# Patient Record
Sex: Male | Born: 1950 | Race: Black or African American | Hispanic: No | Marital: Married | Smoking: Current every day smoker
Health system: Southern US, Community
[De-identification: ages and names within clinical notes are randomized; demographics above are authoritative.]

## PROBLEM LIST (undated history)

## (undated) ENCOUNTER — Emergency Department (HOSPITAL_COMMUNITY)

## (undated) DIAGNOSIS — J45909 Unspecified asthma, uncomplicated: Secondary | ICD-10-CM

## (undated) DIAGNOSIS — E785 Hyperlipidemia, unspecified: Secondary | ICD-10-CM

## (undated) DIAGNOSIS — R001 Bradycardia, unspecified: Secondary | ICD-10-CM

## (undated) DIAGNOSIS — I10 Essential (primary) hypertension: Secondary | ICD-10-CM

## (undated) DIAGNOSIS — I2510 Atherosclerotic heart disease of native coronary artery without angina pectoris: Secondary | ICD-10-CM

## (undated) DIAGNOSIS — I214 Non-ST elevation (NSTEMI) myocardial infarction: Secondary | ICD-10-CM

## (undated) DIAGNOSIS — Z9289 Personal history of other medical treatment: Secondary | ICD-10-CM

## (undated) DIAGNOSIS — Z8249 Family history of ischemic heart disease and other diseases of the circulatory system: Secondary | ICD-10-CM

## (undated) DIAGNOSIS — Z720 Tobacco use: Secondary | ICD-10-CM

## (undated) DIAGNOSIS — G709 Myoneural disorder, unspecified: Secondary | ICD-10-CM

## (undated) DIAGNOSIS — I639 Cerebral infarction, unspecified: Secondary | ICD-10-CM

## (undated) HISTORY — DX: Tobacco use: Z72.0

## (undated) HISTORY — DX: Essential (primary) hypertension: I10

## (undated) HISTORY — DX: Personal history of other medical treatment: Z92.89

## (undated) HISTORY — DX: Hyperlipidemia, unspecified: E78.5

## (undated) HISTORY — DX: Non-ST elevation (NSTEMI) myocardial infarction: I21.4

## (undated) HISTORY — DX: Family history of ischemic heart disease and other diseases of the circulatory system: Z82.49

---

## 2000-07-04 ENCOUNTER — Encounter: Payer: Self-pay | Admitting: Internal Medicine

## 2000-07-04 ENCOUNTER — Ambulatory Visit (HOSPITAL_COMMUNITY): Admission: RE | Admit: 2000-07-04 | Discharge: 2000-07-04 | Payer: Self-pay | Admitting: Internal Medicine

## 2000-08-12 ENCOUNTER — Ambulatory Visit (HOSPITAL_COMMUNITY): Admission: RE | Admit: 2000-08-12 | Discharge: 2000-08-12 | Payer: Self-pay | Admitting: Internal Medicine

## 2000-08-12 ENCOUNTER — Encounter: Payer: Self-pay | Admitting: Internal Medicine

## 2001-04-12 HISTORY — PX: CARDIAC CATHETERIZATION: SHX172

## 2001-06-19 ENCOUNTER — Encounter: Payer: Self-pay | Admitting: Cardiovascular Disease

## 2001-06-19 ENCOUNTER — Ambulatory Visit (HOSPITAL_COMMUNITY): Admission: RE | Admit: 2001-06-19 | Discharge: 2001-06-20 | Payer: Self-pay | Admitting: Cardiovascular Disease

## 2001-06-19 HISTORY — PX: CORONARY ANGIOPLASTY WITH STENT PLACEMENT: SHX49

## 2001-09-15 ENCOUNTER — Encounter (HOSPITAL_COMMUNITY): Admission: RE | Admit: 2001-09-15 | Discharge: 2001-10-15 | Payer: Self-pay | Admitting: *Deleted

## 2001-09-22 ENCOUNTER — Encounter: Payer: Self-pay | Admitting: Emergency Medicine

## 2001-09-22 ENCOUNTER — Inpatient Hospital Stay (HOSPITAL_COMMUNITY): Admission: EM | Admit: 2001-09-22 | Discharge: 2001-09-23 | Payer: Self-pay | Admitting: Cardiovascular Disease

## 2001-09-23 HISTORY — PX: CARDIAC CATHETERIZATION: SHX172

## 2001-10-17 ENCOUNTER — Encounter (HOSPITAL_COMMUNITY): Admission: RE | Admit: 2001-10-17 | Discharge: 2001-11-16 | Payer: Self-pay | Admitting: *Deleted

## 2001-11-17 ENCOUNTER — Encounter (HOSPITAL_COMMUNITY): Admission: RE | Admit: 2001-11-17 | Discharge: 2001-12-17 | Payer: Self-pay | Admitting: *Deleted

## 2001-12-19 ENCOUNTER — Encounter (HOSPITAL_COMMUNITY): Admission: RE | Admit: 2001-12-19 | Discharge: 2002-01-18 | Payer: Self-pay | Admitting: *Deleted

## 2002-04-28 ENCOUNTER — Encounter: Payer: Self-pay | Admitting: *Deleted

## 2002-04-28 ENCOUNTER — Ambulatory Visit (HOSPITAL_COMMUNITY): Admission: RE | Admit: 2002-04-28 | Discharge: 2002-04-28 | Payer: Self-pay | Admitting: Pediatrics

## 2002-06-07 ENCOUNTER — Inpatient Hospital Stay (HOSPITAL_COMMUNITY): Admission: EM | Admit: 2002-06-07 | Discharge: 2002-06-09 | Payer: Self-pay | Admitting: Emergency Medicine

## 2002-06-08 ENCOUNTER — Encounter: Payer: Self-pay | Admitting: *Deleted

## 2002-09-17 HISTORY — PX: CARDIAC CATHETERIZATION: SHX172

## 2003-02-24 ENCOUNTER — Emergency Department (HOSPITAL_COMMUNITY): Admission: EM | Admit: 2003-02-24 | Discharge: 2003-02-24 | Payer: Self-pay | Admitting: Emergency Medicine

## 2003-05-04 ENCOUNTER — Ambulatory Visit (HOSPITAL_COMMUNITY): Admission: RE | Admit: 2003-05-04 | Discharge: 2003-05-04 | Payer: Self-pay | Admitting: Internal Medicine

## 2005-02-08 ENCOUNTER — Encounter (HOSPITAL_COMMUNITY): Admission: RE | Admit: 2005-02-08 | Discharge: 2005-02-08 | Payer: Self-pay | Admitting: Pediatrics

## 2006-03-29 HISTORY — PX: TRANSTHORACIC ECHOCARDIOGRAM: SHX275

## 2006-04-21 ENCOUNTER — Emergency Department (HOSPITAL_COMMUNITY): Admission: EM | Admit: 2006-04-21 | Discharge: 2006-04-21 | Payer: Self-pay | Admitting: Emergency Medicine

## 2007-06-17 ENCOUNTER — Emergency Department (HOSPITAL_COMMUNITY): Admission: EM | Admit: 2007-06-17 | Discharge: 2007-06-17 | Payer: Self-pay | Admitting: Emergency Medicine

## 2008-07-13 DIAGNOSIS — Z9289 Personal history of other medical treatment: Secondary | ICD-10-CM

## 2008-07-13 HISTORY — DX: Personal history of other medical treatment: Z92.89

## 2009-09-07 ENCOUNTER — Ambulatory Visit (HOSPITAL_COMMUNITY): Admission: RE | Admit: 2009-09-07 | Discharge: 2009-09-07 | Payer: Self-pay | Admitting: Cardiology

## 2009-09-09 ENCOUNTER — Ambulatory Visit (HOSPITAL_COMMUNITY): Admission: RE | Admit: 2009-09-09 | Discharge: 2009-09-09 | Payer: Self-pay | Admitting: Cardiovascular Disease

## 2009-09-09 HISTORY — PX: CARDIAC CATHETERIZATION: SHX172

## 2009-10-24 ENCOUNTER — Ambulatory Visit (HOSPITAL_COMMUNITY): Admission: RE | Admit: 2009-10-24 | Discharge: 2009-10-24 | Payer: Self-pay | Admitting: Internal Medicine

## 2010-06-30 NOTE — Cardiovascular Report (Signed)
[place]  Patient:    Robert, Shaw Visit Number: [id number] MRN: [id number]          Service Type: CAT Location: 6500 [id number] Attending Physician:  Virgina Evener Dictated by:   Lennette Bihari, M.D. Proc. Date: 06/19/01 Admit Date:  06/19/2001 Discharge Date: 06/20/2001   CC:         Netta Cedars, M.D.  Tesfaye D. Felecia Shelling, M.D., [place], [place]  [facility], [address] [address]   Cardiac Catheterization  INDICATIONS:  [name] is a 60 year old gentleman who had initially undergone cardiac catheterization in [place] in early March 2003.  He has subsequently been seen by Dr. Domingo Sep.  The patient was found to have multivessel disease but had high-grade circumflex as well as intermedius disease.  There was some initial plan to undergo Cardiolite imaging for further evaluation of ischemia.  The patient has developed recurrent episodes of chest pain despite increasing medications.  For this reason he is now referred for repeat catheterization and possible intervention.  DESCRIPTION OF PROCEDURE:  After premedication with Versed 2 mg initially intravenously, the patient was prepped and draped in the usual fashion.  The right femoral artery was punctured anteriorly and a #6 French sheath was inserted.  Diagnostic catheterization was done with #6 French Judkins #4 left and right coronary catheters.  A #6 French pigtail catheter was used for biplane cine left ventriculography.  With the demonstration of high-grade significant circumflex disease as well as diagonal/intermedius-like vessel with significant stenosis, despite disease in the LAD and RCA which was not felt to be real high-grade in those other vascular territories, the decision was made to attempt coronary intervention in the circumflex and intermedius/ diagonal-type vessel.  The arterial sheath was upgraded to a #7 [place]  system. Weight-adjusted heparin, 3500 units, was administered and the patient received double bolus Integrilin for glycoprotein IIb/IIIa inhibition therapy. Attention was first directed at the left circumflex coronary artery.  An FL-3.5 Voda guide was used.  A BMW wire was advanced down the circumflex vessel after documentation of therapeutic anticoagulation.  Primary stenting was done with a 3.0 x 15-mm Zeta stent successfully with dilatation up to 13-14 atmospheres.  Scouty angiography was then performed which revealed an excellent angiographic result.  Attention was then directed at the high diagonal/intermedius-type vessel which arose off the LAD.  This vessel immediately bifurcated into a small superior branch.  The superior branch had segmental 90% stenoses but was very small caliber.  The major portion of the intermedius/diagonal vessel was moderate sized and there was segmental disease proximally with stenosis up to 90% in the mid segment and 70% distally.  This vessel extended to the apex.  The wire was advanced down this main portion of the intermedius/diagonal-type vessel. Initial dilatation was done with a 2.5 x 15-mm CrossSail balloon, both at the mid site and also distally.  Stenting was then performed at the mid site with a 2.5 x 13-mm Pixel stent dilated to 13 atmospheres.  Scouty angiography confirmed an excellent angiographic result in this area.  Numerous attempts were then made at trying to navigate the wire into the superior branch which arose at a 90-degree angle immediately after the diagonal takeoff from the LAD.  Multiple wires were attempted and ultimately, this was not successfully navigated and consequently, dilatation was not done in this superior branch. It was felt that this was small caliber and the major diagonal conduit  was open.  The arterial sheath was then sutured in place.  ACT was documented to be therapeutic.  During the procedure, the patient did  receive several doses of intracoronary nitroglycerin.  He returned to his room in stable condition.  HEMODYNAMIC DATA: 1. Central aortic pressure 113/64. 2. Left ventricular pressure 113/16.  ANGIOGRAPHIC DATA: 1. The left main coronary artery was a large vessel that bifurcated into an    LAD and left circumflex system.  2. The LAD was a large caliber vessel.  In the proximal portion of the LAD,    the vessel gave rise to a first diagonal vessel.  This first diagonal    vessel had an intermedius-like distribution and immediately bifurcated into    a smaller sized superior branch just after the LAD takeoff and this arose    from a 90-degree angle.  The main diagonal vessel had mild narrowing    proximally and then was 90% stenosed in the mid segment with 70% stenosis    distally.  The superior branch had 90% stenosis followed by 70% stenosis.    Again, it was a small caliber vessel.  The second diagonal vessel was very    small caliber, approximately 1.5-mm size and had diffuse 60-70% eccentric    stenoses.  The mid LAD after the third diagonal vessel had 20% narrowing    followed by 30%.  There was 40-50% mid distal LAD narrowing.  The LAD    wrapped around the apex and had focal 90% stenosis in a very small    diminutive vessel.  3. The circumflex vessel was large caliber and had 90% stenosis proximally.    This vessel supplied 1 marginal vessel.  4. The right coronary artery was a moderate sized vessel that had 30-40%    proximal stenosis.  In the mid segment between 2 anterior RV marginal    branches, there did appear to be narrowing of up to 70%.  The right    coronary artery beyond the crux had 40% stenosis followed by another area    of 30% stenosis.  There was a very small continuation branch which seemed    to also supply the AV node and there was 80% focal stenosis in this small    distal branch after the small PDA takeoff.  5. Biplane cine left ventriculography revealed  preserved global LV function     with focal hypocontractility in the mid inferior wall and in the low to mid    posterolateral wall.  RESULTS:  Following successful two-vessel coronary intervention, the AV groove circumflex stenosis was reduced from 90% to 0% with the 3.0 x 13-mm Zeta stent; and in the main portion of the first diagonal vessel/intermedius distribution, the 90% stenosis following PTCA and stenting with a 2.5 x 13-mm Pixel stent was reduced to 0%.  The distal 70% stenosis where the vessel was small caliber was reduced to less than 30% by PTCA.  IMPRESSION: 1. Normal global left ventricular function with mild inferior to mid    posterolateral hypocontractility. 2. Multivessel coronary obstructive disease with the left anterior descending    artery giving rise to a first diagonal/intermedius-like distribution vessel    that had 90% mid stenosis and 70% distal stenosis as well as a superior    branch with segmental 90% and 70% stenosis; 60-70% eccentric stenosis in a    small diminutive second diagonal vessel; 20%, 30%, and 40% mid left    anterior descending artery stenosis with  50% mid distal stenosis, and 90%    apical left anterior descending artery stenosis; 90% focal proximal    circumflex stenosis on a bend in the vessel; 30-40% proximal right coronary    artery stenosis with focal 70% mid stenosis as well as 40% and 30% distal    stenoses with evidence for 80% stenosis in the diminutive small distal    branch. 3. Successful two-vessel coronary intervention with primary stenting of the    proximal AV groove circumflex utilizing a 3.0 x 15-mm Zeta stent with a 90%    stenosis being reduced to 0% and successful percutaneous transluminal    coronary angioplasty/stenting of the mid diagonal vessel (2.5 x 13-mm Pixel    stent) with the stenosis being reduced from 90% to 0%, percutaneous    transluminal coronary angioplasty of the distal intermedius vessel from    70% to  less than 30%, done with double bolus Integrilin/weight-adjusted    heparinization.Dictated by:   Lennette Bihari, M.D. Attending Physician:  Virgina Evener DD:  06/19/01 TD:  06/21/01 Job: [id number]

## 2010-06-30 NOTE — Discharge Summary (Signed)
[place]  Patient:    [name], [name] Visit Number: [id number] MRN: [id number]          Service Type: CAT Location: 6500 [id number] [id number] Attending Physician:  Virgina Evener Dictated by:   Leeanne Rio, P.A. Admit Date:  06/19/2001 Discharge Date: 06/20/2001   CC:         Robert Shaw, M.D.   Discharge Summary  Robert Shaw was admitted on Jun 19, 2001, for elective intervention of his coronary arteries.  He has known coronary artery disease and has been experiencing episodes of unstable angina two to three times a week and because of that was unable to return to his work and he was brought to the shortstay admission center for elective coronary angiography with intervention which was performed by Lennette Bihari, M.D.  PROCEDURE:  On Jun 19, 2001, cardiac catheterization with two vessel TCI was performed by Dr. Tresa Endo.  During this intervention, the patient had a Zeta stent placed to the circumflex artery with reduction of the proximal stenotic lesion from 90% to 0 and also he had a stent placed into the diagonal artery with lesion being reduced from 90% to 0%.  I do not have report of the cardiac catheterization and intervention available right now, but this data is from diagram sketched in the chart done by Dr. Tresa Endo.  The patient still has residual disease in his left anterior descending and disease in the RCA territory with scattered in the proximal area 30 to 40% stenosis, in the midportion 70% stenosis, and a small lesion with 80% stenosis in the distal RCA.  COMPLICATIONS:  None.  CONSULTING PHYSICIANS:  None.  LABORATORY DATA:  BMP showed sodium 138, potassium 3.8, chloride 105, CO2 26, glucose 113, BUN 7, creatinine 1.1.  CBC showed white blood cell count 8.4, hematocrit 41.2, and hemoglobin 14.4 with platelet count 182.  The patient discharged home the next day in stable condition.  Groin site stable with no complications.   He was ambulating with no difficulty.  DISCHARGE DIAGNOSES: 1. Coronary artery disease, status post intervention to circumflex and    diagonal arteries.  For a more detailed report of this procedure, please    refer to the dictation of that day which is not available to me right now. 2. Hypertension, well controlled. 3. Hyperlipidemia.  DISCHARGE MEDICATIONS: 1. Enteric-coated aspirin 81 mg q.d. 2. Imdur 30 mg q.d. 3. Atenolol 25 mg q.d. 4. Altace 2.5 mg q.d. 5. Plavix 75 mg q.d. 6. Zocor 40 mg q.d. 7. Nitroglycerin 0.4 mg p.r.n.  ACTIVITY:  No driving, no lifting greater than 5 pounds, no strenuous activity for three days.  DIET:  Low fat, low cholesterol diet.  DISCHARGE INSTRUCTIONS:  The patient was allowed to shower and instructed to report any bleeding, swelling, or oozing from the groin puncture site to our office, number was provided.  FOLLOW-UP:   Stress Cardiolite test will be scheduled in our office on June 19, at 9:15 to assess the ischemia in the RCA territory.  Follow-up appointment with Dr. Domingo Sep was scheduled on May 29, at 3:15 in [place], [place]. Dictated by:   Leeanne Rio, P.A. Attending Physician:  Virgina Evener DD:  06/20/01 TD:  06/23/01 Job: [id number]

## 2010-06-30 NOTE — Procedures (Signed)
NAME:  Robert Shaw, Robert Shaw NO.:  [id number]   MEDICAL RECORD NO.:  [id number]          PATIENT TYPE:  OUT   LOCATION:  RAD                           FACILITY:  [facility]   PHYSICIAN:  Dani Gobble, MD       DATE OF BIRTH:  Jul 25, 1950   DATE OF PROCEDURE:  DATE OF DISCHARGE:                                    STRESS TEST   INDICATIONS:  Known CAD.   ELECTROCARDIOGRAM/HEMODYNAMIC DATA:  Baseline blood pressure 132/78 mmHg.  The pulse was 58 beats per minute.  __________ 12-lead EKG revealed sinus  bradycardia with biphasic P-waves in V3 through V4.  The patient walked for  8 minutes 8 seconds on a full Bruce protocol.  He did not quite achieve his  target heart rate but was quite close with his projected heart rate at 142  beats per minute and peak heart rate 141 beats per minute.  Peak blood  pressure 178/80 mmHg.  EKG at peak heart rate reveals sinus tachycardia with  scooping 1/2-mm ST depression in the inferior leads and similarly in V3  through V5.  Heart rate during recovery decreased quite rapidly.  The ST-  changes required approximately 2-3 minutes for return to baseline.  Of note,  he had pseudonormalization of his T-waves in V3 through V4 with exercise.  He experienced no chest discomfort during the procedure but did note  shortness of breath, dyspnea on exertion and lower extremity discomfort  somewhat suggestive of claudication.   IMPRESSION:  1.  Clinically negative for angina.  2.  EKG negative for ischemia.  3.  Scintigraphic images are pending.  4.  Borderline hypertensive response to exercise.           ______________________________  Dani Gobble, MD     AB/MEDQ  D:  02/08/2005  T:  02/08/2005  Job:  [id number]   cc:   Tesfaye D. Felecia Shelling, MD  Fax: [phone]

## 2010-06-30 NOTE — Cardiovascular Report (Signed)
NAME:  Robert Shaw, Robert Shaw                          ACCOUNT NO.:  [id number]   MEDICAL RECORD NO.:  [id number]                   PATIENT TYPE:  INP   LOCATION:  2905                                 FACILITY:  [facility]   PHYSICIAN:  Cristy Hilts. Jacinto Halim, M.D.                  DATE OF BIRTH:  06-24-1950   DATE OF PROCEDURE:  DATE OF DISCHARGE:  09/23/2001                              CARDIAC CATHETERIZATION   PROCEDURE PERFORMED:  1. Left ventriculography.  2. Selective right and left coronary arteriography.   INDICATIONS:  The patient is a 60 year old gentleman with a history of  coronary artery disease, status post PTCA and stenting of the circumflex and  diagonal #1/intermediate coronary artery. On Jun 19, 2001, he was admitted  through the emergency room with chest pain suggestion of progressive angina  pectoris. Given his severe coronary artery disease, he was brought to the  cardiac catheterization laboratory to evaluate his coronary anatomy.   HEMODYNAMIC DATA:  1. The left ventricular pressures 110 with an end-diastolic pressure of 12     mmHg.  2. The aortic pressure 100/61 with a mean of 77 mmHg.  There was no pressure     gradient across the aortic valve.   LEFT VENTRICULOGRAM:  The left ventricular systolic function was normal and  EF was estimated at 55-60%.  There was no mitral regurgitation.   CORONARY ANGIOGRAPHY:  1. Right coronary artery:  The right coronary artery is a large caliber     vessel and a dominant vessel. The mid segment has about 50% stenosis. The     right coronary artery bifurcates into PDA and a smaller PLV branch. The     smaller PLV branch has ostial 60-70% stenosis.  2. Left main coronary artery:  The left main coronary artery is a large     caliber vessel. It is normal.  3. Circumflex coronary artery:  The circumflex coronary artery is a large     caliber vessel. In the mid segment, the previously placed 2.0 x 15 mm     (Jun 19, 2001) stent is widely  patent. Otherwise no significant stenosis     is noted in the circumflex coronary artery.  4. Left anterior descending artery:  Left anterior descending artery is a     large caliber vessel in the proximal segment. It gives origin to a very     large diagonal #1. Previously placed diagonal #1 stent in the mid segment     is widely patent. This is a 2.5 x 13 mm stent inserted on Jun 19, 2001.  5. The diagonal #2, diagonal #3, and diagonal #4 vessels, which are very     small 1.0 to 1.5 mm vessels have severe diffuse disease.  The diagonal #2     has 80-90% mid segment stenosis and a secondary branch which is diagonal     #2  has mid 80% stenosis. The diagonal #3 has ostial 60-70% stenosis.  6. The distal LAD has 99% stenosis, however, the vessel size here is only     about 1.0 to 1.5 mmHg.   IMPRESSION:  1. 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 No significant     change in the coronary anatomy.  2. Widely patent stents which were placed on Jun 19, 2001.   RECOMMENDATIONS:  Continue medical therapy advise. Continue risk factor  modification as indicated. Smoking cessation is indicated. His nitroglycerin  will be increased. If he continues to have recurrent angina, [facility] as an  outpatient can be considered.   TECHNIQUE OF PROCEDURE:  Under the usual sterile precautions, using a 6  French right femoral artery access,  6 [place] multipurpose B2 catheter was  advanced into the ascending aorta over a 0.035 mm J wire. The catheter was  gently advanced in the left ventricle and left ventricular pressures were  monitored. Hand contrast injection of the left ventricular was performed,  both in LAO and RAO projections. Then the catheter crushed and then pulled  back into the ascending aorta and pressure gradient the aortic valve was  monitored.  The right coronary artery was selectively engaged and  angiography was performed.  Then the catheter was pulled out of the body  over the  0.035 inch J wire. A 6 French Judkins left diagnostic catheter was  advanced down the ascending aorta over a 0.035 inch J wire. The left main  coronary artery was selectively engaged and angiography was performed. After  performing the angiograms, the angiograms were carefully reviewed. Then the  catheter was pulled out of the body.  The patient was transferred to the  recovery room in stable condition. The patient tolerated the procedure well.                                                  Cristy Hilts. Jacinto Halim, M.D.    Pilar Plate  D:  09/22/2001  T:  09/26/2001  Job:  [id number]   cc:   Sherral Hammers, M.D.   Tesfaye D. Felecia Shelling, M.D.   [facility]

## 2010-06-30 NOTE — H&P (Signed)
NAME:  Robert Shaw, Robert Shaw                          ACCOUNT NO.:  [id number]   MEDICAL RECORD NO.:  [id number]                   PATIENT TYPE:  INP   LOCATION:  2905                                 FACILITY:  [facility]   PHYSICIAN:  Abelino Derrick, P.A.                DATE OF BIRTH:  29-Apr-1950   DATE OF ADMISSION:  09/22/2001  DATE OF DISCHARGE:  09/23/2001                                HISTORY & PHYSICAL   CHIEF COMPLAINT:  Chest pain.   HISTORY OF PRESENT ILLNESS:  The patient is a 60 year old with a history of  coronary artery disease. He is followed by Dr. Felecia Shelling in [place] and Dr.  Domingo Sep. He had a DMI while in [place] in March of 2003. He was  cathed then and treated medically secondary to small culprit vessels. He had  recurrent chest pain and was cathed by Dr. Tresa Endo in May of 2003 and at that  time, had a ramus intermedius and circumflex Stent. Follow-up Cardiolite  study done on July 31, 2001 was negative for ischemia with an EF of 61%. He  has been doing well, going to rehabilitative services in [place]. He has  noticed some increasing grabbing left chest pain for the last few weeks.  Symptoms are not exertional. He has no associated nausea, vomiting, or  diaphoresis but does have some associated right arm pain. He did have some  discomfort today at cardiac rehabilitation and was sent to [facility]  emergency room. He is transferred now for further evaluation. He was treated  at [facility] with IV Heparin and Nitroglycerin.   PAST MEDICAL HISTORY:  Remarkable for hyperlipidemia, hypertension, and  polyps.   CURRENT MEDICATIONS:  1. Zocor 20 mg per day.  2. Aspirin daily.  3. Voltex daily.  4. Imdur 30 mg daily.  5. Plavix 75 mg daily.  6. Zyban daily.  7. Atenolol 25 mg daily.   ALLERGIES:  No known drug allergies.   SOCIAL HISTORY:  He is married. He sells life insurance for a living. He has  not worked since he had his heart attach in March. He has one  child and  three grandchildren. Smokes less than one half pack per day.   FAMILY HISTORY:  Remarkable for coronary artery disease. His father had an  myocardial infarction in his 77's and died in his 53's. He has two brothers  with coronary artery disease, one in his 86's and one in his 28's.   REVIEW OF SYSTEMS:  Negative for chills, fever, peptic ulcer disease, or  previous GI bleeding.   PHYSICAL EXAMINATION:  VITAL SIGNS: Blood pressure 112/72, pulse 54,  temperature 96.9.  GENERAL: A well developed, well nourished  male in no acute distress.  HEENT: Normocephalic. Extraocular muscles intact. Sclera nonicteric.  Conjunctiva within normal limits.  NECK: Without bruits or jugular venous distention.  CHEST: Feels some rhonchi on the  right, otherwise clear.  CARDIAC: Regular rate and rhythm.  No murmur, rub, or gallop. Normal S1 and  S2. Positive S4.  ABDOMEN: Nontender. No hepatosplenomegaly.  EXTREMITIES: Without edema. Pulses are 3+/4 bilaterally.  NEURO: Examination is grossly intact. He is awake, alert, and oriented.  Cooperative.  SKIN: Warm and dry.   IMPRESSION:  1. Atypical chest pain, rule out progression of coronary artery disease.  2. Known coronary artery disease, two vessel PCI in March of 2003 with     subsequent negative Cardiolite study.  3. History of smoking.  4. Hypertension, currently controlled.  5. Treated hyperlipidemia.  6. Family history of coronary artery disease.   PLAN:  The patient was seen by Dr. Nadara Eaton and myself today. He will be set up  for diagnostic catheterization this afternoon.                                                Abelino Derrick, P.A.    LKK/MEDQ  D:  09/22/2001  T:  09/25/2001  Job:  [id number]

## 2010-06-30 NOTE — Discharge Summary (Signed)
NAME:  Robert Shaw, Robert Shaw                          ACCOUNT NO.:  [id number]   MEDICAL RECORD NO.:  [id number]                   PATIENT TYPE:  INP   LOCATION:  2905                                 FACILITY:  [facility]   PHYSICIAN:  Runell Gess, M.D.             DATE OF BIRTH:  1950/11/06   DATE OF ADMISSION:  09/22/2001  DATE OF DISCHARGE:  09/23/2001                                 DISCHARGE SUMMARY   DISCHARGE DIAGNOSES:  1. Unstable angina.  2. Coronary disease, catheterization this admission showing moderate small     vessel disease.  Plan is for continued medical therapy.  3. Good left ventricular function.  4. Known coronary disease with VMI and two vessel PCI April 2003.  5. Controlled hypertension.  6. Treated hyperlipidemia.  7. History of smoking.   HOSPITAL COURSE:  The patient is a 60 year old male followed by Dr. Felecia Shelling  and Dr. Domingo Sep in [place].  He has a history of coronary disease and  had a BMI while at [place] in March of 2003.  He was cathed then and  treated medically secondary to small culprit vessels.  He had recurrent  chest pain and was cathed in May 2003 by Dr. Tresa Endo and at that time had a  circumflex and ramus intermedius stent placed.  Cardiolite study July 31, 2001 was negative for ischemia with an EF of 61%.  He has been going to  rehab in [place] and doing well.  On the day of admission he noted a  grabbing left chest pain and presented to the ER at [facility].  He was  sent there from cardiac rehab in [place].  He was transferred to [place] for  further evaluation.  CK MB and troponins were negative.  Catheterization was  done later on the 11th which revealed 50% mid distal RCA, 70% distal RCA,  left main was normal, circumflex stent site was patent, He had a 99% distal  LAD lesion and previously placed diagonal stent was patent.  He  had an 80-  90% second diagonal narrowing.  EF was 55%.  The plan is for continued  medical therapy.   We increased his nitrates.  He tolerated catheterization  well.  We feel he can be discharged 09/23/01.   DISCHARGE MEDICATIONS:  1. Imdur 60 mg q.d.  2. Coated aspirin q.d.  3. Plavix 75 mg q.d.  4. Folatex once a day.  5. Atenolol 25 mg q.d.  6. Zoloft 50 mg q.d.  7. Zyban 150 mg b.i.d.  8. Nitroglycerin sublingual p.r.n.   LABS:  Renal profile shows a sodium of 136, potassium 3.8, BUN 11,  creatinine 1.2.  CK MB and troponin are negative.  INR is 1.1.  White count  6.3, hemoglobin 13.2, hematocrit 38.3, platelet count 181.  EKG showed sinus  rhythm, sinus bradycardia, without acute changes.    DISPOSITION:  The patient is  discharged in stable condition and will follow  up with Dr. Domingo Sep.  He was counseled on smoking before discharge.  There  was a question of whether or not we should do another Cardiolite study but  he has just had one done in June that was negative for ischemia.     Abelino Derrick, P.A.                      Runell Gess, M.D.    Lenard Lance  D:  09/23/2001  T:  09/26/2001  Job:  [id number]

## 2010-06-30 NOTE — Discharge Summary (Signed)
   NAME:  Robert Shaw, Robert Shaw                          ACCOUNT NO.:  [id number]   MEDICAL RECORD NO.:  [id number]                   PATIENT TYPE:  INP   LOCATION:  A217                                 FACILITY:  [facility]   PHYSICIAN:  Tesfaye D. Felecia Shelling, M.D.              DATE OF BIRTH:  24-Jul-1950   DATE OF ADMISSION:  06/07/2002  DATE OF DISCHARGE:  06/07/2002                                 DISCHARGE SUMMARY   DISCHARGE DIAGNOSES:  1. Syncopal episode secondary to bradycardia as a result of beta blocker.  2. History of coronary artery disease and status post myocardial infarction.  3. Hypertension.  4. Hyperlipidemia.  5. History of depression disorder.   DISCHARGE MEDICATIONS:  1. Imdur 30 mg p.o. daily.  2. Aspirin 81 mg p.o. daily.  3. Plavix 75 mg p.o. daily.  4. ________ one tablet p.o. daily.  5. Nitroglycerin sublingual 0.4 mg every 5 minutes p.r.n. for chest pain.   HOSPITAL COURSE:  This is a 60 year old black male with a history of  coronary artery disease and a myocardial infarction and hypertension who  came to the emergency room due to dizziness and a brief period of syncope.  He was found to have bradycardia and hypertension on initial evaluation in  the emergency room.  There was no change in his EKG and the cardiac enzymes.  He was admitted under telemetry and was evaluated.  His beta blocker was  stopped.  His Coumadin was also decreased.  Patient was evaluated by his  cardiologist, and he was discharged back home in a stable condition to be  followed as an outpatient.                                               Tesfaye D. Felecia Shelling, M.D.    TDF/MEDQ  D:  06/18/2002  T:  06/18/2002  Job:  [id number]

## 2010-06-30 NOTE — H&P (Signed)
NAME:  Robert Shaw, Robert Shaw                          ACCOUNT NO.:  [id number]   MEDICAL RECORD NO.:  [id number]                   PATIENT TYPE:  INP   LOCATION:  A217                                 FACILITY:  [facility]   PHYSICIAN:  Tesfaye D. Felecia Shelling, M.D.              DATE OF BIRTH:  1950/05/07   DATE OF ADMISSION:  06/07/2002  DATE OF DISCHARGE:                                HISTORY & PHYSICAL   CHIEF COMPLAINT:  Dizziness.   HISTORY OF PRESENT ILLNESS:  This is a 60 year old male patient with history  of coronary artery disease and status post myocardial infarction and  hypertension.  He came to the emergency room with the above complaints.  The  patient has been having dizziness whenever he was trying to get up out of  bed.  At one point he was almost passed out.  The day before these symptoms  he was having recurrent chest pain.  There was no nausea or vomiting. The  patient was evaluated in the emergency room where he was found to have  orthostatic hypertension and bradycardia. His cardiac enzymes and EKGs were  negative for any acute change.  The the patient was given a bolus of fluid  and then he was admitted into telemetry.   REVIEW OF SYSTEMS:  No headache, fever, shortness of breath, palpitations,  nausea, vomiting, abdominal pain, dysuria, urgency, or frequency of  urination.   PAST MEDICAL HISTORY:  1. Coronary artery disease and status post MI.  2. Hypertension.  3. Hyperlipidemia.  4. History of depression disorder.   CURRENT MEDICATIONS:  1. Atenolol 25 mg p.o. daily.  2. Imdur 90 mg p.o. daily.  3. Ecotrin 81 mg p.o. daily.  4. Zocor 40 mg p.o. daily.  5. Plavix 75 mg p.o. daily.  6. Fortex 1 tablet p.o. daily.   PERSONAL AND SOCIAL HISTORY:  The patient is married.  He has stopped  smoking in the last 6 months time.  No history of alcohol or substance  abuse.   PHYSICAL EXAMINATION:  GENERAL:  The patient is alert, awake, and  chronically sick looking.  VITAL  SIGNS:  Blood pressure 83/47, pulse 43, respiratory rate 18,  temperature 97.7 degrees F.  HEENT:  Pupils are equal and reactive.  NECK:  Supple.  CHEST:  Clear lung fields with good air entry.  CARDIOVASCULAR:  First and second heart sounds heard, bradycardic.  No  masses noted or organomegaly.  EXTREMITIES:  No leg edema.   LABORATORY DATA:  Labs on admission, WBC 7.8, hemoglobin 13.4, hematocrit  33.6, and platelets 177.  Sodium 138, potassium 4.2, chloride 107, CO2 27,  glucose 103, BUN 9, creatinine 1.1, calcium 8.9, total CPK 185.  CK/MB 1.9,  and troponin 0.01.   ASSESSMENT:  1. Presyncopal episode probably secondary to a combination of orthostatic     hypotension and bradycardia which could be secondary to atenolol.  2. History of coronary artery disease status post myocardial infarction.  3. Hypertension which is well controlled.  4. History of hyperlipidemia.  5. History of depression disorder.   PLAN:  We will give the patient a bolus of fluid. We will continue him on IV  fluid. We will do cardiac enzymes and EKG.  Then we will need further  cardiology evaluation.                                               Tesfaye D. Felecia Shelling, M.D.    TDF/MEDQ  D:  06/08/2002  T:  06/08/2002  Job:  [id number]

## 2010-11-08 LAB — DIFFERENTIAL
Eosinophils Absolute: 0
Lymphocytes Relative: 22
Lymphs Abs: 1.6
Monocytes Relative: 3
Neutrophils Relative %: 74

## 2010-11-08 LAB — RAPID URINE DRUG SCREEN, HOSP PERFORMED
Amphetamines: NOT DETECTED
Barbiturates: NOT DETECTED
Benzodiazepines: NOT DETECTED
Opiates: NOT DETECTED

## 2010-11-08 LAB — PROTIME-INR: INR: 1

## 2010-11-08 LAB — CBC
MCHC: 35.3
RDW: 13.4

## 2010-11-08 LAB — COMPREHENSIVE METABOLIC PANEL
ALT: 19
Calcium: 9
Creatinine, Ser: 1.07
GFR calc Af Amer: >60
Glucose, Bld: 107 — ABNORMAL HIGH
Sodium: 139
Total Protein: 6.3

## 2010-11-08 LAB — URINALYSIS, ROUTINE W REFLEX MICROSCOPIC
Glucose, UA: NEGATIVE
Hgb urine dipstick: NEGATIVE
Specific Gravity, Urine: 1.01
pH: 7

## 2010-11-08 LAB — APTT: aPTT: 30

## 2012-03-14 ENCOUNTER — Encounter (HOSPITAL_COMMUNITY): Payer: Self-pay | Admitting: *Deleted

## 2012-03-14 ENCOUNTER — Emergency Department (HOSPITAL_COMMUNITY): Payer: 59

## 2012-03-14 ENCOUNTER — Inpatient Hospital Stay (HOSPITAL_COMMUNITY)
Admission: EM | Admit: 2012-03-14 | Discharge: 2012-03-18 | DRG: 247 | Disposition: A | Discharge status: Home / Self Care | Payer: 59 | Attending: Internal Medicine | Admitting: Internal Medicine

## 2012-03-14 DIAGNOSIS — Z7982 Long term (current) use of aspirin: Secondary | ICD-10-CM

## 2012-03-14 DIAGNOSIS — R079 Chest pain, unspecified: Secondary | ICD-10-CM

## 2012-03-14 DIAGNOSIS — F17200 Nicotine dependence, unspecified, uncomplicated: Secondary | ICD-10-CM | POA: Diagnosis present

## 2012-03-14 DIAGNOSIS — I214 Non-ST elevation (NSTEMI) myocardial infarction: Principal | ICD-10-CM | POA: Diagnosis present

## 2012-03-14 DIAGNOSIS — Z7902 Long term (current) use of antithrombotics/antiplatelets: Secondary | ICD-10-CM

## 2012-03-14 DIAGNOSIS — Z79899 Other long term (current) drug therapy: Secondary | ICD-10-CM

## 2012-03-14 DIAGNOSIS — I252 Old myocardial infarction: Secondary | ICD-10-CM

## 2012-03-14 DIAGNOSIS — N182 Chronic kidney disease, stage 2 (mild): Secondary | ICD-10-CM | POA: Diagnosis present

## 2012-03-14 DIAGNOSIS — Z9861 Coronary angioplasty status: Secondary | ICD-10-CM

## 2012-03-14 DIAGNOSIS — I2510 Atherosclerotic heart disease of native coronary artery without angina pectoris: Secondary | ICD-10-CM | POA: Diagnosis present

## 2012-03-14 DIAGNOSIS — I498 Other specified cardiac arrhythmias: Secondary | ICD-10-CM | POA: Diagnosis not present

## 2012-03-14 DIAGNOSIS — R001 Bradycardia, unspecified: Secondary | ICD-10-CM | POA: Diagnosis not present

## 2012-03-14 HISTORY — DX: Bradycardia, unspecified: R00.1

## 2012-03-14 HISTORY — DX: Atherosclerotic heart disease of native coronary artery without angina pectoris: I25.10

## 2012-03-14 LAB — CBC WITH DIFFERENTIAL/PLATELET
Hemoglobin: 13.8 g/dL (ref 13.0–17.0)
Lymphocytes Relative: 41 % (ref 12–46)
Lymphs Abs: 2.7 10*3/uL (ref 0.7–4.0)
MCH: 31.8 pg (ref 26.0–34.0)
Monocytes Relative: 5 % (ref 3–12)
Neutro Abs: 3.4 10*3/uL (ref 1.7–7.7)
Neutrophils Relative %: 52 % (ref 43–77)
Platelets: 167 10*3/uL (ref 150–400)
RBC: 4.34 MIL/uL (ref 4.22–5.81)
WBC: 6.6 10*3/uL (ref 4.0–10.5)

## 2012-03-14 LAB — COMPREHENSIVE METABOLIC PANEL
ALT: 13 U/L (ref 0–53)
Alkaline Phosphatase: 74 U/L (ref 39–117)
BUN: 14 mg/dL (ref 6–23)
CO2: 27 mEq/L (ref 19–32)
Chloride: 103 mEq/L (ref 96–112)
GFR calc Af Amer: 64 mL/min — ABNORMAL LOW (ref >90)
Glucose, Bld: 113 mg/dL — ABNORMAL HIGH (ref 70–99)
Potassium: 3.5 mEq/L (ref 3.5–5.1)
Sodium: 140 mEq/L (ref 135–145)
Total Bilirubin: 0.7 mg/dL (ref 0.3–1.2)

## 2012-03-14 LAB — POCT I-STAT TROPONIN I: Troponin i, poc: 0.1 ng/mL (ref 0.00–0.08)

## 2012-03-14 MED ORDER — ASPIRIN 81 MG PO CHEW
243.0000 mg | CHEWABLE_TABLET | Freq: Once | ORAL | Status: AC
  Administered 2012-03-14: 243 mg via ORAL

## 2012-03-14 MED ORDER — ASPIRIN 81 MG PO CHEW
CHEWABLE_TABLET | ORAL | Status: AC
  Administered 2012-03-14: 243 mg via ORAL
  Filled 2012-03-14: qty 3

## 2012-03-14 MED ORDER — NITROGLYCERIN 0.4 MG SL SUBL
SUBLINGUAL_TABLET | SUBLINGUAL | Status: AC
  Administered 2012-03-14: 0.4 mg
  Filled 2012-03-14: qty 25

## 2012-03-14 MED ORDER — NITROGLYCERIN 2 % TD OINT
0.5000 [in_us] | TOPICAL_OINTMENT | Freq: Four times a day (QID) | TRANSDERMAL | Status: DC
  Administered 2012-03-14 – 2012-03-16 (×8): 0.5 [in_us] via TOPICAL
  Filled 2012-03-14: qty 1
  Filled 2012-03-14: qty 30

## 2012-03-14 NOTE — ED Provider Notes (Signed)
History   Scribed for Carleene Cooper III, MD, the patient was seen in room APA16A/APA16A. This chart was scribed by Lewanda Rife, ED scribe. Patient's care was started at 8:38 pm.   CSN: [id number]  Arrival date & time 03/14/12  2009   First MD Initiated Contact with Patient 03/14/12 2033      Chief Complaint  Patient presents with  . Chest Pain    (Consider location/radiation/quality/duration/timing/severity/associated sxs/prior treatment) HPI Robert Shaw is a 62 y.o. male who presents to the Emergency Department complaining of waxing and waning moderate chest pain radiating down both arms onset this morning. Pt reports 2 episodes of chest pain, the first episode resolved on its own, and second episode started 1 hour prior to arrival. Pt denies a precipitating factor. Pt reports shortness of breath, and "throat tightening." Pt denies fever, change in bowel movements, urinary symptoms, otalgia, sore throat, cough, syncope, nausea, vomiting, and diarrhea. Pt reports taking 1 aspirin today at home with no relief. Pt states nitroglycerin administered in ED has greatly improved symptoms. Pt states nothing makes chest pain worse. Pt reports history stent placement, myocardial infarction, and coronary artery disease. Pt's last cardiac catheterization was on 09/09/09 and demonstrated previously placed stents were widely patent with no significant coronary obstructive disease. Pt was advised to cardiologist to quit smoking, but admits he still smokes cigarettes today. Pt reports family history of coronary artery disease.  Cardiologist: Dr. Rennis Golden  Past Medical History  Diagnosis Date  . Myocardial infarction     Past Surgical History  Procedure Date  . Cardiac stents     History reviewed. No pertinent family history.  History  Substance Use Topics  . Smoking status: Current Every Day Smoker  . Smokeless tobacco: Not on file  . Alcohol Use: No      Review of Systems   Constitutional: Negative.  Negative for fever and diaphoresis.  HENT: Negative.   Respiratory: Positive for shortness of breath. Negative for cough.   Cardiovascular: Positive for chest pain.  Gastrointestinal: Negative.   Musculoskeletal: Negative.   Skin: Negative.   Neurological: Negative.   Hematological: Negative.   Psychiatric/Behavioral: Negative.   All other systems reviewed and are negative.   A complete 10 system review of systems was obtained and all systems are negative except as noted in the HPI and PMH.   Allergies  Review of patient's allergies indicates no known allergies.  Home Medications  No current outpatient prescriptions on file.  BP 180/85  Pulse 65  Temp 97.5 F (36.4 C) (Oral)  Resp 20  Ht 6' (1.829 m)  Wt 168 lb (76.204 kg)  BMI 22.78 kg/m2  SpO2 100%  Physical Exam  Nursing note and vitals reviewed. Constitutional: He is oriented to person, place, and time. He appears well-developed and well-nourished. No distress.  HENT:  Head: Normocephalic and atraumatic.  Eyes: EOM are normal.  Neck: Normal range of motion. Neck supple. No tracheal deviation present.  Cardiovascular: Normal rate, regular rhythm, normal heart sounds and intact distal pulses.   Pulmonary/Chest: Effort normal and breath sounds normal. No respiratory distress.  Abdominal: Soft. Bowel sounds are normal. He exhibits no distension and no mass. There is no tenderness. There is no rebound and no guarding.  Musculoskeletal: Normal range of motion.  Neurological: He is alert and oriented to person, place, and time.       neurolofic   Skin: Skin is warm and dry.  Psychiatric: He has a normal mood and affect.  His behavior is normal.    ED Course  Procedures (including critical care time) 8:45 PM Pt informed and agrees with treatment plan. Pt and family also informed of possible admission.   Medications  aspirin chewable tablet 243 mg (not administered)  aspirin 81 MG chewable  tablet (243 mg  Given 03/14/12 2021)  nitroGLYCERIN (NITROSTAT) 0.4 MG SL tablet (0.4 mg  Given 03/14/12 2022)    9:28 PM Results for orders placed during the hospital encounter of 03/14/12  CBC WITH DIFFERENTIAL      Component Value Range   WBC 6.6  4.0 - 10.5 K/uL   RBC 4.34  4.22 - 5.81 MIL/uL   Hemoglobin 13.8  13.0 - 17.0 g/dL   HCT 95.6 (*) 21.3 - 08.6 %   MCV 88.9  78.0 - 100.0 fL   MCH 31.8  26.0 - 34.0 pg   MCHC 35.8  30.0 - 36.0 g/dL   RDW 57.8  46.9 - 62.9 %   Platelets 167  150 - 400 K/uL   Neutrophils Relative 52  43 - 77 %   Neutro Abs 3.4  1.7 - 7.7 K/uL   Lymphocytes Relative 41  12 - 46 %   Lymphs Abs 2.7  0.7 - 4.0 K/uL   Monocytes Relative 5  3 - 12 %   Monocytes Absolute 0.4  0.1 - 1.0 K/uL   Eosinophils Relative 1  0 - 5 %   Eosinophils Absolute 0.1  0.0 - 0.7 K/uL   Basophils Relative 0  0 - 1 %   Basophils Absolute 0.0  0.0 - 0.1 K/uL  COMPREHENSIVE METABOLIC PANEL      Component Value Range   Sodium 140  135 - 145 mEq/L   Potassium 3.5  3.5 - 5.1 mEq/L   Chloride 103  96 - 112 mEq/L   CO2 27  19 - 32 mEq/L   Glucose, Bld 113 (*) 70 - 99 mg/dL   BUN 14  6 - 23 mg/dL   Creatinine, Ser 5.28 (*) 0.50 - 1.35 mg/dL   Calcium 9.8  8.4 - 41.3 mg/dL   Total Protein 7.2  6.0 - 8.3 g/dL   Albumin 4.2  3.5 - 5.2 g/dL   AST 23  0 - 37 U/L   ALT 13  0 - 53 U/L   Alkaline Phosphatase 74  39 - 117 U/L   Total Bilirubin 0.7  0.3 - 1.2 mg/dL   GFR calc non Af Amer 55 (*) >90 mL/min   GFR calc Af Amer 64 (*) >90 mL/min  POCT I-STAT TROPONIN I      Component Value Range   Troponin i, poc 0.10 (*) 0.00 - 0.08 ng/mL   Comment 3            Dg Chest Portable 1 View  03/14/2012  *RADIOLOGY REPORT*  Clinical Data: Mid to left chest pain.  PORTABLE CHEST - 1 VIEW  Comparison: 09/07/2009  Findings: Normal heart size and pulmonary vascularity.  Slightly shallow inspiration with underlying appearance of hyperinflation, likely due to emphysema. Scattered fibrosis in the  perihilar regions.  Linear atelectasis in the left lung base.  No focal consolidation.  No blunting of costophrenic angles.  No pneumothorax.  Mediastinal contours appear intact.  IMPRESSION: Chronic emphysematous changes and scattered fibrosis.  Linear atelectasis in the left base.  No focal consolidation.   Original Report Authenticated By: Burman Nieves, M.D.     Date: 03/14/2012  Rate: 64  Rhythm:  normal sinus rhythm  QRS Axis: normal  Intervals: normal QRS:  Poor R wave progression in precordial leads suggests old anterior myocardial infarction.  ST/T Wave abnormalities: normal  Conduction Disutrbances:none  Narrative Interpretation: Abnormal EKG  Old EKG Reviewed: unchanged  9:36 PM Pt is chest pain free after sublingual NTG.  Lab workup shows slightly elevated troponin I at 0.10.  Call to Dr. Osvaldo Shipper, [facility] --> refers me to pt's cardiologist.  Call to [facility] --> Dr. Royann Shivers --> [facility] at [facility] --> Dr. Adela Glimpse --> Dr. Osvaldo Shipper, who will see and transfer pt to [facility].       1. Chest pain    I personally performed the services described in this documentation, which was scribed in my presence. The recorded information has been reviewed and is accurate. Osvaldo Human, MD         Carleene Cooper III, MD 03/14/12 [id number]

## 2012-03-14 NOTE — H&P (Signed)
[facility] History and Physical  Robert Shaw:[id number] DOB: 05-07-1950 DOA: 03/14/2012   PCP: Avon Gully, MD  Specialists: Dr. Rennis Golden is his cardiologist  Chief Complaint: Chest pain since earlier tonight   HPI: Robert Shaw is a 62 y.o. male with a past medical history of coronary artery disease, who has had stents placed to the circumflex and diagonal in 2003. He had a cardiac catheterization in 2011, which showed patent stents with other CAD. He was in his usual state of health till 8 PM tonight when he was in his house doing nonexertional activity. He started developing pressure-like sensation in the retrosternal area. The achiness radiated to both the arms. He had shortness of breath but no dizziness. The pain was 8/10 in intensity. Denies any cough. And denies any palpitations, or diaphoresis. Denies any leg swelling. Denies any acid reflux. He took aspirin and cholesterol medications at home, but did not take any nitroglycerin. The pain was severe enough that he decided to come in to the hospital. In the emergency department he was given one nitroglycerin with which his pain is down to 0/10. He is no longer short of breath. In the last 10 minutes he has complained of 2/10 pain, but is not in any discomfort or distress.  Home Medications: Prior to Admission medications   Medication Sig Start Date End Date Taking? Authorizing Provider  aspirin EC 81 MG tablet Take 81 mg by mouth daily.   Yes Historical Provider, MD  atenolol (TENORMIN) 25 MG tablet Take 25 mg by mouth daily.   Yes Historical Provider, MD  clopidogrel (PLAVIX) 75 MG tablet Take 75 mg by mouth daily.   Yes Historical Provider, MD  rosuvastatin (CRESTOR) 10 MG tablet Take 10 mg by mouth daily.   Yes Historical Provider, MD    Allergies: No Known Allergies  Past Medical History: Past Medical History  Diagnosis Date  . Coronary artery disease     Past Surgical History  Procedure Date  . Cardiac stents      Social History:  reports that he has been smoking.  He does not have any smokeless tobacco history on file. He reports that he does not drink alcohol or use illicit drugs.  Living Situation: Lives with his wife. Activity Level: Independent with his daily activities   Family History:  there is history of heart disease in the family, but he did not specify any further.  Review of Systems - History obtained from the patient General ROS: negative Psychological ROS: negative Ophthalmic ROS: negative ENT ROS: negative Allergy and Immunology ROS: negative Hematological and Lymphatic ROS: negative Endocrine ROS: negative Respiratory ROS: as in hpi Cardiovascular ROS: as in hpi Gastrointestinal ROS: no abdominal pain, change in bowel habits, or black or bloody stools Genito-Urinary ROS: no dysuria, trouble voiding, or hematuria Musculoskeletal ROS: negative Neurological ROS: no TIA or stroke symptoms Dermatological ROS: negative  Physical Examination  Filed Vitals:   03/14/12 2016 03/14/12 2044 03/14/12 2122 03/14/12 2200  BP: 180/85 180/85 122/69 126/74  Pulse: 65 62 56 54  Temp: 97.5 F (36.4 C)     TempSrc: Oral     Resp: 20 20 18 17   Height: 6' (1.829 m)     Weight: 76.204 kg (168 lb)     SpO2: 100% 100% 100% 100%    General appearance: alert, cooperative, appears stated age and no distress Head: Normocephalic, without obvious abnormality, atraumatic Eyes: conjunctivae/corneas clear. PERRL, EOM's intact. Throat: lips, mucosa, and tongue normal;  teeth and gums normal Neck: no adenopathy, no carotid bruit, no JVD, supple, symmetrical, trachea midline and thyroid not enlarged, symmetric, no tenderness/mass/nodules Back: symmetric, no curvature. ROM normal. No CVA tenderness. Resp: Few crackles at the bases, which diminished with deep breathing. No rhonchi or wheezing. Cardio: regular rate and rhythm, S1, S2 normal, no murmur, click, rub or gallop GI: soft, non-tender;  bowel sounds normal; no masses,  no organomegaly Extremities: extremities normal, atraumatic, no cyanosis or edema Pulses: 2+ and symmetric Skin: Skin color, texture, turgor normal. No rashes or lesions Lymph nodes: Cervical, supraclavicular, and axillary nodes normal. Neurologic: Alert and oriented X 3, normal strength and tone. No focal deficits  Laboratory Data: Results for orders placed during the hospital encounter of 03/14/12 (from the past 48 hour(s))  CBC WITH DIFFERENTIAL     Status: Abnormal   Collection Time   03/14/12  8:00 PM      Component Value Range Comment   WBC 6.6  4.0 - 10.5 K/uL    RBC 4.34  4.22 - 5.81 MIL/uL    Hemoglobin 13.8  13.0 - 17.0 g/dL    HCT 29.5 (*) 62.1 - 52.0 %    MCV 88.9  78.0 - 100.0 fL    MCH 31.8  26.0 - 34.0 pg    MCHC 35.8  30.0 - 36.0 g/dL    RDW 30.8  65.7 - 84.6 %    Platelets 167  150 - 400 K/uL    Neutrophils Relative 52  43 - 77 %    Neutro Abs 3.4  1.7 - 7.7 K/uL    Lymphocytes Relative 41  12 - 46 %    Lymphs Abs 2.7  0.7 - 4.0 K/uL    Monocytes Relative 5  3 - 12 %    Monocytes Absolute 0.4  0.1 - 1.0 K/uL    Eosinophils Relative 1  0 - 5 %    Eosinophils Absolute 0.1  0.0 - 0.7 K/uL    Basophils Relative 0  0 - 1 %    Basophils Absolute 0.0  0.0 - 0.1 K/uL   COMPREHENSIVE METABOLIC PANEL     Status: Abnormal   Collection Time   03/14/12  8:00 PM      Component Value Range Comment   Sodium 140  135 - 145 mEq/L    Potassium 3.5  3.5 - 5.1 mEq/L    Chloride 103  96 - 112 mEq/L    CO2 27  19 - 32 mEq/L    Glucose, Bld 113 (*) 70 - 99 mg/dL    BUN 14  6 - 23 mg/dL    Creatinine, Ser 9.62 (*) 0.50 - 1.35 mg/dL    Calcium 9.8  8.4 - 95.2 mg/dL    Total Protein 7.2  6.0 - 8.3 g/dL    Albumin 4.2  3.5 - 5.2 g/dL    AST 23  0 - 37 U/L    ALT 13  0 - 53 U/L    Alkaline Phosphatase 74  39 - 117 U/L    Total Bilirubin 0.7  0.3 - 1.2 mg/dL    GFR calc non Af Amer 55 (*) >90 mL/min    GFR calc Af Amer 64 (*) >90 mL/min   POCT  I-STAT TROPONIN I     Status: Abnormal   Collection Time   03/14/12  8:26 PM      Component Value Range Comment   Troponin i, poc 0.10 (*) 0.00 -  0.08 ng/mL    Comment 3              Radiology Reports: Dg Chest Portable 1 View  03/14/2012  *RADIOLOGY REPORT*  Clinical Data: Mid to left chest pain.  PORTABLE CHEST - 1 VIEW  Comparison: 09/07/2009  Findings: Normal heart size and pulmonary vascularity.  Slightly shallow inspiration with underlying appearance of hyperinflation, likely due to emphysema. Scattered fibrosis in the perihilar regions.  Linear atelectasis in the left lung base.  No focal consolidation.  No blunting of costophrenic angles.  No pneumothorax.  Mediastinal contours appear intact.  IMPRESSION: Chronic emphysematous changes and scattered fibrosis.  Linear atelectasis in the left base.  No focal consolidation.   Original Report Authenticated By: Burman Nieves, M.D.     Electrocardiogram: EKG shows a sinus rhythm at 64 beats per minute. Normal axis. Intervals appear to be normal. No Q waves. Nonspecific T wave, changes. No concerning ST changes.  Problem List  Principal Problem:  *NSTEMI (non-ST elevated myocardial infarction) Active Problems:  CAD (coronary artery disease)   Assessment: This is a 62 year old, African American male, with a known history of coronary artery disease, who comes in with chest pain. He has a mildly elevated troponin. His pain improved with nitroglycerin. This is NSTEMI or unstable angina.   Plan: #1 non-ST elevation MI: The case has been discussed with the cardiologist, Dr. Royann Shivers. He requested the patient be transferred to [facility] cone. He'll be admitted to step down unit. He'll be maintained on aspirin. Intravenous heparin will be initiated. Nitroglycerin ointment will be utilized for now. If patient continues to have persistent pain, nitroglycerin infusion may have to be initiated. Continue with statin medications. We will hold off on his  Plavix for now in case patient requires surgical intervention per cardiology recommendation. Beta blockers will be continued. He'll be kept n.p.o. past midnight for possible cardiac catheterization in the morning.  #2 mildly elevated creatinine: Give gentle IV hydration and creatinine will be repeated in the morning. Hopefully, with this the creatinine will improve and he will be able to undergo catheterization in the morning.  DVT Prophylaxis: He will be on intravenous heparin Code Status: He is a full code Family Communication: Discussed with the patient and his wife at the, bedside. They are agreeable with the plan  Disposition Plan: He'll be transferred to [facility] at this time.   Further management decisions will depend on results of further testing and patient's response to treatment.  His cardiologist will assume care in the morning.  [facility]  [facility] Pager [phone]  If 7PM-7AM, please contact night-coverage [email or web address] Password [facility]  03/14/2012, 10:39 PM

## 2012-03-14 NOTE — ED Notes (Signed)
Chest pain for 3 0 min,  With radiation down both arms.  And sob

## 2012-03-14 NOTE — ED Notes (Signed)
Pt doesn't want another nitro, states he feels better.

## 2012-03-15 LAB — TROPONIN I
Troponin I: 0.72 ng/mL (ref <0.30)
Troponin I: 0.44 ng/mL (ref <0.30)

## 2012-03-15 LAB — BASIC METABOLIC PANEL
CO2: 23 mEq/L (ref 19–32)
Calcium: 8.7 mg/dL (ref 8.4–10.5)
Creatinine, Ser: 1.37 mg/dL — ABNORMAL HIGH (ref 0.50–1.35)
GFR calc non Af Amer: 55 mL/min — ABNORMAL LOW (ref >90)
Sodium: 140 mEq/L (ref 135–145)

## 2012-03-15 LAB — LIPID PANEL
Cholesterol: 115 mg/dL (ref 0–200)
LDL Cholesterol: 42 mg/dL (ref 0–99)
Triglycerides: 227 mg/dL — ABNORMAL HIGH (ref <150)
VLDL: 45 mg/dL — ABNORMAL HIGH (ref 0–40)

## 2012-03-15 LAB — MRSA PCR SCREENING: MRSA by PCR: NEGATIVE

## 2012-03-15 LAB — CBC
MCH: 31.4 pg (ref 26.0–34.0)
MCHC: 35.4 g/dL (ref 30.0–36.0)
MCV: 88.9 fL (ref 78.0–100.0)
Platelets: 156 10*3/uL (ref 150–400)

## 2012-03-15 LAB — PROTIME-INR
INR: 1.14 (ref 0.00–1.49)
Prothrombin Time: 14.4 seconds (ref 11.6–15.2)

## 2012-03-15 LAB — HEPARIN LEVEL (UNFRACTIONATED): Heparin Unfractionated: 0.65 IU/mL (ref 0.30–0.70)

## 2012-03-15 MED ORDER — TRAMADOL HCL 50 MG PO TABS
50.0000 mg | ORAL_TABLET | Freq: Four times a day (QID) | ORAL | Status: DC | PRN
  Administered 2012-03-17: 50 mg via ORAL
  Filled 2012-03-15 (×2): qty 1

## 2012-03-15 MED ORDER — ACETAMINOPHEN 325 MG PO TABS
650.0000 mg | ORAL_TABLET | ORAL | Status: DC | PRN
  Administered 2012-03-16 – 2012-03-18 (×3): 650 mg via ORAL
  Filled 2012-03-15 (×3): qty 2

## 2012-03-15 MED ORDER — DIAZEPAM 5 MG PO TABS
5.0000 mg | ORAL_TABLET | ORAL | Status: AC
  Administered 2012-03-17: 5 mg via ORAL
  Filled 2012-03-15: qty 1

## 2012-03-15 MED ORDER — HEPARIN BOLUS VIA INFUSION
4000.0000 [IU] | Freq: Once | INTRAVENOUS | Status: AC
  Administered 2012-03-15: 4000 [IU] via INTRAVENOUS
  Filled 2012-03-15: qty 4000

## 2012-03-15 MED ORDER — ZOLPIDEM TARTRATE 5 MG PO TABS: 10.0000 mg | ORAL_TABLET | Freq: Every evening | ORAL | Status: DC | PRN

## 2012-03-15 MED ORDER — ASPIRIN EC 81 MG PO TBEC
81.0000 mg | DELAYED_RELEASE_TABLET | Freq: Every day | ORAL | Status: DC
  Administered 2012-03-15 – 2012-03-18 (×4): 81 mg via ORAL
  Filled 2012-03-15 (×4): qty 1

## 2012-03-15 MED ORDER — SODIUM CHLORIDE 0.9 % IV SOLN
INTRAVENOUS | Status: DC
  Administered 2012-03-15: 02:00:00 via INTRAVENOUS

## 2012-03-15 MED ORDER — SODIUM CHLORIDE 0.9 % IV SOLN
1.0000 mL/kg/h | INTRAVENOUS | Status: DC
  Administered 2012-03-16 – 2012-03-17 (×2): 1 mL/kg/h via INTRAVENOUS

## 2012-03-15 MED ORDER — ONDANSETRON HCL 4 MG/2ML IJ SOLN: 4.0000 mg | Freq: Four times a day (QID) | INTRAMUSCULAR | Status: DC | PRN

## 2012-03-15 MED ORDER — ALPRAZOLAM 0.25 MG PO TABS
0.2500 mg | ORAL_TABLET | Freq: Three times a day (TID) | ORAL | Status: DC | PRN
  Administered 2012-03-17: 16:00:00 0.25 mg via ORAL
  Filled 2012-03-15 (×2): qty 1

## 2012-03-15 MED ORDER — HEPARIN (PORCINE) IN NACL 100-0.45 UNIT/ML-% IJ SOLN
950.0000 [IU]/h | INTRAMUSCULAR | Status: DC
  Administered 2012-03-15 – 2012-03-16 (×3): 900 [IU]/h via INTRAVENOUS
  Filled 2012-03-15 (×5): qty 250

## 2012-03-15 MED ORDER — ATORVASTATIN CALCIUM 20 MG PO TABS
20.0000 mg | ORAL_TABLET | Freq: Every day | ORAL | Status: DC
  Administered 2012-03-15 – 2012-03-17 (×3): 20 mg via ORAL
  Filled 2012-03-15 (×6): qty 1

## 2012-03-15 MED ORDER — METOPROLOL TARTRATE 25 MG PO TABS
25.0000 mg | ORAL_TABLET | Freq: Two times a day (BID) | ORAL | Status: DC
  Administered 2012-03-15 – 2012-03-17 (×5): 25 mg via ORAL
  Filled 2012-03-15 (×10): qty 1

## 2012-03-15 NOTE — Progress Notes (Signed)
Daphane Shepherd PA-C notified of Troponin level 0.72  Pt denies any chest pain, presently resting comfortable in bed. We will continue to monitor. Spouse at bedside.

## 2012-03-15 NOTE — Progress Notes (Signed)
Received pt from [facility] via [facility]. Pt awake, alert and oriented x 4. Pt denies any chest pain,but stated that he feels " a little fullness" in his chest. B/P 125/68 HR 56 RR 17 02 Sat 96 % R/A. CHG bath done, MRSA collected, pt oriented to room and settled comfortable in bed. Lt PIV infusing NS @kvo . We will continue to monitor.

## 2012-03-15 NOTE — Consult Note (Signed)
[facility]       CONSULTATION NOTE   Reason for Consult: NSTEMI  Requesting Physician: Dr. Rito Ehrlich  Cardiologist: Dr. Rennis Golden  HPI: This is a 62 y.o. male with a past medical history significant for coronary artery disease, who has had stents placed to the circumflex and diagonal in 2003. He had a cardiac catheterization in 2011, which showed patent stents with other CAD. He was in his usual state of health till 8 PM tonight when he was in his house doing nonexertional activity. He started developing pressure-like sensation in the retrosternal area. The achiness radiated to both the arms. He had shortness of breath but no dizziness. The pain was 8/10 in intensity. Denies any cough. And denies any palpitations, or diaphoresis. Denies any leg swelling. Denies any acid reflux. He took aspirin and cholesterol medications at home, but did not take any nitroglycerin. The pain was severe enough that he decided to come in to the hospital. In the emergency department he was given one nitroglycerin with which his pain is down to 0/10. He was transferred to [facility]. Troponin is elevated consistent with NSTEMI.  He is on medical therapy and will need cardiac cathterization on Monday.  PMHx:  Past Medical History  Diagnosis Date  . Coronary artery disease    Past Surgical History  Procedure Date  . Cardiac stents     FAMHx: History reviewed. No pertinent family history.  SOCHx:  reports that he has been smoking.  He does not have any smokeless tobacco history on file. He reports that he does not drink alcohol or use illicit drugs.  ALLERGIES: No Known Allergies  ROS: A comprehensive review of systems was negative except for: Cardiovascular: positive for chest pain  HOME MEDICATIONS: Prescriptions prior to admission  Medication Sig Dispense Refill  . aspirin EC 81 MG tablet Take 81 mg by mouth daily.      Marland Kitchen atenolol (TENORMIN) 25 MG tablet Take 25 mg by  mouth daily.      . clopidogrel (PLAVIX) 75 MG tablet Take 75 mg by mouth daily.      . rosuvastatin (CRESTOR) 10 MG tablet Take 10 mg by mouth daily.        HOSPITAL MEDICATIONS: Prior to Admission:  Prescriptions prior to admission  Medication Sig Dispense Refill  . aspirin EC 81 MG tablet Take 81 mg by mouth daily.      Marland Kitchen atenolol (TENORMIN) 25 MG tablet Take 25 mg by mouth daily.      . clopidogrel (PLAVIX) 75 MG tablet Take 75 mg by mouth daily.      . rosuvastatin (CRESTOR) 10 MG tablet Take 10 mg by mouth daily.        VITALS: Blood pressure 107/65, pulse 51, temperature 98.1 F (36.7 C), temperature source Oral, resp. rate 16, height 6' (1.829 m), weight 75.7 kg (166 lb 14.2 oz), SpO2 97.00%.  PHYSICAL EXAM: General appearance: alert and no distress Neck: no adenopathy, no carotid bruit, no JVD, supple, symmetrical, trachea midline and thyroid not enlarged, symmetric, no tenderness/mass/nodules Lungs: clear to auscultation bilaterally Heart: regular rate and rhythm, S1, S2 normal, no murmur, click, rub or gallop Abdomen: soft, non-tender; bowel sounds normal; no masses,  no organomegaly Extremities: extremities normal, atraumatic, no cyanosis or edema Pulses: 2+ and symmetric Skin: Skin color, texture, turgor normal. No rashes or lesions Neurologic: Grossly normal  LABS: Results for orders placed during the hospital encounter of 03/14/12 (from  the past 48 hour(s))  CBC WITH DIFFERENTIAL     Status: Abnormal   Collection Time   03/14/12  8:00 PM      Component Value Range Comment   WBC 6.6  4.0 - 10.5 K/uL    RBC 4.34  4.22 - 5.81 MIL/uL    Hemoglobin 13.8  13.0 - 17.0 g/dL    HCT 16.1 (*) 09.6 - 52.0 %    MCV 88.9  78.0 - 100.0 fL    MCH 31.8  26.0 - 34.0 pg    MCHC 35.8  30.0 - 36.0 g/dL    RDW 04.5  40.9 - 81.1 %    Platelets 167  150 - 400 K/uL    Neutrophils Relative 52  43 - 77 %    Neutro Abs 3.4  1.7 - 7.7 K/uL    Lymphocytes Relative 41  12 - 46 %     Lymphs Abs 2.7  0.7 - 4.0 K/uL    Monocytes Relative 5  3 - 12 %    Monocytes Absolute 0.4  0.1 - 1.0 K/uL    Eosinophils Relative 1  0 - 5 %    Eosinophils Absolute 0.1  0.0 - 0.7 K/uL    Basophils Relative 0  0 - 1 %    Basophils Absolute 0.0  0.0 - 0.1 K/uL   COMPREHENSIVE METABOLIC PANEL     Status: Abnormal   Collection Time   03/14/12  8:00 PM      Component Value Range Comment   Sodium 140  135 - 145 mEq/L    Potassium 3.5  3.5 - 5.1 mEq/L    Chloride 103  96 - 112 mEq/L    CO2 27  19 - 32 mEq/L    Glucose, Bld 113 (*) 70 - 99 mg/dL    BUN 14  6 - 23 mg/dL    Creatinine, Ser 9.14 (*) 0.50 - 1.35 mg/dL    Calcium 9.8  8.4 - 78.2 mg/dL    Total Protein 7.2  6.0 - 8.3 g/dL    Albumin 4.2  3.5 - 5.2 g/dL    AST 23  0 - 37 U/L    ALT 13  0 - 53 U/L    Alkaline Phosphatase 74  39 - 117 U/L    Total Bilirubin 0.7  0.3 - 1.2 mg/dL    GFR calc non Af Amer 55 (*) >90 mL/min    GFR calc Af Amer 64 (*) >90 mL/min   POCT I-STAT TROPONIN I     Status: Abnormal   Collection Time   03/14/12  8:26 PM      Component Value Range Comment   Troponin i, poc 0.10 (*) 0.00 - 0.08 ng/mL    Comment 3            MRSA PCR SCREENING     Status: Normal   Collection Time   03/15/12  1:03 AM      Component Value Range Comment   MRSA by PCR NEGATIVE  NEGATIVE   TROPONIN I     Status: Abnormal   Collection Time   03/15/12  1:50 AM      Component Value Range Comment   Troponin I 0.72 (*) <0.30 ng/mL   CBC     Status: Abnormal   Collection Time   03/15/12  2:10 AM      Component Value Range Comment   WBC 5.6  4.0 - 10.5 K/uL  RBC 3.88 (*) 4.22 - 5.81 MIL/uL    Hemoglobin 12.2 (*) 13.0 - 17.0 g/dL    HCT 16.1 (*) 09.6 - 52.0 %    MCV 88.9  78.0 - 100.0 fL    MCH 31.4  26.0 - 34.0 pg    MCHC 35.4  30.0 - 36.0 g/dL    RDW 04.5  40.9 - 81.1 %    Platelets 156  150 - 400 K/uL   BASIC METABOLIC PANEL     Status: Abnormal   Collection Time   03/15/12  2:10 AM      Component Value Range Comment    Sodium 140  135 - 145 mEq/L    Potassium 3.8  3.5 - 5.1 mEq/L    Chloride 109  96 - 112 mEq/L    CO2 23  19 - 32 mEq/L    Glucose, Bld 104 (*) 70 - 99 mg/dL    BUN 17  6 - 23 mg/dL    Creatinine, Ser 9.14 (*) 0.50 - 1.35 mg/dL    Calcium 8.7  8.4 - 78.2 mg/dL    GFR calc non Af Amer 55 (*) >90 mL/min    GFR calc Af Amer 63 (*) >90 mL/min   LIPID PANEL     Status: Abnormal   Collection Time   03/15/12  2:10 AM      Component Value Range Comment   Cholesterol 115  0 - 200 mg/dL    Triglycerides 956 (*) <150 mg/dL    HDL 28 (*) >21 mg/dL    Total CHOL/HDL Ratio 4.1      VLDL 45 (*) 0 - 40 mg/dL    LDL Cholesterol 42  0 - 99 mg/dL   TROPONIN I     Status: Abnormal   Collection Time   03/15/12  6:50 AM      Component Value Range Comment   Troponin I 0.49 (*) <0.30 ng/mL     IMAGING: Dg Chest Portable 1 View  03/14/2012  *RADIOLOGY REPORT*  Clinical Data: Mid to left chest pain.  PORTABLE CHEST - 1 VIEW  Comparison: 09/07/2009  Findings: Normal heart size and pulmonary vascularity.  Slightly shallow inspiration with underlying appearance of hyperinflation, likely due to emphysema. Scattered fibrosis in the perihilar regions.  Linear atelectasis in the left lung base.  No focal consolidation.  No blunting of costophrenic angles.  No pneumothorax.  Mediastinal contours appear intact.  IMPRESSION: Chronic emphysematous changes and scattered fibrosis.  Linear atelectasis in the left base.  No focal consolidation.   Original Report Authenticated By: Burman Nieves, M.D.     IMPRESSION: 1. NSTEMI  RECOMMENDATION: 1. We will assume care of the patient on our service. He is on heparin and topical nitropaste. Chest pain free currently. I will plan cardiac catheterization, probable radial approach, on Monday.  Ok to eat today, keep NPO p MN on Sunday.  Time Spent Directly with Patient: 15 minutes  Chrystie Nose, MD, [facility] Attending Cardiologist [facility]  Briget Shaheed C 03/15/2012, 8:42 AM

## 2012-03-15 NOTE — Progress Notes (Signed)
ANTICOAGULATION CONSULT NOTE - Follow Up Consult  Pharmacy Consult for Heparin Indication: NSTEMI  No Known Allergies  Patient Measurements: Height: 6' (182.9 cm) Weight: 166 lb 14.2 oz (75.7 kg) IBW/kg (Calculated) : 77.6   Vital Signs: Temp: 97.6 F (36.4 C) (02/01 1140) Temp src: Oral (02/01 1140) BP: 112/65 mmHg (02/01 1400) Pulse Rate: 54  (02/01 1400)  Labs:  Basename 03/15/12 1336 03/15/12 0650 03/15/12 0210 03/15/12 0150 03/14/12 2000  HGB -- -- 12.2* -- 13.8  HCT -- -- 34.5* -- 38.6*  PLT -- -- 156 -- 167  APTT -- -- -- -- --  LABPROT -- -- -- -- --  INR -- -- -- -- --  HEPARINUNFRC 0.65 -- -- -- --  CREATININE -- -- 1.37* -- 1.36*  CKTOTAL -- -- -- -- --  CKMB -- -- -- -- --  TROPONINI 0.44* 0.49* -- 0.72* --    Estimated Creatinine Clearance: 61.4 ml/min (by C-G formula based on Cr of 1.37).   Medications:  Scheduled:    . [COMPLETED] aspirin  243 mg Oral Once  . aspirin EC  81 mg Oral Daily  . atorvastatin  20 mg Oral q1800  . diazepam  5 mg Oral On Call  . [COMPLETED] heparin  4,000 Units Intravenous Once  . metoprolol tartrate  25 mg Oral BID  . nitroGLYCERIN  0.5 inch Topical Q6H  . [COMPLETED] nitroGLYCERIN       Infusions:    . sodium chloride    . heparin 900 Units/hr (03/15/12 1610)  . [DISCONTINUED] sodium chloride Stopped (03/15/12 0931)    Assessment: 62yo male w/ h/o CAD c/o chest pressure radiating to both arms and associated with SOB, to continue heparin for NSTEMI. Heparin level is therapeutic (0.65) on 900 units/hr. No bleeding noted. Will check confirmatory HL in 6 hours and obtain baseline PT/INR. H/H slightly low, platelets are wnl.  Goal of Therapy:  Heparin level 0.3-0.7 units/ml Monitor platelets by anticoagulation protocol: Yes   Plan:  Continue heparin infusion at 900 units/hr Check heparin level in 6 hours and daily while on heparin Continue to monitor H&H and platelets  Lillia Pauls, PharmD Clinical  Pharmacist Pager: [phone] Phone: [phone] 03/15/2012 2:42 PM

## 2012-03-15 NOTE — Progress Notes (Signed)
ANTICOAGULATION CONSULT NOTE - Initial Consult  Pharmacy Consult for heparin Indication: NSTEMI  No Known Allergies  Patient Measurements: Height: 6' (182.9 cm) Weight: 166 lb 14.2 oz (75.7 kg) IBW/kg (Calculated) : 77.6   Vital Signs: Temp: 98.1 F (36.7 C) (02/01 0353) Temp src: Oral (02/01 0353) BP: 110/68 mmHg (02/01 0600) Pulse Rate: 51  (02/01 0600)  Labs:  Basename 03/15/12 0210 03/15/12 0150 03/14/12 2000  HGB 12.2* -- 13.8  HCT 34.5* -- 38.6*  PLT 156 -- 167  APTT -- -- --  LABPROT -- -- --  INR -- -- --  HEPARINUNFRC -- -- --  CREATININE 1.37* -- 1.36*  CKTOTAL -- -- --  CKMB -- -- --  TROPONINI -- 0.72* --    Estimated Creatinine Clearance: 61.4 ml/min (by C-G formula based on Cr of 1.37).   Medical History: Past Medical History  Diagnosis Date  . Coronary artery disease     Medications:  Prescriptions prior to admission  Medication Sig Dispense Refill  . aspirin EC 81 MG tablet Take 81 mg by mouth daily.      Marland Kitchen atenolol (TENORMIN) 25 MG tablet Take 25 mg by mouth daily.      . clopidogrel (PLAVIX) 75 MG tablet Take 75 mg by mouth daily.      . rosuvastatin (CRESTOR) 10 MG tablet Take 10 mg by mouth daily.       Scheduled:    . [COMPLETED] aspirin  243 mg Oral Once  . aspirin EC  81 mg Oral Daily  . atorvastatin  20 mg Oral q1800  . heparin  4,000 Units Intravenous Once  . metoprolol tartrate  25 mg Oral BID  . nitroGLYCERIN  0.5 inch Topical Q6H  . [COMPLETED] nitroGLYCERIN        Assessment: 62yo male w/ h/o CAD c/o chest pressure radiating to both arms and associated with SOB, to begin heparin for NSTEMI.  Goal of Therapy:  Heparin level 0.3-0.7 units/ml Monitor platelets by anticoagulation protocol: Yes   Plan:  Will give heparin 4000 units IV bolus x1 followed by gtt at 900 units/hr and monitor heparin levels and CBC.  Colleen Can PharmD BCPS 03/15/2012,7:14 AM

## 2012-03-15 NOTE — Progress Notes (Signed)
ANTICOAGULATION CONSULT NOTE - Follow Up Consult  Pharmacy Consult for Heparin Indication: NSTEMI  No Known Allergies  Patient Measurements: Height: 6' (182.9 cm) Weight: 166 lb 14.2 oz (75.7 kg) IBW/kg (Calculated) : 77.6   Vital Signs: Temp: 98.1 F (36.7 C) (02/01 2006) Temp src: Oral (02/01 2006) BP: 107/63 mmHg (02/01 2006) Pulse Rate: 55  (02/01 2006)  Labs:  Basename 03/15/12 1953 03/15/12 1445 03/15/12 1336 03/15/12 0650 03/15/12 0210 03/15/12 0150 03/14/12 2000  HGB -- -- -- -- 12.2* -- 13.8  HCT -- -- -- -- 34.5* -- 38.6*  PLT -- -- -- -- 156 -- 167  APTT -- -- -- -- -- -- --  LABPROT -- 14.4 -- -- -- -- --  INR -- 1.14 -- -- -- -- --  HEPARINUNFRC 0.65 -- 0.65 -- -- -- --  CREATININE -- -- -- -- 1.37* -- 1.36*  CKTOTAL -- -- -- -- -- -- --  CKMB -- -- -- -- -- -- --  TROPONINI -- -- 0.44* 0.49* -- 0.72* --    Estimated Creatinine Clearance: 61.4 ml/min (by C-G formula based on Cr of 1.37).   Medications:  Scheduled:     . aspirin EC  81 mg Oral Daily  . atorvastatin  20 mg Oral q1800  . diazepam  5 mg Oral On Call  . [COMPLETED] heparin  4,000 Units Intravenous Once  . metoprolol tartrate  25 mg Oral BID  . nitroGLYCERIN  0.5 inch Topical Q6H   Infusions:     . sodium chloride    . heparin 900 Units/hr (03/15/12 7829)  . [DISCONTINUED] sodium chloride Stopped (03/15/12 0931)    Assessment: 61 YOM on heparin for ACS, possible for cath on Monday. Confirmatory heparin level therapeutic. No bleeding noted per chart.  Goal of Therapy:  Heparin level 0.3-0.7 units/ml Monitor platelets by anticoagulation protocol: Yes   Plan:  Continue heparin infusion at 900 units/hr F/u AM labs.  Bayard Hugger, PharmD, BCPS  Clinical Pharmacist  Pager: [phone]   03/15/2012 8:51 PM

## 2012-03-16 DIAGNOSIS — N182 Chronic kidney disease, stage 2 (mild): Secondary | ICD-10-CM | POA: Diagnosis present

## 2012-03-16 LAB — CBC
MCH: 30.7 pg (ref 26.0–34.0)
MCV: 89.2 fL (ref 78.0–100.0)
Platelets: 144 10*3/uL — ABNORMAL LOW (ref 150–400)
RBC: 3.98 MIL/uL — ABNORMAL LOW (ref 4.22–5.81)
RDW: 13 % (ref 11.5–15.5)
WBC: 5.6 10*3/uL (ref 4.0–10.5)

## 2012-03-16 NOTE — Progress Notes (Signed)
[facility]  DAILY PROGRESS NOTE   Subjective:  No events overnight. No further chest pain.  Objective:  Temp:  [97.6 F (36.4 C)-98.1 F (36.7 C)] 97.9 F (36.6 C) (02/02 0452) Pulse Rate:  [46-57] 50  (02/02 0452) Resp:  [14-19] 16  (02/02 0452) BP: (100-127)/(60-69) 116/60 mmHg (02/02 0452) SpO2:  [96 %-100 %] 97 % (02/01 2006) Weight:  [77 kg (169 lb 12.1 oz)] 77 kg (169 lb 12.1 oz) (02/02 0452) Weight change: 0.796 kg (1 lb 12.1 oz)  Intake/Output from previous day: 02/01 0701 - 02/02 0700 In: 480 [I.V.:480] Out: 425 [Urine:425]  Intake/Output from this shift:    Medications: Current Facility-Administered Medications  Medication Dose Route Frequency Provider Last Rate Last Dose  . 0.9 %  sodium chloride infusion  1 mL/kg/hr Intravenous Continuous Abelino Derrick, [place]      . acetaminophen (TYLENOL) tablet 650 mg  650 mg Oral Q4H PRN Osvaldo Shipper, MD   650 mg at 03/16/12 0001  . ALPRAZolam Prudy Feeler) tablet 0.25 mg  0.25 mg Oral TID PRN Abelino Derrick, PA      . aspirin EC tablet 81 mg  81 mg Oral Daily Osvaldo Shipper, MD   81 mg at 03/15/12 0929  . atorvastatin (LIPITOR) tablet 20 mg  20 mg Oral q1800 Osvaldo Shipper, MD   20 mg at 03/15/12 1758  . diazepam (VALIUM) tablet 5 mg  5 mg Oral On Call Abelino Derrick, [place]      . heparin ADULT infusion 100 units/mL (25000 units/250 mL)  900 Units/hr Intravenous Continuous Colleen Can, [place] 9 mL/hr at 03/15/12 7829 900 Units/hr at 03/15/12 5621  . metoprolol tartrate (LOPRESSOR) tablet 25 mg  25 mg Oral BID Osvaldo Shipper, MD   25 mg at 03/15/12 2127  . nitroGLYCERIN (NITROGLYN) 2 % ointment 0.5 inch  0.5 inch Topical Q6H Osvaldo Shipper, MD   0.5 inch at 03/16/12 0559  . ondansetron (ZOFRAN) injection 4 mg  4 mg Intravenous Q6H PRN Osvaldo Shipper, MD      . traMADol Janean Sark) tablet 50 mg  50 mg Oral Q6H PRN Abelino Derrick, PA      . zolpidem (AMBIEN) tablet 10 mg  10 mg Oral QHS PRN Abelino Derrick,  PA        Physical Exam: General appearance: alert and no distress Neck: no adenopathy, no carotid bruit, no JVD, supple, symmetrical, trachea midline and thyroid not enlarged, symmetric, no tenderness/mass/nodules Lungs: clear to auscultation bilaterally Heart: regular rate and rhythm, S1, S2 normal, no murmur, click, rub or gallop Abdomen: soft, non-tender; bowel sounds normal; no masses,  no organomegaly Extremities: extremities normal, atraumatic, no cyanosis or edema Pulses: 2+ and symmetric Skin: Skin color, texture, turgor normal. No rashes or lesions Neurologic: Grossly normal  Lab Results: Results for orders placed during the hospital encounter of 03/14/12 (from the past 48 hour(s))  CBC WITH DIFFERENTIAL     Status: Abnormal   Collection Time   03/14/12  8:00 PM      Component Value Range Comment   WBC 6.6  4.0 - 10.5 K/uL    RBC 4.34  4.22 - 5.81 MIL/uL    Hemoglobin 13.8  13.0 - 17.0 g/dL    HCT 30.8 (*) 65.7 - 52.0 %    MCV 88.9  78.0 - 100.0 fL    MCH 31.8  26.0 - 34.0 pg    MCHC 35.8  30.0 - 36.0  g/dL    RDW 16.1  09.6 - 04.5 %    Platelets 167  150 - 400 K/uL    Neutrophils Relative 52  43 - 77 %    Neutro Abs 3.4  1.7 - 7.7 K/uL    Lymphocytes Relative 41  12 - 46 %    Lymphs Abs 2.7  0.7 - 4.0 K/uL    Monocytes Relative 5  3 - 12 %    Monocytes Absolute 0.4  0.1 - 1.0 K/uL    Eosinophils Relative 1  0 - 5 %    Eosinophils Absolute 0.1  0.0 - 0.7 K/uL    Basophils Relative 0  0 - 1 %    Basophils Absolute 0.0  0.0 - 0.1 K/uL   COMPREHENSIVE METABOLIC PANEL     Status: Abnormal   Collection Time   03/14/12  8:00 PM      Component Value Range Comment   Sodium 140  135 - 145 mEq/L    Potassium 3.5  3.5 - 5.1 mEq/L    Chloride 103  96 - 112 mEq/L    CO2 27  19 - 32 mEq/L    Glucose, Bld 113 (*) 70 - 99 mg/dL    BUN 14  6 - 23 mg/dL    Creatinine, Ser 4.09 (*) 0.50 - 1.35 mg/dL    Calcium 9.8  8.4 - 81.1 mg/dL    Total Protein 7.2  6.0 - 8.3 g/dL     Albumin 4.2  3.5 - 5.2 g/dL    AST 23  0 - 37 U/L    ALT 13  0 - 53 U/L    Alkaline Phosphatase 74  39 - 117 U/L    Total Bilirubin 0.7  0.3 - 1.2 mg/dL    GFR calc non Af Amer 55 (*) >90 mL/min    GFR calc Af Amer 64 (*) >90 mL/min   POCT I-STAT TROPONIN I     Status: Abnormal   Collection Time   03/14/12  8:26 PM      Component Value Range Comment   Troponin i, poc 0.10 (*) 0.00 - 0.08 ng/mL    Comment 3            MRSA PCR SCREENING     Status: Normal   Collection Time   03/15/12  1:03 AM      Component Value Range Comment   MRSA by PCR NEGATIVE  NEGATIVE   TROPONIN I     Status: Abnormal   Collection Time   03/15/12  1:50 AM      Component Value Range Comment   Troponin I 0.72 (*) <0.30 ng/mL   CBC     Status: Abnormal   Collection Time   03/15/12  2:10 AM      Component Value Range Comment   WBC 5.6  4.0 - 10.5 K/uL    RBC 3.88 (*) 4.22 - 5.81 MIL/uL    Hemoglobin 12.2 (*) 13.0 - 17.0 g/dL    HCT 91.4 (*) 78.2 - 52.0 %    MCV 88.9  78.0 - 100.0 fL    MCH 31.4  26.0 - 34.0 pg    MCHC 35.4  30.0 - 36.0 g/dL    RDW 95.6  21.3 - 08.6 %    Platelets 156  150 - 400 K/uL   BASIC METABOLIC PANEL     Status: Abnormal   Collection Time   03/15/12  2:10 AM  Component Value Range Comment   Sodium 140  135 - 145 mEq/L    Potassium 3.8  3.5 - 5.1 mEq/L    Chloride 109  96 - 112 mEq/L    CO2 23  19 - 32 mEq/L    Glucose, Bld 104 (*) 70 - 99 mg/dL    BUN 17  6 - 23 mg/dL    Creatinine, Ser 0.10 (*) 0.50 - 1.35 mg/dL    Calcium 8.7  8.4 - 27.2 mg/dL    GFR calc non Af Amer 55 (*) >90 mL/min    GFR calc Af Amer 63 (*) >90 mL/min   LIPID PANEL     Status: Abnormal   Collection Time   03/15/12  2:10 AM      Component Value Range Comment   Cholesterol 115  0 - 200 mg/dL    Triglycerides 536 (*) <150 mg/dL    HDL 28 (*) >64 mg/dL    Total CHOL/HDL Ratio 4.1      VLDL 45 (*) 0 - 40 mg/dL    LDL Cholesterol 42  0 - 99 mg/dL   TROPONIN I     Status: Abnormal   Collection Time    03/15/12  6:50 AM      Component Value Range Comment   Troponin I 0.49 (*) <0.30 ng/mL   TROPONIN I     Status: Abnormal   Collection Time   03/15/12  1:36 PM      Component Value Range Comment   Troponin I 0.44 (*) <0.30 ng/mL   HEPARIN LEVEL (UNFRACTIONATED)     Status: Normal   Collection Time   03/15/12  1:36 PM      Component Value Range Comment   Heparin Unfractionated 0.65  0.30 - 0.70 IU/mL   PROTIME-INR     Status: Normal   Collection Time   03/15/12  2:45 PM      Component Value Range Comment   Prothrombin Time 14.4  11.6 - 15.2 seconds    INR 1.14  0.00 - 1.49   HEPARIN LEVEL (UNFRACTIONATED)     Status: Normal   Collection Time   03/15/12  7:53 PM      Component Value Range Comment   Heparin Unfractionated 0.65  0.30 - 0.70 IU/mL   HEPARIN LEVEL (UNFRACTIONATED)     Status: Normal   Collection Time   03/16/12  4:33 AM      Component Value Range Comment   Heparin Unfractionated 0.62  0.30 - 0.70 IU/mL   CBC     Status: Abnormal   Collection Time   03/16/12  4:33 AM      Component Value Range Comment   WBC 5.6  4.0 - 10.5 K/uL    RBC 3.98 (*) 4.22 - 5.81 MIL/uL    Hemoglobin 12.2 (*) 13.0 - 17.0 g/dL    HCT 40.3 (*) 47.4 - 52.0 %    MCV 89.2  78.0 - 100.0 fL    MCH 30.7  26.0 - 34.0 pg    MCHC 34.4  30.0 - 36.0 g/dL    RDW 25.9  56.3 - 87.5 %    Platelets 144 (*) 150 - 400 K/uL     Imaging: Dg Chest Portable 1 View  03/14/2012  *RADIOLOGY REPORT*  Clinical Data: Mid to left chest pain.  PORTABLE CHEST - 1 VIEW  Comparison: 09/07/2009  Findings: Normal heart size and pulmonary vascularity.  Slightly shallow inspiration with underlying appearance of hyperinflation,  likely due to emphysema. Scattered fibrosis in the perihilar regions.  Linear atelectasis in the left lung base.  No focal consolidation.  No blunting of costophrenic angles.  No pneumothorax.  Mediastinal contours appear intact.  IMPRESSION: Chronic emphysematous changes and scattered fibrosis.  Linear atelectasis  in the left base.  No focal consolidation.   Original Report Authenticated By: Burman Nieves, M.D.     Assessment:  Principal Problem:  *NSTEMI (non-ST elevated myocardial infarction) Active Problems:  CAD (coronary artery disease)  CKD (chronic kidney disease) stage 2, GFR 60-89 ml/min   Plan:  1. For left heart catheterization tomorrow. Hydrate overnight and in the morning due to CKD II. Discussed risks and benefits of cardiac catheterization and he understands risks and wishes to proceed.  Time Spent Directly with Patient:  15 minutes  Length of Stay:  LOS: 2 days   Chrystie Nose, MD, [facility] Attending Cardiologist [facility]  HILTY,Kenneth C 03/16/2012, 7:50 AM

## 2012-03-16 NOTE — Progress Notes (Signed)
ANTICOAGULATION CONSULT NOTE - Follow Up Consult  Pharmacy Consult for heparin Indication: NSTEMI  No Known Allergies  Patient Measurements: Height: 6' (182.9 cm) Weight: 169 lb 12.1 oz (77 kg) IBW/kg (Calculated) : 77.6   Vital Signs: Temp: 96.7 F (35.9 C) (02/02 1100) Temp src: Oral (02/02 1100) BP: 119/64 mmHg (02/02 1149) Pulse Rate: 51  (02/02 1149)  Labs:  Basename 03/16/12 0433 03/15/12 1953 03/15/12 1445 03/15/12 1336 03/15/12 0650 03/15/12 0210 03/15/12 0150 03/14/12 2000  HGB 12.2* -- -- -- -- 12.2* -- --  HCT 35.5* -- -- -- -- 34.5* -- 38.6*  PLT 144* -- -- -- -- 156 -- 167  APTT -- -- -- -- -- -- -- --  LABPROT -- -- 14.4 -- -- -- -- --  INR -- -- 1.14 -- -- -- -- --  HEPARINUNFRC 0.62 0.65 -- 0.65 -- -- -- --  CREATININE -- -- -- -- -- 1.37* -- 1.36*  CKTOTAL -- -- -- -- -- -- -- --  CKMB -- -- -- -- -- -- -- --  TROPONINI -- -- -- 0.44* 0.49* -- 0.72* --    Estimated Creatinine Clearance: 62.4 ml/min (by C-G formula based on Cr of 1.37).   Medications:  Scheduled:    . aspirin EC  81 mg Oral Daily  . atorvastatin  20 mg Oral q1800  . diazepam  5 mg Oral On Call  . metoprolol tartrate  25 mg Oral BID  . nitroGLYCERIN  0.5 inch Topical Q6H   Infusions:    . sodium chloride    . heparin 900 Units/hr (03/16/12 0945)    Assessment: 62yo male w/ h/o CAD c/o chest pressure radiating to both arms and associated with SOB, to continue heparin for NSTEMI. Heparin level is therapeutic (0.62) on 900 units/hr. No bleeding noted. H/H slightly low, platelets 156>>144 today.  Goal of Therapy:  Heparin level 0.3-0.7 units/ml  Monitor platelets by anticoagulation protocol: Yes   Plan:  Continue heparin infusion at 900 units/hr  Check heparin levels daily while on heparin  Continue to monitor H&H and platelets  Lillia Pauls, PharmD Clinical Pharmacist 03/16/2012 1:32 PM

## 2012-03-17 ENCOUNTER — Encounter (HOSPITAL_COMMUNITY)
Admission: EM | Disposition: A | Discharge status: Home / Self Care | Payer: Self-pay | Source: Home / Self Care | Attending: Internal Medicine

## 2012-03-17 HISTORY — PX: CORONARY ANGIOPLASTY WITH STENT PLACEMENT: SHX49

## 2012-03-17 HISTORY — PX: LEFT HEART CATHETERIZATION WITH CORONARY ANGIOGRAM: SHX5451

## 2012-03-17 HISTORY — PX: PERCUTANEOUS CORONARY STENT INTERVENTION (PCI-S): SHX5485

## 2012-03-17 LAB — CBC
MCH: 31.2 pg (ref 26.0–34.0)
MCHC: 35.1 g/dL (ref 30.0–36.0)
Platelets: 133 10*3/uL — ABNORMAL LOW (ref 150–400)
RBC: 3.88 MIL/uL — ABNORMAL LOW (ref 4.22–5.81)
RDW: 13 % (ref 11.5–15.5)

## 2012-03-17 LAB — BASIC METABOLIC PANEL
BUN: 11 mg/dL (ref 6–23)
CO2: 22 mEq/L (ref 19–32)
Calcium: 8.3 mg/dL — ABNORMAL LOW (ref 8.4–10.5)
Chloride: 109 mEq/L (ref 96–112)
Creatinine, Ser: 1.19 mg/dL (ref 0.50–1.35)
GFR calc Af Amer: 75 mL/min — ABNORMAL LOW (ref >90)
GFR calc non Af Amer: 65 mL/min — ABNORMAL LOW (ref >90)
Glucose, Bld: 102 mg/dL — ABNORMAL HIGH (ref 70–99)
Potassium: 3.8 mEq/L (ref 3.5–5.1)
Sodium: 138 mEq/L (ref 135–145)

## 2012-03-17 LAB — PROTIME-INR
INR: 1.01 (ref 0.00–1.49)
Prothrombin Time: 13.2 seconds (ref 11.6–15.2)

## 2012-03-17 LAB — POCT ACTIVATED CLOTTING TIME
Activated Clotting Time: 143 seconds
Activated Clotting Time: 513 seconds

## 2012-03-17 LAB — HEPARIN LEVEL (UNFRACTIONATED): Heparin Unfractionated: 0.29 IU/mL — ABNORMAL LOW (ref 0.30–0.70)

## 2012-03-17 SURGERY — LEFT HEART CATHETERIZATION WITH CORONARY ANGIOGRAM
Anesthesia: LOCAL

## 2012-03-17 MED ORDER — MIDAZOLAM HCL 2 MG/2ML IJ SOLN
INTRAMUSCULAR | Status: AC
  Filled 2012-03-17: qty 2

## 2012-03-17 MED ORDER — ACETAMINOPHEN 325 MG PO TABS: 650.0000 mg | ORAL_TABLET | ORAL | Status: DC | PRN

## 2012-03-17 MED ORDER — LIDOCAINE HCL (PF) 1 % IJ SOLN
INTRAMUSCULAR | Status: AC
  Filled 2012-03-17: qty 30

## 2012-03-17 MED ORDER — TICAGRELOR 90 MG PO TABS
ORAL_TABLET | ORAL | Status: AC
  Filled 2012-03-17: qty 2

## 2012-03-17 MED ORDER — ASPIRIN EC 81 MG PO TBEC: 81.0000 mg | DELAYED_RELEASE_TABLET | Freq: Every day | ORAL | Status: DC

## 2012-03-17 MED ORDER — SODIUM CHLORIDE 0.9 % IV SOLN: Freq: Once | INTRAVENOUS | Status: DC

## 2012-03-17 MED ORDER — HEPARIN (PORCINE) IN NACL 2-0.9 UNIT/ML-% IJ SOLN
INTRAMUSCULAR | Status: AC
  Filled 2012-03-17: qty 1000

## 2012-03-17 MED ORDER — FENTANYL CITRATE 0.05 MG/ML IJ SOLN
INTRAMUSCULAR | Status: AC
  Filled 2012-03-17: qty 2

## 2012-03-17 MED ORDER — ISOSORBIDE MONONITRATE ER 30 MG PO TB24
30.0000 mg | ORAL_TABLET | Freq: Every day | ORAL | Status: DC
  Administered 2012-03-17 – 2012-03-18 (×2): 30 mg via ORAL
  Filled 2012-03-17 (×3): qty 1

## 2012-03-17 MED ORDER — ASPIRIN 81 MG PO CHEW
CHEWABLE_TABLET | ORAL | Status: AC
  Filled 2012-03-17: qty 3

## 2012-03-17 MED ORDER — BIVALIRUDIN 250 MG IV SOLR
INTRAVENOUS | Status: AC
  Filled 2012-03-17: qty 250

## 2012-03-17 MED ORDER — NITROGLYCERIN 1 MG/10 ML FOR IR/CATH LAB
INTRA_ARTERIAL | Status: AC
  Filled 2012-03-17: qty 10

## 2012-03-17 MED ORDER — TICAGRELOR 90 MG PO TABS
90.0000 mg | ORAL_TABLET | Freq: Two times a day (BID) | ORAL | Status: DC
  Administered 2012-03-17 – 2012-03-18 (×2): 90 mg via ORAL
  Filled 2012-03-17 (×3): qty 1

## 2012-03-17 MED ORDER — SODIUM CHLORIDE 0.9 % IV SOLN
INTRAVENOUS | Status: DC
  Administered 2012-03-17: 1000 mL via INTRAVENOUS

## 2012-03-17 MED ORDER — ONDANSETRON HCL 4 MG/2ML IJ SOLN: 4.0000 mg | Freq: Four times a day (QID) | INTRAMUSCULAR | Status: DC | PRN

## 2012-03-17 NOTE — CV Procedure (Signed)
PCI Note: Stent to LAD (2.25 x 15 mm DES Xience Xpedition)  Robert Shaw, 62 y.o., male   See Dr. Blanchie Dessert diagnostic catheterization report. Pt is now referred for PCI to 95% mid-distal LAD stenosis.   Procedure:  5 Fr Right femoral sheath was exchanged and upgraded to 6 Fr sheath. Pt received additional 1 mg Versed. Angiomax bolus and infusion and 180 mg brilinta was administered. Prowater wire was advanced to the distal LAD after ACT was deteminined to be therapeutic. Predilation was done with a 2.0 x 12 mm MiniTrek balloon. Xience Xpedition 2.25 x 15 DES stent was deployed at 9 and 10 atm. Post-stent dilatation was done with a 2.25 x 12 Fingal Trek at 10 and 12 atm. Excellent angiographic result. Pt received multiple doses of IC NTG during the procedure.Sheath was sutured in place for sheath removal later today.  AO: 127/67  See Dr. Blanchie Dessert cath findings and my diagram in chart.  Mid-distal LAD stenosis of 95% reduced to 0, without evidence for dissection and with TIMI 3 flow. No change in very distal 30 - 40% stenosis and distal 80 % post apical stenosis. Pt tolerated the procedure well.  Lennette Bihari, MD, [facility] 03/17/2012 2:24 PM

## 2012-03-17 NOTE — CV Procedure (Addendum)
[facility]     CARDIAC CATHETERIZATION REPORT  Robert Shaw   [id number] 1950/07/22  Performing Cardiologist: Chrystie Nose Primary Physician: Avon Gully, MD Primary Cardiologist:  Dr. Rennis Golden  Procedures Performed:  Left Heart Catheterization via 5 Fr right femoral artery access  Left Ventriculography, (RAO/LAO) 15 ml/sec for 30 ml total contrast  Native Coronary Angiography  Indication(s): NSTEMI  History: 62 y.o. male with a history of CAD s/p PCI to the proximal D1 and proximal LCx in 2003. A repeat cath for chest pain was performed in 2011, which showed patent stents and ~40% proximal RCA stenosis.  He has done fairly well until recently he developed chest pain while eating which did not resolve. He presented to [facility] and ruled-in for NSTEMI.  He is referred for cardiac catheterization.  Consent: The procedure with Risks/Benefits/Alternatives and Indications was reviewed with the patient (and family).  All questions were answered.    Risks / Complications include, but not limited to: Death, MI, CVA/TIA, VF/VT (with defibrillation), Bradycardia (need for temporary pacer placement), contrast induced nephropathy, bleeding / bruising / hematoma / pseudoaneurysm, vascular or coronary injury (with possible emergent CT or Vascular Surgery), adverse medication reactions, infection.    The patient (and family) voice understanding and agree to proceed.    Risks of procedure as well as the alternatives and risks of each were explained to the (patient/caregiver).  Consent for procedure obtained. Consent for signed by MD and patient with RN witness -- placed on chart.  Procedure: The patient was brought to the 2nd Floor [place] [facility] in the fasting state and prepped and draped in the usual sterile fashion for (Right groin) access.   Sterile technique was used including antiseptics, cap, gloves, gown, hand hygiene, mask and  sheet.  Skin prep: Chlorhexidine;  Time Out: Verified patient identification, verified procedure, site/side was marked, verified correct patient position, special equipment/implants available, medications/allergies/relevent history reviewed, required imaging and test results available.  Performed  The right femoral head was identified using tactile and fluoroscopic technique.  The right groin was anesthetized with 1% subcutaneous Lidocaine.  The right Common Femoral Artery was accessed using the Modified Seldinger Technique with placement of (5 Fr) sheath using the Seldinger technique.  The sheath was aspirated and flushed.  A 5 Fr JL4 Catheter was advanced of over a Standard J wire into the ascending Aorta.  The catheter was used to engage the left coronary artery.  Multiple cineangiographic views of the left coronary artery system(s) were performed. A 5 Fr JR4 Catheter was advanced of over a Safety J wire into the ascending Aorta.  The catheter was used to engage the right coronary artery.  Multiple cineangiographic views of the right coronary artery system(s) were performed. This catheter was then exchanged over the Standard J wire for an angled Pigtail catheter that was advanced across the Aortic Valve.  LV hemodynamics were measured (and) Left Ventriculography was performed.  LV hemodynamics were then re-sampled, and the catheter was pulled back across the Aortic Valve for measurement of "pull-back" gradient.  The catheter and the wire was removed completely out of the body.  It was then determined the patient needed a PCI to the LAD. I contacted Dr. Tresa Endo who will perform that now.  Please see his note.  The patient was transported to the cath lab holding area in stable condition.   The patient  was stable before, during and following the procedure.   Patient did  tolerate procedure well. There were not complications.  EBL: <10 cc  Medications:  Premedication: none  Sedation:  2 mg IV Versed,  50 IV mcg Fentanyl  Contrast:  75 cc Omnipaque  Hemodynamics:  Central Aortic Pressure / Mean Aortic Pressure: 127/65  LV Pressure / LV End diastolic Pressure:  11  Left Ventriculography:  EF:  60%  Wall Motion: no regional WMA's  Coronary Angiographic Data:  Left Main:  Angiographically normal  Left Anterior Descending (LAD):  3.5 mm vessel which tapers and wraps around the apex. There is a 95% stenosis of the mid-LAD which is likely culprit. There is a 95%stenosis of the very distal segment of the LAD (which is a <2.0 mm vessel at that point).  1st diagonal (D1):  Larger vessel. There is a proximal BMS with approximately 20% diffuse ISR, but otherwise patent.  2nd diagonal (D2):  Smaller vessel <2.0 mm with ostial stenosis.  3rd diagonal (D3): Small vessel with tubular ostial stenosis.  Circumflex (LCx):  There is a proximal stent with 20-30% ISR, but the remainder of the LCx is patent.  1st obtuse marginal:  No significant stenosis.  Right Coronary Artery: There is a 50% proximal stenosis (perhaps slightly worse than in 2011), however, it is non-obstructive.  posterior descending artery: No significant stenosis.  posterior lateral branch:  Small amount of distal stenosis.  Impression: 1.  Severe, discrete 95% stenosis of the mid LAD. 2.  Patent proximal D1 and LCx stents with minimal ISR. 3.  LVEF >60%, no focal WMA's. 4.  LVEDP = 11 mmHg  Plan: 1.  Discussed with Dr. Tresa Endo, will proceed with primary PCI of the mid-LAD with a DES. 2.  Load with brillinta, will switch to that from plavix.  The case and results was discussed with the patient (and family). The case and results was not discussed with the patient's PCP. The case and results was discussed with the patient's Cardiologist.  Time Spend Directly with Patient:  45 minutes  Chrystie Nose, MD, [facility] Attending Cardiologist [facility]  Ysmael Hires C 03/17/2012, 1:20 PM

## 2012-03-17 NOTE — Plan of Care (Signed)
Problem: Phase II Progression Outcomes Goal: Anginal pain relieved Outcome: Completed/Met Date Met:  03/17/12 Denies of chest pain.  Problem: Phase III Progression Outcomes Goal: Cath/PCI Path as indicated Outcome: Progressing Pt is currently at cath lab

## 2012-03-17 NOTE — H&P (Signed)
     [facility]          INTERVAL PROCEDURE H&P   History and Physical Interval Note:  03/17/2012 8:59 AM  Robert Shaw has presented today for their planned procedure. The various methods of treatment have been discussed with the patient and family. After consideration of risks, benefits and other options for treatment, the patient has consented to the procedure.  The patients' outpatient history has been reviewed, patient examined, and no change in status from most recent office note within the past 30 days. I have reviewed the patients' chart and labs and will proceed as planned. Questions were answered to the patient's satisfaction.   Chrystie Nose, MD, [facility] Attending Cardiologist [facility]  Kaoir Loree C 03/17/2012, 8:59 AM

## 2012-03-17 NOTE — Progress Notes (Signed)
Site area: right groin  Site Prior to Removal:  Level 0  Pressure Applied For 20 MINUTES    Minutes Beginning at   Manual:   yes  Patient Status During Pull:  stable  Post Pull Groin Site:  Level 0  Post Pull Instructions Given:  yes  Post Pull Pulses Present:  yes  Dressing Applied:  yes  Comments:  Patient very anxious refuses to BP cuff and need constant reminders to lay still.

## 2012-03-17 NOTE — Progress Notes (Signed)
ANTICOAGULATION CONSULT NOTE - Follow Up Consult  Pharmacy Consult for heparin Indication: NSTEMI  Labs:  Basename 03/17/12 0530 03/16/12 0433 03/15/12 1953 03/15/12 1445 03/15/12 1336 03/15/12 0650 03/15/12 0210 03/15/12 0150 03/14/12 2000  HGB 12.1* 12.2* -- -- -- -- -- -- --  HCT 34.5* 35.5* -- -- -- -- 34.5* -- --  PLT 133* 144* -- -- -- -- 156 -- --  APTT -- -- -- -- -- -- -- -- --  LABPROT 13.2 -- -- 14.4 -- -- -- -- --  INR 1.01 -- -- 1.14 -- -- -- -- --  HEPARINUNFRC 0.29* 0.62 0.65 -- -- -- -- -- --  CREATININE -- -- -- -- -- -- 1.37* -- 1.36*  CKTOTAL -- -- -- -- -- -- -- -- --  CKMB -- -- -- -- -- -- -- -- --  TROPONINI -- -- -- -- 0.44* 0.49* -- 0.72* --    Assessment: 62yo male now slightly subtherapeutic on heparin after stable therapeutic levels at current rate; scheduled for cath at noon today.  Goal of Therapy:  Heparin level 0.3-0.7 units/ml   Plan:  Will increase heparin gtt slightly to 950 units/hr and f/u after cath.  Colleen Can PharmD BCPS 03/17/2012,6:30 AM

## 2012-03-18 ENCOUNTER — Encounter (HOSPITAL_COMMUNITY): Payer: Self-pay | Admitting: Cardiology

## 2012-03-18 DIAGNOSIS — R001 Bradycardia, unspecified: Secondary | ICD-10-CM

## 2012-03-18 HISTORY — DX: Bradycardia, unspecified: R00.1

## 2012-03-18 LAB — BASIC METABOLIC PANEL
CO2: 22 mEq/L (ref 19–32)
Calcium: 8.7 mg/dL (ref 8.4–10.5)
Chloride: 109 mEq/L (ref 96–112)
Glucose, Bld: 120 mg/dL — ABNORMAL HIGH (ref 70–99)
Potassium: 3.5 mEq/L (ref 3.5–5.1)
Sodium: 140 mEq/L (ref 135–145)

## 2012-03-18 LAB — CBC
Hemoglobin: 11.6 g/dL — ABNORMAL LOW (ref 13.0–17.0)
MCH: 30.9 pg (ref 26.0–34.0)
RBC: 3.75 MIL/uL — ABNORMAL LOW (ref 4.22–5.81)
WBC: 6.2 10*3/uL (ref 4.0–10.5)

## 2012-03-18 MED ORDER — TICAGRELOR 90 MG PO TABS: 90.0000 mg | ORAL_TABLET | Freq: Two times a day (BID) | ORAL | Status: DC

## 2012-03-18 MED ORDER — HEART ATTACK BOUNCING BOOK
Freq: Once | Status: AC
  Administered 2012-03-18: 12:00:00
  Filled 2012-03-18: qty 1

## 2012-03-18 MED ORDER — NITROGLYCERIN 0.4 MG SL SUBL: 0.4000 mg | SUBLINGUAL_TABLET | SUBLINGUAL | Status: DC | PRN

## 2012-03-18 MED ORDER — ISOSORBIDE MONONITRATE ER 30 MG PO TB24: 30.0000 mg | ORAL_TABLET | Freq: Every day | ORAL | Status: DC

## 2012-03-18 MED ORDER — METOPROLOL TARTRATE 12.5 MG HALF TABLET
12.5000 mg | ORAL_TABLET | Freq: Two times a day (BID) | ORAL | Status: DC
  Filled 2012-03-18: qty 1

## 2012-03-18 MED ORDER — METOPROLOL TARTRATE 25 MG PO TABS: 12.5000 mg | ORAL_TABLET | Freq: Two times a day (BID) | ORAL | Status: DC

## 2012-03-18 MED FILL — Dextrose Inj 5%: INTRAVENOUS | Qty: 50 | Status: AC

## 2012-03-18 NOTE — Progress Notes (Signed)
Subjective:  No CP/SOB S/P LAD PCI/Stent with DES  Objective:  Temp:  [97.1 F (36.2 C)-98.2 F (36.8 C)] 97.1 F (36.2 C) (02/04 0846) Pulse Rate:  [45-65] 52  (02/04 0846) Resp:  [6-22] 19  (02/04 0846) BP: (109-151)/(62-83) 109/62 mmHg (02/04 0846) SpO2:  [67 %-99 %] 97 % (02/04 0846) Weight:  [79.5 kg (175 lb 4.3 oz)] 79.5 kg (175 lb 4.3 oz) (02/04 0510) Weight change: 0.5 kg (1 lb 1.6 oz)  Intake/Output from previous day: 02/03 0701 - 02/04 0700 In: 895.2 [I.V.:895.2] Out: 1400 [Urine:1400]  Intake/Output from this shift:    Physical Exam:   Lab Results: Results for orders placed during the hospital encounter of 03/14/12 (from the past 48 hour(s))  HEPARIN LEVEL (UNFRACTIONATED)     Status: Abnormal   Collection Time   03/17/12  5:30 AM      Component Value Range Comment   Heparin Unfractionated 0.29 (*) 0.30 - 0.70 IU/mL   CBC     Status: Abnormal   Collection Time   03/17/12  5:30 AM      Component Value Range Comment   WBC 4.6  4.0 - 10.5 K/uL    RBC 3.88 (*) 4.22 - 5.81 MIL/uL    Hemoglobin 12.1 (*) 13.0 - 17.0 g/dL    HCT 21.3 (*) 08.6 - 52.0 %    MCV 88.9  78.0 - 100.0 fL    MCH 31.2  26.0 - 34.0 pg    MCHC 35.1  30.0 - 36.0 g/dL    RDW 57.8  46.9 - 62.9 %    Platelets 133 (*) 150 - 400 K/uL   BASIC METABOLIC PANEL     Status: Abnormal   Collection Time   03/17/12  5:30 AM      Component Value Range Comment   Sodium 138  135 - 145 mEq/L    Potassium 3.8  3.5 - 5.1 mEq/L    Chloride 109  96 - 112 mEq/L    CO2 22  19 - 32 mEq/L    Glucose, Bld 102 (*) 70 - 99 mg/dL    BUN 11  6 - 23 mg/dL    Creatinine, Ser 5.28  0.50 - 1.35 mg/dL    Calcium 8.3 (*) 8.4 - 10.5 mg/dL    GFR calc non Af Amer 65 (*) >90 mL/min    GFR calc Af Amer 75 (*) >90 mL/min   PROTIME-INR     Status: Normal   Collection Time   03/17/12  5:30 AM      Component Value Range Comment   Prothrombin Time 13.2  11.6 - 15.2 seconds    INR 1.01  0.00 - 1.49   POCT ACTIVATED CLOTTING TIME      Status: Normal   Collection Time   03/17/12 12:56 PM      Component Value Range Comment   Activated Clotting Time 143     POCT ACTIVATED CLOTTING TIME     Status: Normal   Collection Time   03/17/12  1:34 PM      Component Value Range Comment   Activated Clotting Time 513     CBC     Status: Abnormal   Collection Time   03/18/12  4:30 AM      Component Value Range Comment   WBC 6.2  4.0 - 10.5 K/uL    RBC 3.75 (*) 4.22 - 5.81 MIL/uL    Hemoglobin 11.6 (*) 13.0 - 17.0 g/dL  HCT 33.0 (*) 39.0 - 52.0 %    MCV 88.0  78.0 - 100.0 fL    MCH 30.9  26.0 - 34.0 pg    MCHC 35.2  30.0 - 36.0 g/dL    RDW 60.4  54.0 - 98.1 %    Platelets 132 (*) 150 - 400 K/uL   BASIC METABOLIC PANEL     Status: Abnormal   Collection Time   03/18/12  4:30 AM      Component Value Range Comment   Sodium 140  135 - 145 mEq/L    Potassium 3.5  3.5 - 5.1 mEq/L    Chloride 109  96 - 112 mEq/L    CO2 22  19 - 32 mEq/L    Glucose, Bld 120 (*) 70 - 99 mg/dL    BUN 13  6 - 23 mg/dL    Creatinine, Ser 1.91  0.50 - 1.35 mg/dL    Calcium 8.7  8.4 - 47.8 mg/dL    GFR calc non Af Amer 70 (*) >90 mL/min    GFR calc Af Amer 82 (*) >90 mL/min     Imaging: Imaging results have been reviewed  Assessment/Plan:   1. Principal Problem: 2.  *NSTEMI (non-ST elevated myocardial infarction) 3. Active Problems: 4.  CAD (coronary artery disease) 5.  CKD (chronic kidney disease) stage 2, GFR 60-89 ml/min 6.   Time Spent Directly with Patient:  20 minutes  Length of Stay:  LOS: 4 days   S/P mid LAD PCI/Stent with DES. NSTEMI. Peak trop .44. No CP/SOB. OK to D/C home on ASA and Brilenta. ROV with [facility] in [place] 1-2 weeks.  Runell Gess 03/18/2012, 9:04 AM

## 2012-03-18 NOTE — Discharge Summary (Signed)
Physician Discharge Summary  Patient ID: Robert Shaw MRN: [id number] DOB/AGE: 11/04/1950 62 y.o.  Admit date: 03/14/2012 Discharge date: 03/18/2012  Discharge Diagnoses:  Principal Problem:  *NSTEMI (non-ST elevated myocardial infarction), 03/14/12 Active Problems:  CAD (coronary artery disease), stent-DES-Xience to mid LAD 03/17/2012 with residual disease distally  CKD (chronic kidney disease) stage 2, GFR 60-89 ml/min  Bradycardia   Discharged Condition: good  Procedures: 03/17/12 cardiac cath by Dr. Rennis Golden PCI 03/17/12 by Dr. Rachelle Hora Course: 62 y.o. male with a past medical history of coronary artery disease, who has had stents placed to the circumflex and diagonal in 2003. He had a cardiac catheterization in 2011, which showed patent stents with other CAD. He was in his usual state of health till 8 PM 03/14/12 when he was in his house doing nonexertional activity. He started developing pressure-like sensation in the retrosternal area. The achiness radiated to both the arms. He had shortness of breath but no dizziness. The pain was 8/10 in intensity. Denied any cough and denied any palpitations, or diaphoresis. Denied any leg swelling. Denied any acid reflux. He took aspirin and cholesterol medications at home, but did not take any nitroglycerin. The pain was severe enough that he decided to come in to the hospital. In the emergency department he was given one nitroglycerin with which his pain is down to 0/10. He was no longer short of breath. He was admitted to stepdown for further management.  Troponin I peaked at 0.72 consistent with NSTEMI. He was on IV Heparin and topical nitro paste. Scheduled for a cardiac cath.    Cath revealed 1. Severe, discrete 95% stenosis of the mid LAD. 2. Patent proximal D1 and LCx stents with minimal ISR.  3. LVEF >60%, no focal WMA's. 4. LVEDP = 11 mmHg.  Pt was switched from his Plavix to Brilinta.  Then he underwent PCI to mid lad by Dr. Tresa Endo.  DES  Xience stent was placed.  The next am pt. Ambulated without complaints.  Labs were stable.  HR was in the 40's on lopressor 25 BID,  We decreased dose to 12.5 BID - Dr. Rennis Golden to address if he should return to his atenolol 25 mg daily as outpatient.   Pt's EF 60%.  Pt was seen and evaluated by Dr. Allyson Sabal and found stable for discharge home.   Consults: None  Significant Diagnostic Studies:  BMET    Component Value Date/Time   NA 140 03/18/2012 0430   K 3.5 03/18/2012 0430   CL 109 03/18/2012 0430   CO2 22 03/18/2012 0430   GLUCOSE 120* 03/18/2012 0430   BUN 13 03/18/2012 0430   CREATININE 1.11 03/18/2012 0430   CALCIUM 8.7 03/18/2012 0430   GFRNONAA 70* 03/18/2012 0430   GFRAA 82* 03/18/2012 0430    CBC    Component Value Date/Time   WBC 6.2 03/18/2012 0430   RBC 3.75* 03/18/2012 0430   HGB 11.6* 03/18/2012 0430   HCT 33.0* 03/18/2012 0430   PLT 132* 03/18/2012 0430   MCV 88.0 03/18/2012 0430   MCH 30.9 03/18/2012 0430   MCHC 35.2 03/18/2012 0430   RDW 12.9 03/18/2012 0430   LYMPHSABS 2.7 03/14/2012 2000   MONOABS 0.4 03/14/2012 2000   EOSABS 0.1 03/14/2012 2000   BASOSABS 0.0 03/14/2012 2000    Pk troponin   0.72. Tot. Chol 115, TG 227, HDL 28, LDL 42   PCXR:  03/15/12:  Comparison: 09/07/2009  Findings: Normal heart size and pulmonary vascularity.  Slightly  shallow inspiration with underlying appearance of hyperinflation,  likely due to emphysema. Scattered fibrosis in the perihilar  regions. Linear atelectasis in the left lung base. No focal  consolidation. No blunting of costophrenic angles. No  pneumothorax. Mediastinal contours appear intact.  IMPRESSION:  Chronic emphysematous changes and scattered fibrosis. Linear  atelectasis in the left base. No focal consolidation.   EKG day of discharge with t wave inversions leads II, V3,4,5,6 similar to post procedure.   Discharge Exam: Blood pressure 109/62, pulse 52, temperature 97.1 F (36.2 C), temperature source Axillary, resp. rate 19, height 6'  (1.829 m), weight 79.5 kg (175 lb 4.3 oz), SpO2 97.00%.    Disposition: Home     Medication List     As of 03/18/2012 10:45 AM    STOP taking these medications         atenolol 25 MG tablet   Commonly known as: TENORMIN      clopidogrel 75 MG tablet   Commonly known as: PLAVIX      TAKE these medications         aspirin EC 81 MG tablet   Take 81 mg by mouth daily.      isosorbide mononitrate 30 MG 24 hr tablet   Commonly known as: IMDUR   Take 1 tablet (30 mg total) by mouth daily.      metoprolol tartrate 25 MG tablet   Commonly known as: LOPRESSOR   Take 0.5 tablets (12.5 mg total) by mouth 2 (two) times daily.      nitroGLYCERIN 0.4 MG SL tablet   Commonly known as: NITROSTAT   Place 1 tablet (0.4 mg total) under the tongue every 5 (five) minutes as needed for chest pain.      rosuvastatin 10 MG tablet   Commonly known as: CRESTOR   Take 10 mg by mouth daily.      Ticagrelor 90 MG Tabs tablet   Commonly known as: BRILINTA   Take 1 tablet (90 mg total) by mouth 2 (two) times daily.        Follow-up Information    Follow up with Chrystie Nose, MD. On 04/03/2012. (at 10:45 am in [place] )    Contact information:   [address] [place] [address] [phone]        Discharge Instructions: Heart Healthy diet  Call [facility] if any bleeding, swelling or drainage at cath site.  May shower, no tub baths for 48 hours for groin sticks.   Take 1 NTG, under your tongue, while sitting.  If no relief of pain may repeat NTG, one tab every 5 minutes up to 3 tablets total over 15 minutes.  If no relief CALL 911.  If you have dizziness/lightheadness  while taking NTG, stop taking and call 911.         No lifting for 2 days over 5 pounds, no driving for 2 days.  STOP PLAVIX AND USE BRILINTA INSTEAD, YOU NEED DIFFERENT MED.  BUT DO NOT STOP BRILINTA STOPPING COULD CAUSE A HEART ATTACK. SignedLeone Brand 03/18/2012, 10:45 AM

## 2012-03-18 NOTE — Progress Notes (Signed)
CARDIAC REHAB PHASE I   PRE:  Rate/Rhythm: 53SB  BP:  Supine: 109/62  Sitting:   Standing:    SaO2:   MODE:  Ambulation: 300 ft   POST:  Rate/Rhythem: 69SR  BP:  Supine:   Sitting: 122/66  Standing:    SaO2:  0900-1000 Pt walked 300 ft with steady gait. Limped on right leg but stated it did not hurt. Denied CP. Tolerated well. To recliner after walk. Education completed with pt and wife. Discussed smoking cessation and handouts given. Encouraged pt to call 1800quit now as needed. Pt states he is going to try to quit cold [place]. Pt states he attended CRP 2 before and wants to talk with cardiologist about whether he needs to do again. Did not want me to refer at this time.   Duanne Limerick

## 2012-08-18 ENCOUNTER — Telehealth: Payer: Self-pay | Admitting: Internal Medicine

## 2012-09-02 ENCOUNTER — Other Ambulatory Visit: Payer: Self-pay | Admitting: *Deleted

## 2012-09-02 DIAGNOSIS — I25810 Atherosclerosis of coronary artery bypass graft(s) without angina pectoris: Secondary | ICD-10-CM

## 2012-09-03 ENCOUNTER — Encounter: Payer: Self-pay | Admitting: Internal Medicine

## 2012-09-05 ENCOUNTER — Other Ambulatory Visit (HOSPITAL_COMMUNITY): Payer: 59

## 2012-09-26 ENCOUNTER — Encounter (HOSPITAL_COMMUNITY): Payer: 59

## 2012-10-01 ENCOUNTER — Other Ambulatory Visit (HOSPITAL_COMMUNITY): Payer: Self-pay | Admitting: Cardiology

## 2012-10-01 NOTE — Telephone Encounter (Signed)
Rx was sent to pharmacy electronically. 

## 2012-10-29 ENCOUNTER — Other Ambulatory Visit: Payer: Self-pay | Admitting: Internal Medicine

## 2012-10-30 ENCOUNTER — Encounter (HOSPITAL_COMMUNITY)
Admission: RE | Admit: 2012-10-30 | Discharge: 2012-10-30 | Disposition: A | Discharge status: Home / Self Care | Payer: 59 | Source: Ambulatory Visit | Attending: Cardiovascular Disease | Admitting: Cardiovascular Disease

## 2012-10-30 VITALS — BP 108/70 | HR 56 | Ht 72.0 in | Wt 165.5 lb

## 2012-10-30 DIAGNOSIS — I252 Old myocardial infarction: Secondary | ICD-10-CM | POA: Insufficient documentation

## 2012-10-30 DIAGNOSIS — I214 Non-ST elevation (NSTEMI) myocardial infarction: Secondary | ICD-10-CM

## 2012-10-30 DIAGNOSIS — Z9861 Coronary angioplasty status: Secondary | ICD-10-CM | POA: Insufficient documentation

## 2012-10-30 DIAGNOSIS — Z5189 Encounter for other specified aftercare: Secondary | ICD-10-CM | POA: Insufficient documentation

## 2012-10-30 NOTE — Patient Instructions (Signed)
Pt has finished orientation and is scheduled to start CR on 11/03/12 at 9:30 am. Pt has been instructed to arrive to class 15 minutes early for scheduled class. Pt has been instructed to wear comfortable clothing and shoes with rubber soles. Pt has been told to take their medications 1 hour prior to coming to class.  If the patient is not going to attend class, he/she has been instructed to call.

## 2012-10-30 NOTE — Progress Notes (Signed)
Patient was referred to CR by Dr. Rennis Golden due NSTEMI (410.70) and Stent placement (V45.82). During orientation advised patient on arrival and appointment times what to wear, what to do before, during and after exercise. Reviewed attendance and class policy. Talked about inclement weather and class consultation policy. Pt is scheduled to start Cardiac Rehab on 11/03/12 at 9:30 am. Pt was advised to come to class 5 minutes before class starts. He was also given instructions on meeting with the dietician and attending the Family Structure classes. Pt is eager to get started. Patient asked if he could do the walk test on his 1st day 11/03/12.

## 2012-10-31 ENCOUNTER — Telehealth: Payer: Self-pay

## 2012-10-31 DIAGNOSIS — E785 Hyperlipidemia, unspecified: Secondary | ICD-10-CM

## 2012-10-31 NOTE — Telephone Encounter (Signed)
Spoke with Mrs. Norlander. Let her know about patient's Crestor refill request and that patient needed labwork. She stated she would let him know and he will take care of it sometime next week. Patient has 9 tablets left. Lab slip will be faxed to [facility] in [place].

## 2012-11-03 ENCOUNTER — Encounter (HOSPITAL_COMMUNITY)
Admission: RE | Admit: 2012-11-03 | Discharge: 2012-11-03 | Disposition: A | Discharge status: Home / Self Care | Payer: 59 | Source: Ambulatory Visit | Attending: Cardiovascular Disease | Admitting: Cardiovascular Disease

## 2012-11-05 ENCOUNTER — Encounter (HOSPITAL_COMMUNITY)
Admission: RE | Admit: 2012-11-05 | Discharge: 2012-11-05 | Disposition: A | Discharge status: Home / Self Care | Payer: 59 | Source: Ambulatory Visit | Attending: Cardiovascular Disease | Admitting: Cardiovascular Disease

## 2012-11-05 LAB — LIPID PANEL: LDL Cholesterol: 61 mg/dL (ref 0–99)

## 2012-11-07 ENCOUNTER — Encounter (HOSPITAL_COMMUNITY)
Admission: RE | Admit: 2012-11-07 | Discharge: 2012-11-07 | Disposition: A | Discharge status: Home / Self Care | Payer: 59 | Source: Ambulatory Visit | Attending: Cardiovascular Disease | Admitting: Cardiovascular Disease

## 2012-11-07 NOTE — Telephone Encounter (Signed)
No changes. Continue current dose crestor.

## 2012-11-07 NOTE — Telephone Encounter (Signed)
Had lab work done. Should he stay on Crestor 10mg ? Please advise.

## 2012-11-10 ENCOUNTER — Encounter (HOSPITAL_COMMUNITY)
Admission: RE | Admit: 2012-11-10 | Discharge: 2012-11-10 | Disposition: A | Discharge status: Home / Self Care | Payer: 59 | Source: Ambulatory Visit | Attending: Cardiovascular Disease | Admitting: Cardiovascular Disease

## 2012-11-12 ENCOUNTER — Encounter (HOSPITAL_COMMUNITY)
Admission: RE | Admit: 2012-11-12 | Discharge: 2012-11-12 | Disposition: A | Discharge status: Home / Self Care | Payer: 59 | Source: Ambulatory Visit | Attending: Cardiovascular Disease | Admitting: Cardiovascular Disease

## 2012-11-12 DIAGNOSIS — I252 Old myocardial infarction: Secondary | ICD-10-CM | POA: Insufficient documentation

## 2012-11-12 DIAGNOSIS — Z5189 Encounter for other specified aftercare: Secondary | ICD-10-CM | POA: Insufficient documentation

## 2012-11-12 DIAGNOSIS — Z9861 Coronary angioplasty status: Secondary | ICD-10-CM | POA: Insufficient documentation

## 2012-11-14 ENCOUNTER — Encounter (HOSPITAL_COMMUNITY): Payer: 59

## 2012-11-17 ENCOUNTER — Encounter (HOSPITAL_COMMUNITY)
Admission: RE | Admit: 2012-11-17 | Discharge: 2012-11-17 | Disposition: A | Discharge status: Home / Self Care | Payer: 59 | Source: Ambulatory Visit | Attending: Cardiovascular Disease | Admitting: Cardiovascular Disease

## 2012-11-19 ENCOUNTER — Encounter (HOSPITAL_COMMUNITY)
Admission: RE | Admit: 2012-11-19 | Discharge: 2012-11-19 | Disposition: A | Discharge status: Home / Self Care | Payer: 59 | Source: Ambulatory Visit | Attending: Cardiovascular Disease | Admitting: Cardiovascular Disease

## 2012-11-21 ENCOUNTER — Encounter (HOSPITAL_COMMUNITY): Payer: 59

## 2012-11-24 ENCOUNTER — Encounter (HOSPITAL_COMMUNITY)
Admission: RE | Admit: 2012-11-24 | Discharge: 2012-11-24 | Disposition: A | Discharge status: Home / Self Care | Payer: 59 | Source: Ambulatory Visit | Attending: Cardiovascular Disease | Admitting: Cardiovascular Disease

## 2012-11-26 ENCOUNTER — Encounter (HOSPITAL_COMMUNITY)
Admission: RE | Admit: 2012-11-26 | Discharge: 2012-11-26 | Disposition: A | Discharge status: Home / Self Care | Payer: 59 | Source: Ambulatory Visit | Attending: Cardiovascular Disease | Admitting: Cardiovascular Disease

## 2012-11-28 ENCOUNTER — Encounter (HOSPITAL_COMMUNITY): Payer: 59

## 2012-12-01 ENCOUNTER — Encounter (HOSPITAL_COMMUNITY)
Admission: RE | Admit: 2012-12-01 | Discharge: 2012-12-01 | Disposition: A | Discharge status: Home / Self Care | Payer: 59 | Source: Ambulatory Visit | Attending: Cardiovascular Disease | Admitting: Cardiovascular Disease

## 2012-12-03 ENCOUNTER — Encounter (HOSPITAL_COMMUNITY)
Admission: RE | Admit: 2012-12-03 | Discharge: 2012-12-03 | Disposition: A | Discharge status: Home / Self Care | Payer: 59 | Source: Ambulatory Visit | Attending: Cardiovascular Disease | Admitting: Cardiovascular Disease

## 2012-12-05 ENCOUNTER — Encounter (HOSPITAL_COMMUNITY): Payer: 59

## 2012-12-08 ENCOUNTER — Encounter (HOSPITAL_COMMUNITY): Payer: 59

## 2012-12-10 ENCOUNTER — Encounter (HOSPITAL_COMMUNITY)
Admission: RE | Admit: 2012-12-10 | Discharge: 2012-12-10 | Disposition: A | Discharge status: Home / Self Care | Payer: 59 | Source: Ambulatory Visit | Attending: Cardiovascular Disease | Admitting: Cardiovascular Disease

## 2012-12-12 ENCOUNTER — Encounter (HOSPITAL_COMMUNITY): Payer: 59

## 2012-12-12 ENCOUNTER — Other Ambulatory Visit: Payer: Self-pay | Admitting: Internal Medicine

## 2012-12-15 ENCOUNTER — Encounter (HOSPITAL_COMMUNITY)
Admission: RE | Admit: 2012-12-15 | Discharge: 2012-12-15 | Disposition: A | Discharge status: Home / Self Care | Payer: 59 | Source: Ambulatory Visit | Attending: Cardiovascular Disease | Admitting: Cardiovascular Disease

## 2012-12-15 DIAGNOSIS — Z9861 Coronary angioplasty status: Secondary | ICD-10-CM | POA: Insufficient documentation

## 2012-12-15 DIAGNOSIS — I252 Old myocardial infarction: Secondary | ICD-10-CM | POA: Insufficient documentation

## 2012-12-15 DIAGNOSIS — Z5189 Encounter for other specified aftercare: Secondary | ICD-10-CM | POA: Insufficient documentation

## 2012-12-15 NOTE — Telephone Encounter (Signed)
Rx was sent to pharmacy electronically. 

## 2012-12-17 ENCOUNTER — Encounter (HOSPITAL_COMMUNITY)
Admission: RE | Admit: 2012-12-17 | Discharge: 2012-12-17 | Disposition: A | Discharge status: Home / Self Care | Payer: 59 | Source: Ambulatory Visit | Attending: Cardiovascular Disease | Admitting: Cardiovascular Disease

## 2012-12-19 ENCOUNTER — Encounter (HOSPITAL_COMMUNITY): Payer: 59

## 2012-12-22 ENCOUNTER — Encounter (HOSPITAL_COMMUNITY)
Admission: RE | Admit: 2012-12-22 | Discharge: 2012-12-22 | Disposition: A | Discharge status: Home / Self Care | Payer: 59 | Source: Ambulatory Visit | Attending: Cardiovascular Disease | Admitting: Cardiovascular Disease

## 2012-12-24 ENCOUNTER — Encounter (HOSPITAL_COMMUNITY)
Admission: RE | Admit: 2012-12-24 | Discharge: 2012-12-24 | Disposition: A | Discharge status: Home / Self Care | Payer: 59 | Source: Ambulatory Visit | Attending: Cardiovascular Disease | Admitting: Cardiovascular Disease

## 2012-12-26 ENCOUNTER — Encounter (HOSPITAL_COMMUNITY): Payer: 59

## 2012-12-26 ENCOUNTER — Encounter: Payer: Self-pay | Admitting: *Deleted

## 2012-12-29 ENCOUNTER — Encounter (HOSPITAL_COMMUNITY)
Admission: RE | Admit: 2012-12-29 | Discharge: 2012-12-29 | Disposition: A | Discharge status: Home / Self Care | Payer: 59 | Source: Ambulatory Visit | Attending: Cardiovascular Disease | Admitting: Cardiovascular Disease

## 2012-12-30 ENCOUNTER — Ambulatory Visit (INDEPENDENT_AMBULATORY_CARE_PROVIDER_SITE_OTHER): Payer: 59 | Admitting: Internal Medicine

## 2012-12-30 ENCOUNTER — Encounter: Payer: Self-pay | Admitting: Internal Medicine

## 2012-12-30 VITALS — BP 100/60 | HR 54 | Ht 72.0 in | Wt 171.0 lb

## 2012-12-30 DIAGNOSIS — N182 Chronic kidney disease, stage 2 (mild): Secondary | ICD-10-CM

## 2012-12-30 DIAGNOSIS — I2510 Atherosclerotic heart disease of native coronary artery without angina pectoris: Secondary | ICD-10-CM

## 2012-12-30 DIAGNOSIS — F17200 Nicotine dependence, unspecified, uncomplicated: Secondary | ICD-10-CM | POA: Insufficient documentation

## 2012-12-30 DIAGNOSIS — I214 Non-ST elevation (NSTEMI) myocardial infarction: Secondary | ICD-10-CM

## 2012-12-30 DIAGNOSIS — E785 Hyperlipidemia, unspecified: Secondary | ICD-10-CM

## 2012-12-30 NOTE — Patient Instructions (Signed)
Stop ISOSORBIDE.  Please have fasting blood work in 3 months, prior to your next office visit with Dr. Rennis Golden.  Your physician recommends that you schedule a follow-up appointment in: 3 months.

## 2012-12-30 NOTE — Progress Notes (Signed)
OFFICE NOTE  Chief Complaint:  Routine follow-up, numbness in 2 toes on his right foot  Primary Care Physician: Avon Gully, MD  HPI:  Robert Shaw is a 62 year old gentleman whom I have been following for a history of coronary disease. He had a stent in his circumflex and diagonal in 2003 and had moderate disease in the LAD and the right coronary. He continues to smoke, which I have talked to him about as a marked risk factor, and he has been working on some exercise. Unfortunately, on January 31 he had an episode when he developed chest pain, somewhat after eating with some shortness of breath, that went to both arms. He took some aspirin at home and the pain did not go away. He, therefore, presented to the emergency department. He was found to have an elevated troponin of 0.72 and underwent cardiac catheterization. That catheterization, performed by me, showed a severe discrete 95% stenosis in the mid LAD. Both the stents in the diagonal and circumflex were patent with minimal in-stent restenosis. The EF was greater than 60% with no focal wall motion abnormalities. The patient then underwent a Xience drug-eluting stent placement to the mid LAD on March 17, 2012, by Dr. Tresa Endo. This was successful at reducing the lesion to 0% stenosis.  Since his hospitalization he has done very well. He has had no further chest pain. He was switched successfully from Plavix over to Brilinta, which he is taking with low-dose aspirin. His only complaint now is that he is having some numbness and 2 toes on his right foot. He reports he had problems with his right leg after the catheterization saying that he developed a foot drop but that has progressively gotten better over the past year.  He had no hematoma, bruit or catheterization complications that we were aware of at the time. Unfortunately, he continues to smoke, which is an ongoing risk factor.  PMHx:  Past Medical History  Diagnosis Date  . Coronary  artery disease   . 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 Family history of heart disease   . Hypertension   . Dyslipidemia   . Tobacco abuse   . NSTEMI (non-ST elevated myocardial infarction)     h/o  . History of nuclear stress test 07/13/2008    dipyridamole; normal perfusion, no ischemia, low risk     Past Surgical History  Procedure Laterality Date  . Cardiac catheterization  04/2001    after high lateral MI, preserved LV function w/mild mid anterolateral hypokinesia, diffuse coronary irregularity with signficiant obstruction in LAD, OM1, superior branch of ramus (Dr. Baxter Hire - [facility])  . Coronary angioplasty with stent placement  06/19/2001    Cfx (3.5x19mm Zeta stent) & diagonal stenting (2.5x67mm pixel stent) w/moderate LAD disease (Dr. Bishop Limbo)   . Cardiac catheterization  09/23/2001    patent stents from 06/2001 (Dr. Evlyn Courier)  . Cardiac catheterization  09/17/2002    no restenosis at prior intervention sites (Dr. Bishop Limbo)  . Cardiac catheterization  09/09/2009    no significant CAD, patent stents in OM1 & LAD (Dr. Bishop Limbo)   . Coronary angioplasty with stent placement  03/17/2012    Xience DES (2.25x69mm) of mid-distal LAD (Dr. Bishop Limbo)  . Transthoracic echocardiogram  03/29/2006    EF normal; mild MR & TR; AV mildly sclerotic    FAMHx:  Family History  Problem Relation Age of Onset  . Heart disease Brother     x3  .  Stroke Father     SOCHx:   reports that he has been smoking Cigarettes.  He has a 4 pack-year smoking history. He has never used smokeless tobacco. He reports that he does not drink alcohol or use illicit drugs.  ALLERGIES:  No Known Allergies  ROS: A comprehensive review of systems was negative except for: Musculoskeletal: positive for right foot numbness  HOME MEDS: Current Outpatient Prescriptions  Medication Sig Dispense Refill  . aspirin EC 81 MG tablet Take 81 mg by mouth daily.      . metoprolol tartrate (LOPRESSOR) 25 MG  tablet TAKE ONE-HALF TABLET BY MOUTH TWICE DAILY  30 tablet  6  . nitroGLYCERIN (NITROSTAT) 0.4 MG SL tablet Place 1 tablet (0.4 mg total) under the tongue every 5 (five) minutes as needed for chest pain.  25 tablet  4  . rosuvastatin (CRESTOR) 10 MG tablet Take 1 tablet (10 mg total) by mouth daily.  30 tablet  6  . Ticagrelor (BRILINTA) 90 MG TABS tablet Take 1 tablet (90 mg total) by mouth 2 (two) times daily.  60 tablet  11   No current facility-administered medications for this visit.    LABS/IMAGING: No results found for this or any previous visit (from the past 48 hour(s)). No results found.  VITALS: BP 100/60  Pulse 54  Ht 6' (1.829 m)  Wt 171 lb (77.565 kg)  BMI 23.19 kg/m2  EXAM: General appearance: alert and no distress Neck: no carotid bruit and no JVD Lungs: clear to auscultation bilaterally Heart: regular rate and rhythm, S1, S2 normal, no murmur, click, rub or gallop Abdomen: soft, non-tender; bowel sounds normal; no masses,  no organomegaly Extremities: extremities normal, atraumatic, no cyanosis or edema Pulses: 2+ and symmetric Skin: Skin color, texture, turgor normal. No rashes or lesions Neurologic: Grossly normal Psych: Mood, affect normal  EKG: Sinus bradycardia with lateral T wave inversions  ASSESSMENT: 1. Coronary artery disease status post PCI to the mid to distal LAD with a Xience DES 2. Dyslipidemia 3. Hypertension 4. Tobacco dependence 5. Right foot peripheral neuropathy-improving  PLAN: 1.   Mr. Driskill is doing well without any chest pain. Unfortunately continues to smoke. He is undergoing cardiac rehabilitation and exercising without difficulty. He is reporting some problems with erectile dysfunction and his record about possible treatment for that. I felt that we could take him off of his indoors he was having no chest pain. This would potentially allow him to take Viagra or Cialis. After discussing this he decided that he did not want to take  those medicines. I do however believe we can discontinue his isosorbide and recommended that today. He should continue on his aspirin and Brillinta until February. He had not mentioned his right foot drop after his initial catheterization. It's unclear whether this was a result of any nerve injury or perhaps related to lying supine on the cath table. His symptoms have improved and he only has a small amount of numbness into toes. I've encouraged him to continue to allow this to heal which could be another 6 months. If his symptoms aren't totally improved at that time, I would recommend a neurological evaluation. I will plan to see him in 3 months and check a lipid profile at that time.  Chrystie Nose, MD, [facility] Attending Cardiologist [facility]  Laniece Hornbaker C 12/30/2012, 11:50 AM

## 2012-12-31 ENCOUNTER — Encounter (HOSPITAL_COMMUNITY)
Admission: RE | Admit: 2012-12-31 | Discharge: 2012-12-31 | Disposition: A | Discharge status: Home / Self Care | Payer: 59 | Source: Ambulatory Visit | Attending: Cardiovascular Disease | Admitting: Cardiovascular Disease

## 2013-01-02 ENCOUNTER — Encounter (HOSPITAL_COMMUNITY): Payer: 59

## 2013-01-05 ENCOUNTER — Encounter (HOSPITAL_COMMUNITY)
Admission: RE | Admit: 2013-01-05 | Discharge: 2013-01-05 | Disposition: A | Discharge status: Home / Self Care | Payer: 59 | Source: Ambulatory Visit | Attending: Cardiovascular Disease | Admitting: Cardiovascular Disease

## 2013-01-07 ENCOUNTER — Encounter (HOSPITAL_COMMUNITY): Payer: 59

## 2013-01-09 ENCOUNTER — Encounter (HOSPITAL_COMMUNITY): Payer: 59

## 2013-01-12 ENCOUNTER — Encounter (HOSPITAL_COMMUNITY)
Admission: RE | Admit: 2013-01-12 | Discharge: 2013-01-12 | Disposition: A | Discharge status: Home / Self Care | Payer: 59 | Source: Ambulatory Visit | Attending: Cardiovascular Disease | Admitting: Cardiovascular Disease

## 2013-01-12 DIAGNOSIS — Z9861 Coronary angioplasty status: Secondary | ICD-10-CM | POA: Insufficient documentation

## 2013-01-12 DIAGNOSIS — I252 Old myocardial infarction: Secondary | ICD-10-CM | POA: Insufficient documentation

## 2013-01-12 DIAGNOSIS — Z5189 Encounter for other specified aftercare: Secondary | ICD-10-CM | POA: Insufficient documentation

## 2013-01-14 ENCOUNTER — Encounter (HOSPITAL_COMMUNITY)
Admission: RE | Admit: 2013-01-14 | Discharge: 2013-01-14 | Disposition: A | Discharge status: Home / Self Care | Payer: 59 | Source: Ambulatory Visit | Attending: Cardiovascular Disease | Admitting: Cardiovascular Disease

## 2013-01-16 ENCOUNTER — Encounter (HOSPITAL_COMMUNITY): Payer: 59

## 2013-01-19 ENCOUNTER — Encounter (HOSPITAL_COMMUNITY)
Admission: RE | Admit: 2013-01-19 | Discharge: 2013-01-19 | Disposition: A | Discharge status: Home / Self Care | Payer: 59 | Source: Ambulatory Visit | Attending: Cardiovascular Disease | Admitting: Cardiovascular Disease

## 2013-01-21 ENCOUNTER — Encounter (HOSPITAL_COMMUNITY)
Admission: RE | Admit: 2013-01-21 | Discharge: 2013-01-21 | Disposition: A | Discharge status: Home / Self Care | Payer: 59 | Source: Ambulatory Visit | Attending: Cardiovascular Disease | Admitting: Cardiovascular Disease

## 2013-01-23 ENCOUNTER — Encounter (HOSPITAL_COMMUNITY): Payer: 59

## 2013-01-26 ENCOUNTER — Encounter (HOSPITAL_COMMUNITY)
Admission: RE | Admit: 2013-01-26 | Discharge: 2013-01-26 | Disposition: A | Discharge status: Home / Self Care | Payer: 59 | Source: Ambulatory Visit | Attending: Cardiovascular Disease | Admitting: Cardiovascular Disease

## 2013-01-28 ENCOUNTER — Encounter (HOSPITAL_COMMUNITY)
Admission: RE | Admit: 2013-01-28 | Discharge: 2013-01-28 | Disposition: A | Discharge status: Home / Self Care | Payer: 59 | Source: Ambulatory Visit | Attending: Cardiovascular Disease | Admitting: Cardiovascular Disease

## 2013-01-30 ENCOUNTER — Encounter (HOSPITAL_COMMUNITY): Payer: 59

## 2013-02-02 ENCOUNTER — Encounter (HOSPITAL_COMMUNITY)
Admission: RE | Admit: 2013-02-02 | Discharge: 2013-02-02 | Disposition: A | Discharge status: Home / Self Care | Payer: 59 | Source: Ambulatory Visit | Attending: Cardiovascular Disease | Admitting: Cardiovascular Disease

## 2013-02-04 ENCOUNTER — Encounter (HOSPITAL_COMMUNITY): Payer: 59

## 2013-02-06 ENCOUNTER — Encounter (HOSPITAL_COMMUNITY): Payer: 59

## 2013-02-09 ENCOUNTER — Encounter (HOSPITAL_COMMUNITY): Payer: 59

## 2013-02-11 ENCOUNTER — Encounter (HOSPITAL_COMMUNITY): Payer: 59

## 2013-02-13 ENCOUNTER — Encounter (HOSPITAL_COMMUNITY): Payer: 59

## 2013-02-16 ENCOUNTER — Encounter (HOSPITAL_COMMUNITY): Payer: 59

## 2013-02-18 ENCOUNTER — Encounter (HOSPITAL_COMMUNITY): Payer: 59

## 2013-02-20 ENCOUNTER — Encounter (HOSPITAL_COMMUNITY): Payer: 59

## 2013-02-23 ENCOUNTER — Encounter (HOSPITAL_COMMUNITY): Payer: 59

## 2013-02-25 ENCOUNTER — Encounter (HOSPITAL_COMMUNITY): Payer: 59

## 2013-02-27 ENCOUNTER — Encounter (HOSPITAL_COMMUNITY): Payer: 59

## 2013-03-02 ENCOUNTER — Encounter (HOSPITAL_COMMUNITY): Payer: 59

## 2013-03-04 ENCOUNTER — Encounter (HOSPITAL_COMMUNITY): Payer: 59

## 2013-03-06 ENCOUNTER — Encounter (HOSPITAL_COMMUNITY): Payer: 59

## 2013-03-09 ENCOUNTER — Encounter (HOSPITAL_COMMUNITY): Payer: 59

## 2013-03-10 NOTE — Addendum Note (Signed)
Encounter addended by: Norlene Duel, RN on: 03/10/2013 11:34 AM<BR>     Documentation filed: Notes Section

## 2013-03-10 NOTE — Progress Notes (Signed)
Cardiac Rehabilitation Program Outcomes Report   Orientation:  10/30/2012 Graduate Date:  NA Discharge Date:  02/02/2013 # of sessions completed: 26 out of 36 DX: Stent/NSTEMI  Cardiologist: Debara Pickett Family MD:  Morton Amy Time:  08:15  A.  Exercise Program:  Tolerates exercise @ 4.46 METS for 15 minutes, Walk Test Results:  Post: Did not do a post walk Test patient did not finish program and Discharged  B.  Mental Health:  Good mental attitude  C.  Education/Instruction/Skills  Accurately checks own pulse.  Rest:  59  Exercise:  93, Knows THR for exercise, Uses Perceived Exertion Scale and/or Dyspnea Scale and Attended 11 education classes  Uses Perceived Exertion Scale and/or Dyspnea Scale  D.  Nutrition/Weight Control/Body Composition:  Adherence to prescribed nutrition program: good    E.  Blood Lipids    Lab Results  Component Value Date   CHOL 116 11/05/2012   HDL 39* 11/05/2012   LDLCALC 61 11/05/2012   TRIG 82 11/05/2012   CHOLHDL 3.0 11/05/2012    F.  Lifestyle Changes:  Making positive lifestyle changes  G.  Symptoms noted with exercise:  Asymptomatic  Report Completed By:  Oletta Lamas. Jhonathan Desroches RN   Comments:  This is patients discharge report. He stopped rehab due to the fact that his INS started over in Jan. So he never came back to finish his rehab. His resting  HR was 58 and BP 120/62. And his peak HR was 93 and his peak BP was 162/80.Marland Kitchen He did well while in rehab, A F/U call will be placed to patient at 1 month, 6 month and at 1 year to confirm compliance with exercise.

## 2013-03-10 NOTE — Progress Notes (Signed)
Cardiac Rehabilitation Program Outcomes Report   Orientation:  10/30/2012 Halfway report: 12/31/2012 Graduate Date:  tbd Discharge Date:  tbd # of sessions completed: 52 DX STENT / NSTEMI  Cardiologist: Hilty Family MD:  Morton Amy Time:  09:30  A.  Exercise Program:  Tolerates exercise @ 4.46 miles METS for 4.46 minutes  B.  Mental Health:  Good mental attitude  C.  Education/Instruction/Skills  Accurately checks own pulse.  Rest:  80  Exercise:  117, Knows THR for exercise and Uses Perceived Exertion Scale and/or Dyspnea Scale  Uses Perceived Exertion Scale and/or Dyspnea Scale  D.  Nutrition/Weight Control/Body Composition:  Adherence to prescribed nutrition program: good    E.  Blood Lipids    Lab Results  Component Value Date   CHOL 116 11/05/2012   HDL 39* 11/05/2012   LDLCALC 61 11/05/2012   TRIG 82 11/05/2012   CHOLHDL 3.0 11/05/2012    F.  Lifestyle Changes:  Making positive lifestyle changes and Continues to smoke  G.  Symptoms noted with exercise:  Asymptomatic  Report Completed By:  Oletta Lamas. Tondalaya Perren RN   Comments:  This is patients halfway report. HE achieved a peak METS of 4.46. His resting HR was 80 and his resting BP was 138/90 and HIs peak HR was 117 and peak BP was 136/86. A graduation report will follow upon his 36th session.

## 2013-03-10 NOTE — Addendum Note (Signed)
Encounter addended by: Norlene Duel, RN on: 03/10/2013 12:01 PM<BR>     Documentation filed: Clinical Notes

## 2013-03-10 NOTE — Progress Notes (Signed)
Cardiac Rehabilitation Program Outcomes Report   Orientation:  10/30/2012 1st week report: 11/07/2012 Graduate Date:  tbd Discharge Date:  tbd # of sessions completed: 3 DX: STENT /NSTEMI  Cardiologist: Terrall Laity MD:  Morton Amy Time:  11:00  A.  Exercise Program:  Tolerates exercise @ 3.76 METS for 15 minutes and Walk Test Results:  Pre: Did not perform walk test patient wanted to go back to work  B.  Mental Health:  Good mental attitude  C.  Education/Instruction/Skills  Accurately checks own pulse.  Rest:  67  Exercise:  99, Knows THR for exercise and Uses Perceived Exertion Scale and/or Dyspnea Scale  Uses Perceived Exertion Scale and/or Dyspnea Scale  D.  Nutrition/Weight Control/Body Composition:  Adherence to prescribed nutrition program: good    E.  Blood Lipids    Lab Results  Component Value Date   CHOL 116 11/05/2012   HDL 39* 11/05/2012   LDLCALC 61 11/05/2012   TRIG 82 11/05/2012   CHOLHDL 3.0 11/05/2012    F.  Lifestyle Changes:  Making positive lifestyle changes and Continues to smoke  G.  Symptoms noted with exercise:  Asymptomatic  Report Completed By:  Oletta Lamas. Delson Dulworth RN   Comments:  This is patients first week report. He has done well his first week. He achieved a peak METS of 3.76. His resting HR was 67 and BP resting was 130/80 and his peak HR was 99 and his peak BP was 140/80. A report will follow upon his18th visit his halfway point.

## 2013-03-10 NOTE — Addendum Note (Signed)
Encounter addended by: Norlene Duel, RN on: 03/10/2013 11:46 AM<BR>     Documentation filed: Notes Section

## 2013-03-10 NOTE — Addendum Note (Signed)
Encounter addended by: Norlene Duel, RN on: 03/10/2013 12:00 PM<BR>     Documentation filed: Notes Section

## 2013-03-10 NOTE — Addendum Note (Signed)
Encounter addended by: Norlene Duel, RN on: 03/10/2013 11:46 AM<BR>     Documentation filed: Clinical Notes

## 2013-03-10 NOTE — Addendum Note (Signed)
Encounter addended by: Norlene Duel, RN on: 03/10/2013 11:35 AM<BR>     Documentation filed: Clinical Notes

## 2013-03-11 ENCOUNTER — Encounter (HOSPITAL_COMMUNITY): Payer: 59

## 2013-04-04 ENCOUNTER — Other Ambulatory Visit (HOSPITAL_COMMUNITY): Payer: Self-pay | Admitting: Internal Medicine

## 2013-04-06 NOTE — Telephone Encounter (Signed)
E sentrx 

## 2013-04-13 ENCOUNTER — Encounter: Payer: Self-pay | Admitting: Internal Medicine

## 2013-04-13 ENCOUNTER — Ambulatory Visit (INDEPENDENT_AMBULATORY_CARE_PROVIDER_SITE_OTHER): Payer: 59 | Admitting: Internal Medicine

## 2013-04-13 VITALS — BP 110/86 | HR 55 | Ht 72.0 in | Wt 173.1 lb

## 2013-04-13 DIAGNOSIS — I214 Non-ST elevation (NSTEMI) myocardial infarction: Secondary | ICD-10-CM

## 2013-04-13 DIAGNOSIS — F17200 Nicotine dependence, unspecified, uncomplicated: Secondary | ICD-10-CM

## 2013-04-13 DIAGNOSIS — E785 Hyperlipidemia, unspecified: Secondary | ICD-10-CM

## 2013-04-13 DIAGNOSIS — I2510 Atherosclerotic heart disease of native coronary artery without angina pectoris: Secondary | ICD-10-CM

## 2013-04-13 DIAGNOSIS — R001 Bradycardia, unspecified: Secondary | ICD-10-CM

## 2013-04-13 DIAGNOSIS — N182 Chronic kidney disease, stage 2 (mild): Secondary | ICD-10-CM

## 2013-04-13 DIAGNOSIS — I498 Other specified cardiac arrhythmias: Secondary | ICD-10-CM

## 2013-04-13 MED ORDER — VARENICLINE TARTRATE 0.5 MG X 11 & 1 MG X 42 PO MISC: ORAL | Status: DC

## 2013-04-13 MED ORDER — VARENICLINE TARTRATE 1 MG PO TABS: 1.0000 mg | ORAL_TABLET | Freq: Two times a day (BID) | ORAL | Status: DC

## 2013-04-13 NOTE — Patient Instructions (Addendum)
Your physician wants you to follow-up in: 6 months. You will receive a reminder letter in the mail two months in advance. If you don't receive a letter, please call our office to schedule the follow-up appointment.  Please have fasting blood work done at your earliest convenience.

## 2013-04-13 NOTE — Progress Notes (Signed)
OFFICE NOTE  Chief Complaint:  Routine follow-up, numbness in 2 toes on his right foot  Primary Care Physician: Rosita Fire, MD  HPI:  Robert Shaw is a 63 year old gentleman whom I have been following for a history of coronary disease. He had a stent in his circumflex and diagonal in 2003 and had moderate disease in the LAD and the right coronary. He continues to smoke, which I have talked to him about as a marked risk factor, and he has been working on some exercise. Unfortunately, on January 31 he had an episode when he developed chest pain, somewhat after eating with some shortness of breath, that went to both arms. He took some aspirin at home and the pain did not go away. He, therefore, presented to the emergency department. He was found to have an elevated troponin of 0.72 and underwent cardiac catheterization. That catheterization, performed by me, showed a severe discrete 95% stenosis in the mid LAD. Both the stents in the diagonal and circumflex were patent with minimal in-stent restenosis. The EF was greater than 60% with no focal wall motion abnormalities. The patient then underwent a Xience drug-eluting stent placement to the mid LAD on March 17, 2012, by Dr. Claiborne Billings. This was successful at reducing the lesion to 0% stenosis.  Since his hospitalization he has done very well. He has had no further chest pain. He was switched successfully from Plavix over to Brilinta, which he is taking with low-dose aspirin. His only complaint now is that he is having some numbness and 2 toes on his right foot. He reports he had problems with his right leg after the catheterization saying that he developed a foot drop but that has progressively gotten better over the past year.  He had no hematoma, bruit or catheterization complications that we were aware of at the time. Unfortunately, he continues to smoke, which is an ongoing risk factor.  [name] returns today and is feeling fairly well. He  denies any further chest pain. He reports that his right leg numbness and tingling has improved. He has now been over one year since he had a drug-eluting stent placed in the mid LAD. He continues to be both on aspirin and Brillinta. He's had no adverse bleeding episodes. He is due for recheck of his cholesterol which was supposed to be obtained prior to this visit, but was not.  PMHx:  Past Medical History  Diagnosis Date  . Coronary artery disease   . 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 Family history of heart disease   . Hypertension   . Dyslipidemia   . Tobacco abuse   . NSTEMI (non-ST elevated myocardial infarction)     h/o  . History of nuclear stress test 07/13/2008    dipyridamole; normal perfusion, no ischemia, low risk     Past Surgical History  Procedure Laterality Date  . Cardiac catheterization  04/2001    after high lateral MI, preserved LV function w/mild mid anterolateral hypokinesia, diffuse coronary irregularity with signficiant obstruction in LAD, OM1, superior branch of ramus (Dr. Margo Aye - [facility])  . Coronary angioplasty with stent placement  06/19/2001    Cfx (3.5x12mm Zeta stent) & diagonal stenting (2.5x65mm pixel stent) w/moderate LAD disease (Dr. Corky Downs)   . Cardiac catheterization  09/23/2001    patent stents from 06/2001 (Dr. Jackie Plum)  . Cardiac catheterization  09/17/2002    no restenosis at prior intervention sites (Dr. Corky Downs)  .  Cardiac catheterization  09/09/2009    no significant CAD, patent stents in OM1 & LAD (Dr. Corky Downs)   . Coronary angioplasty with stent placement  03/17/2012    Xience DES (2.25x40mm) of mid-distal LAD (Dr. Corky Downs)  . Transthoracic echocardiogram  03/29/2006    EF normal; mild MR & TR; AV mildly sclerotic    FAMHx:  Family History  Problem Relation Age of Onset  . Heart disease Brother     x3  . Stroke Father     SOCHx:   reports that he has been smoking Cigarettes.  He has a 4 pack-year smoking  history. He has never used smokeless tobacco. He reports that he does not drink alcohol or use illicit drugs.  ALLERGIES:  No Known Allergies  ROS: A comprehensive review of systems was negative except for: Musculoskeletal: positive for right foot numbness  HOME MEDS: Current Outpatient Prescriptions  Medication Sig Dispense Refill  . aspirin EC 81 MG tablet Take 81 mg by mouth daily.      Marland Kitchen BRILINTA 90 MG TABS tablet TAKE 1 TABLET BY MOUTH TWICE DAILY  60 tablet  6  . metoprolol tartrate (LOPRESSOR) 25 MG tablet TAKE ONE-HALF TABLET BY MOUTH TWICE DAILY  30 tablet  6  . nitroGLYCERIN (NITROSTAT) 0.4 MG SL tablet Place 1 tablet (0.4 mg total) under the tongue every 5 (five) minutes as needed for chest pain.  25 tablet  4  . rosuvastatin (CRESTOR) 10 MG tablet Take 1 tablet (10 mg total) by mouth daily.  30 tablet  6  . varenicline (CHANTIX CONTINUING MONTH PAK) 1 MG tablet Take 1 tablet (1 mg total) by mouth 2 (two) times daily.  60 tablet  1  . varenicline (CHANTIX STARTING MONTH PAK) 0.5 MG X 11 & 1 MG X 42 tablet Take one 0.5 mg tablet by mouth once daily for 3 days, then increase to one 0.5 mg tablet twice daily for 4 days, then increase to one 1 mg tablet twice daily.  53 tablet  0   No current facility-administered medications for this visit.    LABS/IMAGING: No results found for this or any previous visit (from the past 48 hour(s)). No results found.  VITALS: BP 110/86  Pulse 55  Ht 6' (1.829 m)  Wt 173 lb 1.6 oz (78.518 kg)  BMI 23.47 kg/m2  EXAM: General appearance: alert and no distress Neck: no carotid bruit and no JVD Lungs: clear to auscultation bilaterally Heart: regular rate and rhythm, S1, S2 normal, no murmur, click, rub or gallop Abdomen: soft, non-tender; bowel sounds normal; no masses,  no organomegaly Extremities: extremities normal, atraumatic, no cyanosis or edema Pulses: 2+ and symmetric Skin: Skin color, texture, turgor normal. No rashes or  lesions Neurologic: Grossly normal Psych: Mood, affect normal  EKG: Sinus bradycardia at 55 with lateral T wave inversions  ASSESSMENT: 1. Coronary artery disease status post PCI to the mid to distal LAD with a Xience DES (03/2012) 2. Dyslipidemia 3. Hypertension 4. Tobacco dependence 5. Right foot peripheral neuropathy-improving  PLAN: 1.   Mr. Galea continues to do well without any recurrent angina. Given the fact he had a non-STEMI and in light of the most recent data regarding longer term antiplatelet therapy, I would recommend that he stays on aspirin and Brillinta for up to 3 years as the data supports. If the cost is too excessive, we could consider aspirin and Plavix. I will plan to have him go recheck his cholesterol again  and contact him with the data on that. Finally, he is interested in smoking cessation. He'll talk to his primary care provider about Chantix, but was dissuaded due to side effects. I have had good success with the medicine and of course there could be side effects, most notably intrusive dreams. I did counsel him about the side effects today and he is interested in trying a medication. I will provide him with a starter pack and followup prescriptions and plan to see him in 6 months.  Pixie Casino, MD, [facility] Attending Cardiologist [facility]  Quintana Canelo C 04/13/2013, 1:16 PM

## 2013-06-04 NOTE — Telephone Encounter (Signed)
Closed encounter °

## 2013-08-05 ENCOUNTER — Other Ambulatory Visit: Payer: Self-pay | Admitting: Internal Medicine

## 2013-08-06 NOTE — Telephone Encounter (Signed)
Rx refill sent to patient pharmacy   

## 2013-10-27 ENCOUNTER — Ambulatory Visit (INDEPENDENT_AMBULATORY_CARE_PROVIDER_SITE_OTHER): Payer: 59 | Admitting: Internal Medicine

## 2013-10-27 ENCOUNTER — Encounter: Payer: Self-pay | Admitting: Internal Medicine

## 2013-10-27 ENCOUNTER — Other Ambulatory Visit (HOSPITAL_COMMUNITY): Payer: Self-pay | Admitting: Cardiology

## 2013-10-27 VITALS — BP 120/78 | HR 57 | Ht 72.0 in | Wt 167.6 lb

## 2013-10-27 DIAGNOSIS — N182 Chronic kidney disease, stage 2 (mild): Secondary | ICD-10-CM

## 2013-10-27 DIAGNOSIS — I498 Other specified cardiac arrhythmias: Secondary | ICD-10-CM

## 2013-10-27 DIAGNOSIS — R001 Bradycardia, unspecified: Secondary | ICD-10-CM

## 2013-10-27 DIAGNOSIS — E785 Hyperlipidemia, unspecified: Secondary | ICD-10-CM

## 2013-10-27 DIAGNOSIS — F17200 Nicotine dependence, unspecified, uncomplicated: Secondary | ICD-10-CM

## 2013-10-27 DIAGNOSIS — I2510 Atherosclerotic heart disease of native coronary artery without angina pectoris: Secondary | ICD-10-CM

## 2013-10-27 MED ORDER — ROSUVASTATIN CALCIUM 10 MG PO TABS: ORAL_TABLET | ORAL | Status: DC

## 2013-10-27 MED ORDER — TICAGRELOR 90 MG PO TABS: ORAL_TABLET | ORAL | Status: DC

## 2013-10-27 NOTE — Patient Instructions (Signed)
Your physician wants you to follow-up in:  6 months. You will receive a reminder letter in the mail two months in advance. If you don't receive a letter, please call our office to schedule the follow-up appointment.   

## 2013-10-27 NOTE — Telephone Encounter (Signed)
Rx was sent to pharmacy electronically. 

## 2013-10-27 NOTE — Progress Notes (Signed)
OFFICE NOTE  Chief Complaint:  Routine follow-up, numbness in 2 toes on his right foot  Primary Care Physician: Rosita Fire, MD  HPI:  Robert Shaw is a 63 year old gentleman whom I have been following for a history of coronary disease. He had a stent in his circumflex and diagonal in 2003 and had moderate disease in the LAD and the right coronary. He continues to smoke, which I have talked to him about as a marked risk factor, and he has been working on some exercise. Unfortunately, on January 31 he had an episode when he developed chest pain, somewhat after eating with some shortness of breath, that went to both arms. He took some aspirin at home and the pain did not go away. He, therefore, presented to the emergency department. He was found to have an elevated troponin of 0.72 and underwent cardiac catheterization. That catheterization, performed by me, showed a severe discrete 95% stenosis in the mid LAD. Both the stents in the diagonal and circumflex were patent with minimal in-stent restenosis. The EF was greater than 60% with no focal wall motion abnormalities. The patient then underwent a Xience drug-eluting stent placement to the mid LAD on March 17, 2012, by Dr. Claiborne Billings. This was successful at reducing the lesion to 0% stenosis.  Since his hospitalization he has done very well. He has had no further chest pain. He was switched successfully from Plavix over to Brilinta, which he is taking with low-dose aspirin. His only complaint now is that he is having some numbness and 2 toes on his right foot. He reports he had problems with his right leg after the catheterization saying that he developed a foot drop but that has progressively gotten better over the past year.  He had no hematoma, bruit or catheterization complications that we were aware of at the time. Unfortunately, he continues to smoke, which is an ongoing risk factor.  Robert Shaw returns today for followup. He is doing well  denies any chest pain or worsening shortness of breath. He continues on aspirin and Brillinta. During his last office visit we had recommended starting Chantix for smoking cessation however he has not started that medication. He continues to smoke several cigarettes a day. He has no good reason for why he did not start the medication. He is also anticipating undergoing a switched to Medicare this year. Recent laboratory work was performed by his primary care provider which showed an impaired fasting glucose at 101 and hemoglobin A1c of 6.1 suggesting he may be prediabetic. His creatinine is 1.4. Lipid profile demonstrates total cholesterol 107, HDL 37, triglycerides 95 and LDL 51.  PMHx:  Past Medical History  Diagnosis Date  . Coronary artery disease   . 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 Family history of heart disease   . Hypertension   . Dyslipidemia   . Tobacco abuse   . NSTEMI (non-ST elevated myocardial infarction)     h/o  . History of nuclear stress test 07/13/2008    dipyridamole; normal perfusion, no ischemia, low risk     Past Surgical History  Procedure Laterality Date  . Cardiac catheterization  04/2001    after high lateral MI, preserved LV function w/mild mid anterolateral hypokinesia, diffuse coronary irregularity with signficiant obstruction in LAD, OM1, superior branch of ramus (Dr. Margo Aye - [facility])  . Coronary angioplasty with stent placement  06/19/2001    Cfx (3.5x43mm Zeta stent) & diagonal stenting (2.5x36mm pixel stent) w/moderate  LAD disease (Dr. Corky Downs)   . Cardiac catheterization  09/23/2001    patent stents from 06/2001 (Dr. Jackie Plum)  . Cardiac catheterization  09/17/2002    no restenosis at prior intervention sites (Dr. Corky Downs)  . Cardiac catheterization  09/09/2009    no significant CAD, patent stents in OM1 & LAD (Dr. Corky Downs)   . Coronary angioplasty with stent placement  03/17/2012    Xience DES (2.25x51mm) of mid-distal LAD (Dr.  Corky Downs)  . Transthoracic echocardiogram  03/29/2006    EF normal; mild MR & TR; AV mildly sclerotic    FAMHx:  Family History  Problem Relation Age of Onset  . Heart disease Brother     x3  . Stroke Father     SOCHx:   reports that he has been smoking Cigarettes.  He has a 4 pack-year smoking history. He has never used smokeless tobacco. He reports that he does not drink alcohol or use illicit drugs.  ALLERGIES:  No Known Allergies  ROS: A comprehensive review of systems was negative.  HOME MEDS: Current Outpatient Prescriptions  Medication Sig Dispense Refill  . aspirin EC 81 MG tablet Take 81 mg by mouth daily.      . metoprolol tartrate (LOPRESSOR) 25 MG tablet TAKE ONE-HALF TABLET BY MOUTH TWICE DAILY  30 tablet  6  . nitroGLYCERIN (NITROSTAT) 0.4 MG SL tablet Place 1 tablet (0.4 mg total) under the tongue every 5 (five) minutes as needed for chest pain.  25 tablet  4  . rosuvastatin (CRESTOR) 10 MG tablet TAKE 1 TABLET BY MOUTH EVERY DAY  28 tablet  0  . ticagrelor (BRILINTA) 90 MG TABS tablet TAKE 1 TABLET BY MOUTH TWICE DAILY  32 tablet  0  . varenicline (CHANTIX CONTINUING MONTH PAK) 1 MG tablet Take 1 tablet (1 mg total) by mouth 2 (two) times daily.  60 tablet  1   No current facility-administered medications for this visit.    LABS/IMAGING: No results found for this or any previous visit (from the past 48 hour(s)). No results found.  VITALS: BP 120/78  Pulse 57  Ht 6' (1.829 m)  Wt 167 lb 9.6 oz (76.023 kg)  BMI 22.73 kg/m2  EXAM: General appearance: alert and no distress Neck: no carotid bruit and no JVD Lungs: clear to auscultation bilaterally Heart: regular rate and rhythm, S1, S2 normal, no murmur, click, rub or gallop Abdomen: soft, non-tender; bowel sounds normal; no masses,  no organomegaly Extremities: extremities normal, atraumatic, no cyanosis or edema Pulses: 2+ and symmetric Skin: Skin color, texture, turgor normal. No rashes or  lesions Neurologic: Grossly normal Psych: Mood, affect normal  EKG: Sinus bradycardia at 57, lateral TWI's unchanged  ASSESSMENT: 1. Coronary artery disease status post PCI to the mid to distal LAD with a Xience DES (03/2012) 2. Dyslipidemia - at goal 3. Hypertension - controlled 4. Tobacco dependence - has Chantix, not ready to quit 5. Right foot peripheral neuropathy-improving  PLAN: 1.   Robert Shaw continues to do well. His blood pressures controlled. His cholesterol is at goal. He denies any angina. He is not ready to quit smoking but needs to and has Chantix available. His neuropathy is tolerable. He is prediabetic based on his laboratory work I advised him to work on sugar intake increased exercise and diet changes. Plan to see him back in 6 months or sooner as necessary.  Pixie Casino, MD, [facility] Attending Cardiologist [facility]  Robert Shaw,Robert Shaw 10/27/2013,  9:56 AM

## 2014-01-21 ENCOUNTER — Encounter (HOSPITAL_COMMUNITY): Payer: Self-pay | Admitting: Internal Medicine

## 2014-02-11 ENCOUNTER — Other Ambulatory Visit: Payer: Self-pay | Admitting: Internal Medicine

## 2014-02-11 NOTE — Telephone Encounter (Signed)
E-sent RX #[id number]

## 2014-05-17 ENCOUNTER — Other Ambulatory Visit: Payer: Self-pay | Admitting: Internal Medicine

## 2014-05-17 NOTE — Telephone Encounter (Signed)
Rx(s) sent to pharmacy electronically.  

## 2014-07-09 ENCOUNTER — Encounter: Payer: Self-pay | Admitting: Internal Medicine

## 2014-07-13 ENCOUNTER — Other Ambulatory Visit: Payer: Self-pay | Admitting: *Deleted

## 2014-07-13 MED ORDER — ROSUVASTATIN CALCIUM 10 MG PO TABS: ORAL_TABLET | ORAL | Status: DC

## 2014-07-13 MED ORDER — NITROGLYCERIN 0.4 MG SL SUBL: 0.4000 mg | SUBLINGUAL_TABLET | SUBLINGUAL | Status: DC | PRN

## 2014-07-13 MED ORDER — METOPROLOL TARTRATE 25 MG PO TABS: 12.5000 mg | ORAL_TABLET | Freq: Two times a day (BID) | ORAL | Status: DC

## 2014-07-13 MED ORDER — TICAGRELOR 90 MG PO TABS: 90.0000 mg | ORAL_TABLET | Freq: Two times a day (BID) | ORAL | Status: DC

## 2014-07-13 NOTE — Telephone Encounter (Signed)
Patient walked in requesting medication refills to [facility] - change per insurance. Patient prefers #[phone] Rx(s) sent to pharmacy electronically.

## 2014-07-16 ENCOUNTER — Telehealth: Payer: Self-pay | Admitting: Internal Medicine

## 2014-07-16 NOTE — Telephone Encounter (Signed)
07/13/2014 Patient came in office today to sign release to get his last office visit note, I copied and note given to patient. cbr

## 2014-07-19 ENCOUNTER — Other Ambulatory Visit: Payer: Self-pay | Admitting: *Deleted

## 2014-07-19 MED ORDER — ROSUVASTATIN CALCIUM 10 MG PO TABS: ORAL_TABLET | ORAL | Status: DC

## 2014-08-23 ENCOUNTER — Other Ambulatory Visit: Payer: Self-pay | Admitting: *Deleted

## 2014-08-25 ENCOUNTER — Ambulatory Visit: Payer: Self-pay | Admitting: Internal Medicine

## 2014-09-07 ENCOUNTER — Ambulatory Visit (INDEPENDENT_AMBULATORY_CARE_PROVIDER_SITE_OTHER): Payer: BLUE CROSS/BLUE SHIELD | Admitting: Internal Medicine

## 2014-09-07 ENCOUNTER — Encounter: Payer: Self-pay | Admitting: Internal Medicine

## 2014-09-07 VITALS — BP 130/80 | HR 60 | Ht 71.0 in | Wt 167.0 lb

## 2014-09-07 DIAGNOSIS — I2583 Coronary atherosclerosis due to lipid rich plaque: Secondary | ICD-10-CM

## 2014-09-07 DIAGNOSIS — I2510 Atherosclerotic heart disease of native coronary artery without angina pectoris: Secondary | ICD-10-CM

## 2014-09-07 DIAGNOSIS — E785 Hyperlipidemia, unspecified: Secondary | ICD-10-CM | POA: Diagnosis not present

## 2014-09-07 DIAGNOSIS — Z79899 Other long term (current) drug therapy: Secondary | ICD-10-CM

## 2014-09-07 DIAGNOSIS — I214 Non-ST elevation (NSTEMI) myocardial infarction: Secondary | ICD-10-CM

## 2014-09-07 DIAGNOSIS — N182 Chronic kidney disease, stage 2 (mild): Secondary | ICD-10-CM | POA: Diagnosis not present

## 2014-09-07 DIAGNOSIS — F17200 Nicotine dependence, unspecified, uncomplicated: Secondary | ICD-10-CM

## 2014-09-07 NOTE — Patient Instructions (Signed)
Medication Instructions:   STOP Brilinta  Labwork:  Please have fasting lab work - there is a Research scientist (life sciences) at [facility]  Testing/Procedures:  NONE  Follow-Up:  1 year with Dr. Debara Pickett  Any Other Special Instructions Will Be Listed Below (If Applicable).

## 2014-09-07 NOTE — Progress Notes (Signed)
OFFICE NOTE  Chief Complaint:   no complaints  Primary Care Physician: Rosita Fire, MD  HPI:  Robert Shaw is a 64 year old gentleman whom I have been following for a history of coronary disease. He had a stent in his circumflex and diagonal in 2003 and had moderate disease in the LAD and the right coronary. He continues to smoke, which I have talked to him about as a marked risk factor, and he has been working on some exercise. Unfortunately, on January 31 he had an episode when he developed chest pain, somewhat after eating with some shortness of breath, that went to both arms. He took some aspirin at home and the pain did not go away. He, therefore, presented to the emergency department. He was found to have an elevated troponin of 0.72 and underwent cardiac catheterization. That catheterization, performed by me, showed a severe discrete 95% stenosis in the mid LAD. Both the stents in the diagonal and circumflex were patent with minimal in-stent restenosis. The EF was greater than 60% with no focal wall motion abnormalities. The patient then underwent a Xience drug-eluting stent placement to the mid LAD on March 17, 2012, by Dr. Claiborne Billings. This was successful at reducing the lesion to 0% stenosis.  Since his hospitalization he has done very well. He has had no further chest pain. He was switched successfully from Plavix over to Brilinta, which he is taking with low-dose aspirin. His only complaint now is that he is having some numbness and 2 toes on his right foot. He reports he had problems with his right leg after the catheterization saying that he developed a foot drop but that has progressively gotten better over the past year.  He had no hematoma, bruit or catheterization complications that we were aware of at the time. Unfortunately, he continues to smoke, which is an ongoing risk factor.  Robert Shaw returns today for followup. He is doing well denies any chest pain or worsening  shortness of breath. He continues on aspirin and Brillinta. During his last office visit we had recommended starting Chantix for smoking cessation however he has not started that medication. He continues to smoke several cigarettes a day. He has no good reason for why he did not start the medication. He is also anticipating undergoing a switched to Medicare this year. Recent laboratory work was performed by his primary care provider which showed an impaired fasting glucose at 101 and hemoglobin A1c of 6.1 suggesting he may be prediabetic. His creatinine is 1.4. Lipid profile demonstrates total cholesterol 107, HDL 37, triglycerides 95 and LDL 51.  I saw Robert Shaw back in the office today. He is currently without any complaints. He denies any chest pain or worsening shortness of breath. Unfortunately has not been able to stop smoking but he is down to about 2 cigarettes a day. As mentioned above his last PCI was in February 2014. He remains on dual antiplatelets therapy with Brilinta, this was for non-STEMI. The data with long-term therapy of Brilinta does not go out much past 2 years therefore we may consider having him come off of this medication.  PMHx:  Past Medical History  Diagnosis Date  . Coronary artery disease   . 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 Family history of heart disease   . Hypertension   . Dyslipidemia   . Tobacco abuse   . NSTEMI (non-ST elevated myocardial infarction)     h/o  . History of nuclear stress test 07/13/2008  dipyridamole; normal perfusion, no ischemia, low risk     Past Surgical History  Procedure Laterality Date  . Cardiac catheterization  04/2001    after high lateral MI, preserved LV function w/mild mid anterolateral hypokinesia, diffuse coronary irregularity with signficiant obstruction in LAD, OM1, superior branch of ramus (Dr. Margo Aye - [facility])  . Coronary angioplasty with stent placement  06/19/2001    Cfx (3.5x69mm Zeta  stent) & diagonal stenting (2.5x77mm pixel stent) w/moderate LAD disease (Dr. Corky Downs)   . Cardiac catheterization  09/23/2001    patent stents from 06/2001 (Dr. Jackie Plum)  . Cardiac catheterization  09/17/2002    no restenosis at prior intervention sites (Dr. Corky Downs)  . Cardiac catheterization  09/09/2009    no significant CAD, patent stents in OM1 & LAD (Dr. Corky Downs)   . Coronary angioplasty with stent placement  03/17/2012    Xience DES (2.25x23mm) of mid-distal LAD (Dr. Corky Downs)  . Transthoracic echocardiogram  03/29/2006    EF normal; mild MR & TR; AV mildly sclerotic  . Left heart catheterization with coronary angiogram N/A 03/17/2012    Procedure: LEFT HEART CATHETERIZATION WITH CORONARY ANGIOGRAM;  Surgeon: Pixie Casino, MD;  Location: [facility] CATH LAB;  Service: Cardiovascular;  Laterality: N/A;  . Percutaneous coronary stent intervention (pci-s)  03/17/2012    Procedure: PERCUTANEOUS CORONARY STENT INTERVENTION (PCI-S);  Surgeon: Pixie Casino, MD;  Location: [facility] CATH LAB;  Service: Cardiovascular;;    FAMHx:  Family History  Problem Relation Age of Onset  . Heart disease Brother     x3  . Stroke Father     SOCHx:   reports that he has been smoking Cigarettes.  He has a 4 pack-year smoking history. He has never used smokeless tobacco. He reports that he does not drink alcohol or use illicit drugs.  ALLERGIES:  No Known Allergies  ROS: A comprehensive review of systems was negative.  HOME MEDS: Current Outpatient Prescriptions  Medication Sig Dispense Refill  . aspirin EC 81 MG tablet Take 81 mg by mouth daily.    . metoprolol tartrate (LOPRESSOR) 25 MG tablet Take 0.5 tablets (12.5 mg total) by mouth 2 (two) times daily. 90 tablet 1  . nitroGLYCERIN (NITROSTAT) 0.4 MG SL tablet Place 1 tablet (0.4 mg total) under the tongue every 5 (five) minutes as needed for chest pain. 25 tablet 2  . rosuvastatin (CRESTOR) 10 MG tablet TAKE 1 TABLET BY MOUTH EVERY DAY 90 tablet 1   No  current facility-administered medications for this visit.    LABS/IMAGING: No results found for this or any previous visit (from the past 48 hour(s)). No results found.  VITALS: BP 130/80 mmHg  Pulse 60  Ht 5\' 11"  (1.803 m)  Wt 167 lb (75.751 kg)  BMI 23.30 kg/m2  EXAM: General appearance: alert and no distress Neck: no carotid bruit and no JVD Lungs: clear to auscultation bilaterally Heart: regular rate and rhythm, S1, S2 normal, no murmur, click, rub or gallop Abdomen: soft, non-tender; bowel sounds normal; no masses,  no organomegaly Extremities: extremities normal, atraumatic, no cyanosis or edema Pulses: 2+ and symmetric Skin: Skin color, texture, turgor normal. No rashes or lesions Neurologic: Grossly normal Psych: Mood, affect normal  EKG:  normal sinus rhythm at 60, anterolateral T-wave changes -stable  ASSESSMENT: 1. Coronary artery disease status post PCI to the mid to distal LAD with a Xience DES (03/2012) 2. Dyslipidemia - at goal 3. Hypertension -  controlled 4. Tobacco dependence - has Chantix, not ready to quit  PLAN: 1.   Mr. Rotert continues to do well. His blood pressures are controlled. His cholesterol is at goal. He denies any angina. He is not ready to quit smoking but needs to and has Chantix available.  I think at this point we can discontinue Brilinta as it's been 2 years since his non-STEMI and drug-eluting stent placement. He should continue on low-dose aspirin. Overall he is doing well. I would like to recheck a cholesterol profile and plan to see him back annually or sooner as necessary.  Pixie Casino, MD, [facility] Attending Cardiologist [place] [facility] Caidyn Blossom 09/07/2014, 9:26 AM

## 2014-09-15 LAB — COMPREHENSIVE METABOLIC PANEL
ALT: 15 U/L (ref 9–46)
AST: 20 U/L (ref 10–35)
Albumin: 3.9 g/dL (ref 3.6–5.1)
Alkaline Phosphatase: 74 U/L (ref 40–115)
BUN: 15 mg/dL (ref 7–25)
CO2: 25 mmol/L (ref 20–31)
Calcium: 8.8 mg/dL (ref 8.6–10.3)
Chloride: 107 mmol/L (ref 98–110)
Creat: 1.42 mg/dL — ABNORMAL HIGH (ref 0.70–1.25)
Glucose, Bld: 92 mg/dL (ref 65–99)
Potassium: 4.6 mmol/L (ref 3.5–5.3)
SODIUM: 138 mmol/L (ref 135–146)
Total Bilirubin: 0.8 mg/dL (ref 0.2–1.2)
Total Protein: 6.2 g/dL (ref 6.1–8.1)

## 2014-09-15 LAB — LIPID PANEL
CHOL/HDL RATIO: 3.4 ratio (ref <=5.0)
Cholesterol: 112 mg/dL — ABNORMAL LOW (ref 125–200)
HDL: 33 mg/dL — AB (ref >=40)
LDL CALC: 49 mg/dL (ref <130)
Triglycerides: 150 mg/dL — ABNORMAL HIGH (ref <150)
VLDL: 30 mg/dL (ref <30)

## 2014-09-27 ENCOUNTER — Other Ambulatory Visit: Payer: Self-pay | Admitting: *Deleted

## 2014-09-27 MED ORDER — ROSUVASTATIN CALCIUM 10 MG PO TABS: ORAL_TABLET | ORAL | Status: DC

## 2014-12-16 ENCOUNTER — Emergency Department (HOSPITAL_COMMUNITY): Payer: BLUE CROSS/BLUE SHIELD

## 2014-12-16 ENCOUNTER — Emergency Department (HOSPITAL_COMMUNITY)
Admission: EM | Admit: 2014-12-16 | Discharge: 2014-12-16 | Disposition: A | Discharge status: Home / Self Care | Payer: BLUE CROSS/BLUE SHIELD | Attending: Emergency Medicine | Admitting: Emergency Medicine

## 2014-12-16 ENCOUNTER — Encounter (HOSPITAL_COMMUNITY): Payer: Self-pay

## 2014-12-16 DIAGNOSIS — Z720 Tobacco use: Secondary | ICD-10-CM | POA: Insufficient documentation

## 2014-12-16 DIAGNOSIS — Y9389 Activity, other specified: Secondary | ICD-10-CM | POA: Diagnosis not present

## 2014-12-16 DIAGNOSIS — I10 Essential (primary) hypertension: Secondary | ICD-10-CM | POA: Diagnosis not present

## 2014-12-16 DIAGNOSIS — Z79899 Other long term (current) drug therapy: Secondary | ICD-10-CM | POA: Insufficient documentation

## 2014-12-16 DIAGNOSIS — S161XXA Strain of muscle, fascia and tendon at neck level, initial encounter: Secondary | ICD-10-CM | POA: Insufficient documentation

## 2014-12-16 DIAGNOSIS — I2510 Atherosclerotic heart disease of native coronary artery without angina pectoris: Secondary | ICD-10-CM | POA: Diagnosis not present

## 2014-12-16 DIAGNOSIS — S29012A Strain of muscle and tendon of back wall of thorax, initial encounter: Secondary | ICD-10-CM | POA: Diagnosis not present

## 2014-12-16 DIAGNOSIS — E785 Hyperlipidemia, unspecified: Secondary | ICD-10-CM | POA: Insufficient documentation

## 2014-12-16 DIAGNOSIS — I252 Old myocardial infarction: Secondary | ICD-10-CM | POA: Diagnosis not present

## 2014-12-16 DIAGNOSIS — Z9889 Other specified postprocedural states: Secondary | ICD-10-CM | POA: Insufficient documentation

## 2014-12-16 DIAGNOSIS — S29019A Strain of muscle and tendon of unspecified wall of thorax, initial encounter: Secondary | ICD-10-CM

## 2014-12-16 DIAGNOSIS — Z9861 Coronary angioplasty status: Secondary | ICD-10-CM | POA: Diagnosis not present

## 2014-12-16 DIAGNOSIS — S0990XA Unspecified injury of head, initial encounter: Secondary | ICD-10-CM | POA: Insufficient documentation

## 2014-12-16 DIAGNOSIS — Y92410 Unspecified street and highway as the place of occurrence of the external cause: Secondary | ICD-10-CM | POA: Insufficient documentation

## 2014-12-16 DIAGNOSIS — Z7982 Long term (current) use of aspirin: Secondary | ICD-10-CM | POA: Insufficient documentation

## 2014-12-16 DIAGNOSIS — S199XXA Unspecified injury of neck, initial encounter: Secondary | ICD-10-CM | POA: Diagnosis present

## 2014-12-16 DIAGNOSIS — Y998 Other external cause status: Secondary | ICD-10-CM | POA: Insufficient documentation

## 2014-12-16 MED ORDER — METHOCARBAMOL 500 MG PO TABS: 500.0000 mg | ORAL_TABLET | Freq: Two times a day (BID) | ORAL | Status: DC

## 2014-12-16 MED ORDER — NAPROXEN 500 MG PO TABS: 500.0000 mg | ORAL_TABLET | Freq: Two times a day (BID) | ORAL | Status: DC

## 2014-12-16 MED ORDER — IBUPROFEN 800 MG PO TABS
800.0000 mg | ORAL_TABLET | Freq: Once | ORAL | Status: AC
  Administered 2014-12-16: 800 mg via ORAL
  Filled 2014-12-16: qty 1

## 2014-12-16 NOTE — ED Notes (Signed)
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

## 2014-12-16 NOTE — Discharge Instructions (Signed)
Cervical Sprain °A cervical sprain is an injury in the neck in which the strong, fibrous tissues (ligaments) that connect your neck bones stretch or tear. Cervical sprains can range from mild to severe. Severe cervical sprains can cause the neck vertebrae to be unstable. This can lead to damage of the spinal cord and can result in serious nervous system problems. The amount of time it takes for a cervical sprain to get better depends on the cause and extent of the injury. Most cervical sprains heal in 1 to 3 weeks. °CAUSES  °Severe cervical sprains may be caused by:  °· Contact sport injuries (such as from football, rugby, wrestling, hockey, auto racing, gymnastics, diving, martial arts, or boxing).   °· Motor vehicle collisions.   °· Whiplash injuries. This is an injury from a sudden forward and backward whipping movement of the head and neck.  °· Falls.   °Mild cervical sprains may be caused by:  °· Being in an awkward position, such as while cradling a telephone between your ear and shoulder.   °· Sitting in a chair that does not offer proper support.   °· Working at a poorly designed computer station.   °· Looking up or down for long periods of time.   °SYMPTOMS  °· Pain, soreness, stiffness, or a burning sensation in the front, back, or sides of the neck. This discomfort may develop immediately after the injury or slowly, 24 hours or more after the injury.   °· Pain or tenderness directly in the middle of the back of the neck.   °· Shoulder or upper back pain.   °· Limited ability to move the neck.   °· Headache.   °· Dizziness.   °· Weakness, numbness, or tingling in the hands or arms.   °· Muscle spasms.   °· Difficulty swallowing or chewing.   °· Tenderness and swelling of the neck.   °DIAGNOSIS  °Most of the time your health care provider can diagnose a cervical sprain by taking your history and doing a physical exam. Your health care provider will ask about previous neck injuries and any known neck  problems, such as arthritis in the neck. X-rays may be taken to find out if there are any other problems, such as with the bones of the neck. Other tests, such as a CT scan or MRI, may also be needed.  °TREATMENT  °Treatment depends on the severity of the cervical sprain. Mild sprains can be treated with rest, keeping the neck in place (immobilization), and pain medicines. Severe cervical sprains are immediately immobilized. Further treatment is done to help with pain, muscle spasms, and other symptoms and may include: °· Medicines, such as pain relievers, numbing medicines, or muscle relaxants.   °· Physical therapy. This may involve stretching exercises, strengthening exercises, and posture training. Exercises and improved posture can help stabilize the neck, strengthen muscles, and help stop symptoms from returning.   °HOME CARE INSTRUCTIONS  °· Put ice on the injured area.   °¨ Put ice in a plastic bag.   °¨ Place a towel between your skin and the bag.   °¨ Leave the ice on for 15-20 minutes, 3-4 times a day.   °· If your injury was severe, you may have been given a cervical collar to wear. A cervical collar is a two-piece collar designed to keep your neck from moving while it heals. °¨ Do not remove the collar unless instructed by your health care provider. °¨ If you have long hair, keep it outside of the collar. °¨ Ask your health care provider before making any adjustments to your collar. Minor   adjustments may be required over time to improve comfort and reduce pressure on your chin or on the back of your head.  Ifyou are allowed to remove the collar for cleaning or bathing, follow your health care provider's instructions on how to do so safely.  Keep your collar clean by wiping it with mild soap and water and drying it completely. If the collar you have been given includes removable pads, remove them every 1-2 days and hand wash them with soap and water. Allow them to air dry. They should be completely  dry before you wear them in the collar.  If you are allowed to remove the collar for cleaning and bathing, wash and dry the skin of your neck. Check your skin for irritation or sores. If you see any, tell your health care provider.  Do not drive while wearing the collar.   Only take over-the-counter or prescription medicines for pain, discomfort, or fever as directed by your health care provider.   Keep all follow-up appointments as directed by your health care provider.   Keep all physical therapy appointments as directed by your health care provider.   Make any needed adjustments to your workstation to promote good posture.   Avoid positions and activities that make your symptoms worse.   Warm up and stretch before being active to help prevent problems.  SEEK MEDICAL CARE IF:   Your pain is not controlled with medicine.   You are unable to decrease your pain medicine over time as planned.   Your activity level is not improving as expected.  SEEK IMMEDIATE MEDICAL CARE IF:   You develop any bleeding.  You develop stomach upset.  You have signs of an allergic reaction to your medicine.   Your symptoms get worse.   You develop new, unexplained symptoms.   You have numbness, tingling, weakness, or paralysis in any part of your body.  MAKE SURE YOU:   Understand these instructions.  Will watch your condition.  Will get help right away if you are not doing well or get worse.   This information is not intended to replace advice given to you by your health care provider. Make sure you discuss any questions you have with your health care provider.   Document Released: 11/26/2006 Document Revised: 02/03/2013 Document Reviewed: 08/06/2012 Elsevier Interactive Patient Education 2016 [employer].  Thoracic Strain A thoracic strain, which is sometimes called a mid-back strain, is an injury to the muscles or tendons that attach to the upper part of your back  behind your chest. This type of injury occurs when a muscle is overstretched or overloaded.  Thoracic strains can range from mild to severe. Mild strains may involve stretching a muscle or tendon without tearing it. These injuries may heal in 1-2 weeks. More severe strains involve tearing of muscle fibers or tendons. These will cause more pain and may take 6-8 weeks to heal. CAUSES This condition may be caused by:  An injury in which a sudden force is placed on the muscle.  Exercising without properly warming up.  Overuse of the muscle.  Improper form during certain movements.  Other injuries that surround or cause stress on the mid-back, causing a strain on the muscles. In some cases, the cause may not be known. RISK FACTORS This injury is more common in:  Athletes.  People with obesity. SYMPTOMS The main symptom of this condition is pain, especially with movement. Other symptoms include:  Bruising.  Swelling.  Spasm. DIAGNOSIS  This condition may be diagnosed with a physical exam. X-rays may be taken to check for a fracture. TREATMENT This condition may be treated with:  Resting and icing the injured area.  Physical therapy. This will involve doing stretching and strengthening exercises.  Medicines for pain and inflammation. HOME CARE INSTRUCTIONS  Rest as needed. Follow instructions from your health care provider about any restrictions on activity.  If directed, apply ice to the injured area:  Put ice in a plastic bag.  Place a towel between your skin and the bag.  Leave the ice on for 20 minutes, 2-3 times per day.  Take over-the-counter and prescription medicines only as told by your health care provider.  Begin doing exercises as told by your health care provider or physical therapist.  Always warm up properly before physical activity or sports.  Bend your knees before you lift heavy objects.  Keep all follow-up visits as told by your health care  provider. This is important. SEEK MEDICAL CARE IF:  Your pain is not helped by medicine.  Your pain, bruising, or swelling is getting worse.  You have a fever. SEEK IMMEDIATE MEDICAL CARE IF:  You have shortness of breath.  You have chest pain.  You develop numbness or weakness in your legs.  You have involuntary loss of urine (urinary incontinence).   This information is not intended to replace advice given to you by your health care provider. Make sure you discuss any questions you have with your health care provider.   Document Released: 04/21/2003 Document Revised: 10/20/2014 Document Reviewed: 03/25/2014 Elsevier Interactive Patient Education [employer].
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
## 2014-12-28 ENCOUNTER — Ambulatory Visit (HOSPITAL_COMMUNITY): Payer: BLUE CROSS/BLUE SHIELD | Attending: Internal Medicine | Admitting: Physical Therapy

## 2014-12-28 DIAGNOSIS — R29898 Other symptoms and signs involving the musculoskeletal system: Secondary | ICD-10-CM | POA: Diagnosis present

## 2014-12-28 DIAGNOSIS — M546 Pain in thoracic spine: Secondary | ICD-10-CM | POA: Diagnosis present

## 2014-12-28 DIAGNOSIS — R519 Headache, unspecified: Secondary | ICD-10-CM

## 2014-12-28 DIAGNOSIS — M542 Cervicalgia: Secondary | ICD-10-CM | POA: Diagnosis not present

## 2014-12-28 DIAGNOSIS — R51 Headache: Secondary | ICD-10-CM | POA: Diagnosis present

## 2014-12-28 DIAGNOSIS — M25512 Pain in left shoulder: Secondary | ICD-10-CM | POA: Insufficient documentation

## 2014-12-28 DIAGNOSIS — R6889 Other general symptoms and signs: Secondary | ICD-10-CM | POA: Diagnosis present

## 2014-12-28 NOTE — Patient Instructions (Signed)
Upper Cervical Flexion: Resisted    Lay on your back with a pillow behind your head. With fingers supporting chin, and head in relaxed posture, gently nod head while applying resistance with fingers. Do not bend head forward to complete the motion.  Hold 3 seconds.  Repeat __10__ times per set. Do __1__ sets per session. Do __2__ sessions per day.  http://orth.exer.us/382   Copyright  VHI. All rights reserved.  AROM: Neck Extension    Bend head backward.  Repeat __10__ times per set. Do __1__ sets per session. Do _2___ sessions per day.  http://orth.exer.us/300   Copyright  VHI. All rights reserved.  AROM: Neck Flexion    Bend head forward.  Repeat __10__ times per set. Do _1___ sets per session. Do ___2_ sessions per day.  http://orth.exer.us/298   Copyright  VHI. All rights reserved.  Upper Cervical Rotation    Rotate head slowly from side to side as if saying "no". Do not turn head completely to either side. Keep motion small. Repeat _10___ times per set. Do __1__ sets per session. Do ___2_ sessions per day.  http://orth.exer.us/374
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
## 2014-12-30 ENCOUNTER — Ambulatory Visit (HOSPITAL_COMMUNITY): Payer: BLUE CROSS/BLUE SHIELD | Admitting: Physical Therapy

## 2014-12-30 DIAGNOSIS — R519 Headache, unspecified: Secondary | ICD-10-CM

## 2014-12-30 DIAGNOSIS — R51 Headache: Secondary | ICD-10-CM

## 2014-12-30 DIAGNOSIS — M25512 Pain in left shoulder: Secondary | ICD-10-CM

## 2014-12-30 DIAGNOSIS — M546 Pain in thoracic spine: Secondary | ICD-10-CM

## 2014-12-30 DIAGNOSIS — M542 Cervicalgia: Secondary | ICD-10-CM | POA: Diagnosis not present

## 2014-12-30 DIAGNOSIS — R29898 Other symptoms and signs involving the musculoskeletal system: Secondary | ICD-10-CM

## 2014-12-30 DIAGNOSIS — R6889 Other general symptoms and signs: Secondary | ICD-10-CM

## 2014-12-30 NOTE — Therapy (Signed)
[place], [place], [address] Phone: [phone]   Fax:  [phone]  Physical Therapy Treatment  Patient Details  Name: Robert Shaw MRN: [id number] Date of Birth: [birth year] Referring Provider: Legrand Rams  Encounter Date: 12/30/2014      PT End of Session - 12/30/14 1523    Visit Number 2   Number of Visits 10   Date for PT Re-Evaluation 01/27/15   Authorization Type BCBS   Authorization Time Period 12/28/14-02/27/15   PT Start Time 1435   PT Stop Time 1515   PT Time Calculation (min) 40 min   Activity Tolerance Patient tolerated treatment well   Behavior During Therapy [facility] for tasks assessed/performed      Past Medical History  Diagnosis Date  . Coronary artery disease   . 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 Family history of heart disease   . Hypertension   . Dyslipidemia   . Tobacco abuse   . NSTEMI (non-ST elevated myocardial infarction) ([place])     h/o  . History of nuclear stress test 07/13/2008    dipyridamole; normal perfusion, no ischemia, low risk     Past Surgical History  Procedure Laterality Date  . Cardiac catheterization  04/2001    after high lateral MI, preserved LV function w/mild mid anterolateral hypokinesia, diffuse coronary irregularity with signficiant obstruction in LAD, OM1, superior branch of ramus (Dr. Margo Aye - [facility])  . Coronary angioplasty with stent placement  06/19/2001    Cfx (3.5x59mm Zeta stent) & diagonal stenting (2.5x12mm pixel stent) w/moderate LAD disease (Dr. Corky Downs)   . Cardiac catheterization  09/23/2001    patent stents from 06/2001 (Dr. Jackie Plum)  . Cardiac catheterization  09/17/2002    no restenosis at prior intervention sites (Dr. Corky Downs)  . Cardiac catheterization  09/09/2009    no significant CAD, patent stents in OM1 & LAD (Dr. Corky Downs)   . Coronary angioplasty with stent placement  03/17/2012    Xience DES (2.25x43mm) of  mid-distal LAD (Dr. Corky Downs)  . Transthoracic echocardiogram  03/29/2006    EF normal; mild MR & TR; AV mildly sclerotic  . Left heart catheterization with coronary angiogram N/A 03/17/2012    Procedure: LEFT HEART CATHETERIZATION WITH CORONARY ANGIOGRAM;  Surgeon: Pixie Casino, MD;  Location: [facility] CATH LAB;  Service: Cardiovascular;  Laterality: N/A;  . Percutaneous coronary stent intervention (pci-s)  03/17/2012    Procedure: PERCUTANEOUS CORONARY STENT INTERVENTION (PCI-S);  Surgeon: Pixie Casino, MD;  Location: [facility] CATH LAB;  Service: Cardiovascular;;    There were no vitals filed for this visit.  Visit Diagnosis:  Neck pain  Bilateral thoracic back pain  Bilateral headaches  Decreased functional activity tolerance  Trigger point of shoulder region, left  Weakness of both arms      Subjective Assessment - 12/30/14 1526    Subjective Pt states he has been completing his HEP.  States he continues to have 5/10 pain in Lt cervical region with most diffiuclty rotating his head to his left.    Currently in Pain? Yes   Pain Score 5    Pain Location Neck   Pain Orientation Left   Pain Descriptors / Indicators Burning;Numbness   Pain Type Acute pain   Pain Radiating Towards Lt UE                         [facility] Adult PT  Treatment/Exercise - 12/30/14 0001    Neck Exercises: Seated   Cervical Rotation Both;5 reps   Lateral Flexion Both;5 reps   Neck Exercises: Supine   Cervical Isometrics Flexion;Extension;Limitations   Cervical Isometrics Limitations flexion chin to fist, extension into pillow   Upper Extremity Flexion with Stabilization 10 reps   Manual Therapy   Manual Therapy Soft tissue mobilization   Manual therapy comments seated to Lt upper trap   Soft tissue mobilization STM to bilateral cervical parapsinals, levator scap, upper trap, rhomboid                PT Education - 12/30/14 1525    Education provided Yes   Education Details HEP   and copy of initial evaluation given with explanation of measures and goals.    Person(s) Educated Patient   Methods Explanation;Verbal cues;Tactile cues;Handout   Comprehension Verbalized understanding;Returned demonstration;Verbal cues required;Tactile cues required          PT Short Term Goals - 12/28/14 1456    PT SHORT TERM GOAL #1   Title Pt will be independent with HEP.    Time 2   Period Weeks   Status New   PT SHORT TERM GOAL #2   Title Iimprove cervical flexion/extension by 10 degrees to allow for pt to complete showering without pain.   Time 2   Period Weeks   Status New   PT SHORT TERM GOAL #3   Title Improve cervical rotation by 15 degrees or more bilaterally to allow pt to turn his head while driving without pain.    Time 2   Period Weeks   Status New   PT SHORT TERM GOAL #4   Title Improve strength of L shoulder flexion to 4-/5 or greater to allow pt to pick up objects from overhead.    Time 2   Period Weeks   Status New           PT Long Term Goals - 12/28/14 1503    PT LONG TERM GOAL #1   Title Pt will be independent with advanced HEP.   Time 4   Period Weeks   Status New   PT LONG TERM GOAL #2   Title Improve cervical flexion and extension by 20 degrees or greater to improve ability to complete showering and cooking without pain.   Time 4   Period Weeks   Status New   PT LONG TERM GOAL #3   Title Improve cervical ROM to 60 degrees or greater to allow pt to turn head while driving without pain.    Time 4   Period Weeks   Status New   PT LONG TERM GOAL #4   Title Improve BUE strength to 4+/5 or greater to allow pt to return to lifting activities at work.    Time 4   Period Weeks   Status New               Plan - 12/30/14 1523    Clinical Impression Statement Focused on education of HEP and reducing pain through manual techniques.  PT able to recalll all HEP and complete with minimal cues needed.  Added UE flexion in supine and  isometrics for cervical extensions.  Pt with multiple spasms in Lt upper trap and rhomboid today.  Noted improvement in cervical rotation and reduction of pain at end of session.     Pt will benefit from skilled therapeutic intervention in order to improve on the following deficits Decreased  activity tolerance;Decreased range of motion;Decreased strength;Hypomobility;Impaired UE functional use;Postural dysfunction;Pain   Rehab Potential Good   PT Frequency 2x / week   PT Duration 4 weeks   PT Treatment/Interventions ADLs/Self Care Home Management;Moist Heat;Therapeutic activities;Therapeutic exercise;Patient/family education;Manual techniques;Passive range of motion   PT Next Visit Plan Continue with STM to cervical musculature and begin postural strengthening   Consulted and Agree with Plan of Care Patient        Problem List Patient Active Problem List   Diagnosis Date Noted  . Hyperlipidemia 12/30/2012  . Tobacco dependence 12/30/2012  . 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 CKD (chronic kidney disease) stage 2, GFR 60-89 ml/min 03/16/2012  . NSTEMI (non-ST elevated myocardial infarction), 03/14/12 03/14/2012  . CAD (coronary artery disease), stent-DES-Xience to mid LAD 03/17/2012 with residual disease distally 03/14/2012    Teena Irani, PTA/CLT [phone] 12/30/2014, 3:28 PM  [place] [address] [place], [place], [address] Phone: [phone]   Fax:  [phone]  Name: Robert Shaw MRN: [id number] Date of Birth: October 26, 1950

## 2015-01-04 ENCOUNTER — Telehealth (HOSPITAL_COMMUNITY): Payer: Self-pay | Admitting: Physical Therapy

## 2015-01-04 ENCOUNTER — Ambulatory Visit (HOSPITAL_COMMUNITY): Payer: BLUE CROSS/BLUE SHIELD | Admitting: Physical Therapy

## 2015-01-04 DIAGNOSIS — M542 Cervicalgia: Secondary | ICD-10-CM
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
## 2015-01-11 ENCOUNTER — Ambulatory Visit (HOSPITAL_COMMUNITY): Payer: BLUE CROSS/BLUE SHIELD | Admitting: Physical Therapy

## 2015-01-11 DIAGNOSIS — R6889 Other general symptoms and signs: Secondary | ICD-10-CM

## 2015-01-11 DIAGNOSIS — R519 Headache, unspecified: Secondary | ICD-10-CM

## 2015-01-11 DIAGNOSIS — M542 Cervicalgia: Secondary | ICD-10-CM

## 2015-01-11 DIAGNOSIS — M546 Pain in thoracic spine: Secondary | ICD-10-CM

## 2015-01-11 DIAGNOSIS — R51 Headache: Secondary | ICD-10-CM

## 2015-01-11 NOTE — Therapy (Signed)
[place], [place], [address] Phone: [phone]   Fax:  [phone]  Physical Therapy Treatment  Patient Details  Name: Robert Shaw MRN: [id number] Date of Birth: [birth year] Referring Provider: Legrand Rams  Encounter Date: 01/11/2015      PT End of Session - 01/11/15 1607    Visit Number 3   Number of Visits 10   Date for PT Re-Evaluation 01/27/15   Authorization Type BCBS   Authorization Time Period 12/28/14-02/27/15   PT Start Time 1431   PT Stop Time 1514   PT Time Calculation (min) 43 min   Activity Tolerance Patient tolerated treatment well   Behavior During Therapy [facility] for tasks assessed/performed      Past Medical History  Diagnosis Date  . Coronary artery disease   . 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 Family history of heart disease   . Hypertension   . Dyslipidemia   . Tobacco abuse   . NSTEMI (non-ST elevated myocardial infarction) ([place])     h/o  . History of nuclear stress test 07/13/2008    dipyridamole; normal perfusion, no ischemia, low risk     Past Surgical History  Procedure Laterality Date  . Cardiac catheterization  04/2001    after high lateral MI, preserved LV function w/mild mid anterolateral hypokinesia, diffuse coronary irregularity with signficiant obstruction in LAD, OM1, superior branch of ramus (Dr. Margo Aye - [facility])  . Coronary angioplasty with stent placement  06/19/2001    Cfx (3.5x49mm Zeta stent) & diagonal stenting (2.5x21mm pixel stent) w/moderate LAD disease (Dr. Corky Downs)   . Cardiac catheterization  09/23/2001    patent stents from 06/2001 (Dr. Jackie Plum)  . Cardiac catheterization  09/17/2002    no restenosis at prior intervention sites (Dr. Corky Downs)  . Cardiac catheterization  09/09/2009    no significant CAD, patent stents in OM1 & LAD (Dr. Corky Downs)   . Coronary angioplasty with stent placement  03/17/2012    Xience DES (2.25x29mm) of  mid-distal LAD (Dr. Corky Downs)  . Transthoracic echocardiogram  03/29/2006    EF normal; mild MR & TR; AV mildly sclerotic  . Left heart catheterization with coronary angiogram N/A 03/17/2012    Procedure: LEFT HEART CATHETERIZATION WITH CORONARY ANGIOGRAM;  Surgeon: Pixie Casino, MD;  Location: [facility] CATH LAB;  Service: Cardiovascular;  Laterality: N/A;  . Percutaneous coronary stent intervention (pci-s)  03/17/2012    Procedure: PERCUTANEOUS CORONARY STENT INTERVENTION (PCI-S);  Surgeon: Pixie Casino, MD;  Location: [facility] CATH LAB;  Service: Cardiovascular;;    There were no vitals filed for this visit.  Visit Diagnosis:  Neck pain  Bilateral thoracic back pain  Bilateral headaches  Decreased functional activity tolerance      Subjective Assessment - 01/11/15 1435    Subjective Pt reports that his neck feels ok today, he still has some pain in L side of his neck. His pain is the worst when he first wakes up.    Currently in Pain? Yes   Pain Score 5    Pain Location Neck   Pain Orientation Left                         [facility] Adult PT Treatment/Exercise - 01/11/15 0001    Neck Exercises: Theraband   Scapula Retraction 10 reps;Red   Shoulder Extension 10 reps;Red   Rows 10 reps;Red   Neck Exercises: Seated  Other Seated Exercise seated AROM neck flexion/extension, rotation, and sidebending   Neck Exercises: Supine   Cervical Isometrics Flexion;Extension;Limitations   Cervical Isometrics Limitations flexion chin to fist, extension into pillow   Neck Retraction 10 reps;3 secs   Shoulder ABduction 10 reps   Shoulder Abduction Limitations red tband   Manual Therapy   Manual Therapy Joint mobilization;Soft tissue mobilization   Joint Mobilization grade II-III P/A mobs to mid thoracic spine performed prior to therex   Soft tissue mobilization STM to L upper trap, paraspinals, suboccipital mm in sitting performed following therex                  PT  Short Term Goals - 12/28/14 1456    PT SHORT TERM GOAL #1   Title Pt will be independent with HEP.    Time 2   Period Weeks   Status New   PT SHORT TERM GOAL #2   Title Iimprove cervical flexion/extension by 10 degrees to allow for pt to complete showering without pain.   Time 2   Period Weeks   Status New   PT SHORT TERM GOAL #3   Title Improve cervical rotation by 15 degrees or more bilaterally to allow pt to turn his head while driving without pain.    Time 2   Period Weeks   Status New   PT SHORT TERM GOAL #4   Title Improve strength of L shoulder flexion to 4-/5 or greater to allow pt to pick up objects from overhead.    Time 2   Period Weeks   Status New           PT Long Term Goals - 12/28/14 1503    PT LONG TERM GOAL #1   Title Pt will be independent with advanced HEP.   Time 4   Period Weeks   Status New   PT LONG TERM GOAL #2   Title Improve cervical flexion and extension by 20 degrees or greater to improve ability to complete showering and cooking without pain.   Time 4   Period Weeks   Status New   PT LONG TERM GOAL #3   Title Improve cervical ROM to 60 degrees or greater to allow pt to turn head while driving without pain.    Time 4   Period Weeks   Status New   PT LONG TERM GOAL #4   Title Improve BUE strength to 4+/5 or greater to allow pt to return to lifting activities at work.    Time 4   Period Weeks   Status New               Plan - 01/11/15 1607    Clinical Impression Statement Continued with cervical ROM and isometric strengthening today, chin tucks were added in supine for deep neck flexor strengthening. P/A mobs were performed to mid-thoracic spine to improve spinal mobility and decrease pain. Postural strengthening in supine and standing was added today to improve strength and decrease neck pain, pt required verbal and tactile cueing for all standing postural strengthening for proper form. Treatment ended with soft tissue  mobilization to L cervical musculature. Pt reported decreased pain post treatment.    PT Next Visit Plan Continue with postural strengthening, manual to cervical musculature        Problem List Patient Active Problem List   Diagnosis Date Noted  . Hyperlipidemia 12/30/2012  . Tobacco dependence 12/30/2012  . 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 CKD (chronic kidney  disease) stage 2, GFR 60-89 ml/min 03/16/2012  . NSTEMI (non-ST elevated myocardial infarction), 03/14/12 03/14/2012  . CAD (coronary artery disease), stent-DES-Xience to mid LAD 03/17/2012 with residual disease distally 03/14/2012    Hilma Favors, PT, DPT [phone] 01/11/2015, 4:15 PM  [place], [place], [address] Phone: [phone]   Fax:  [phone]  Name: Robert Shaw MRN: [id number] Date of Birth: Aug 09, 1950

## 2015-01-13 ENCOUNTER — Ambulatory Visit (HOSPITAL_COMMUNITY): Payer: BLUE CROSS/BLUE SHIELD | Attending: Internal Medicine | Admitting: Physical Therapy

## 2015-01-13 DIAGNOSIS — R6889 Other general symptoms and signs: Secondary | ICD-10-CM

## 2015-01-13 DIAGNOSIS — M25512 Pain in left shoulder: Secondary | ICD-10-CM

## 2015-01-13 DIAGNOSIS — R29898 Other symptoms and signs involving the musculoskeletal system: Secondary | ICD-10-CM | POA: Diagnosis present

## 2015-01-13 DIAGNOSIS — R51 Headache: Secondary | ICD-10-CM | POA: Insufficient documentation

## 2015-01-13 DIAGNOSIS — M546 Pain in thoracic spine: Secondary | ICD-10-CM | POA: Diagnosis not present

## 2015-01-13 DIAGNOSIS — M542 Cervicalgia: Secondary | ICD-10-CM

## 2015-01-13 DIAGNOSIS — R519 Headache, unspecified: Secondary | ICD-10-CM

## 2015-01-13 NOTE — Therapy (Signed)
[place] [address] [place], [place], [address] Phone: [phone]   Fax:  [phone]  Physical Therapy Treatment  Patient Details  Name: Robert Shaw MRN: [id number] Date of Birth: Jul 12, 1950 Referring Provider: Legrand Rams  Encounter Date: 01/13/2015      PT End of Session - 01/13/15 1534    Visit Number 4   Number of Visits 10   Date for PT Re-Evaluation 01/27/15   Authorization Type BCBS   Authorization Time Period 12/28/14-02/27/15   PT Start Time 1430   PT Stop Time 1515   PT Time Calculation (min) 45 min   Activity Tolerance Patient tolerated treatment well   Behavior During Therapy [facility] for tasks assessed/performed      Past Medical History  Diagnosis Date  . Coronary artery disease   . 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 Family history of heart disease   . Hypertension   . Dyslipidemia   . Tobacco abuse   . NSTEMI (non-ST elevated myocardial infarction) ([place])     h/o  . History of nuclear stress test 07/13/2008    dipyridamole; normal perfusion, no ischemia, low risk     Past Surgical History  Procedure Laterality Date  . Cardiac catheterization  04/2001    after high lateral MI, preserved LV function w/mild mid anterolateral hypokinesia, diffuse coronary irregularity with signficiant obstruction in LAD, OM1, superior branch of ramus (Dr. Margo Aye - [facility])  . Coronary angioplasty with stent placement  06/19/2001    Cfx (3.5x51mm Zeta stent) & diagonal stenting (2.5x76mm pixel stent) w/moderate LAD disease (Dr. Corky Downs)   . Cardiac catheterization  09/23/2001    patent stents from 06/2001 (Dr. Jackie Plum)  . Cardiac catheterization  09/17/2002    no restenosis at prior intervention sites (Dr. Corky Downs)  . Cardiac catheterization  09/09/2009    no significant CAD, patent stents in OM1 & LAD (Dr. Corky Downs)   . Coronary angioplasty with stent placement  03/17/2012    Xience DES (2.25x77mm) of  mid-distal LAD (Dr. Corky Downs)  . Transthoracic echocardiogram  03/29/2006    EF normal; mild MR & TR; AV mildly sclerotic  . Left heart catheterization with coronary angiogram N/A 03/17/2012    Procedure: LEFT HEART CATHETERIZATION WITH CORONARY ANGIOGRAM;  Surgeon: Pixie Casino, MD;  Location: [facility] CATH LAB;  Service: Cardiovascular;  Laterality: N/A;  . Percutaneous coronary stent intervention (pci-s)  03/17/2012    Procedure: PERCUTANEOUS CORONARY STENT INTERVENTION (PCI-S);  Surgeon: Pixie Casino, MD;  Location: [facility] CATH LAB;  Service: Cardiovascular;;    There were no vitals filed for this visit.  Visit Diagnosis:  Bilateral thoracic back pain  Bilateral headaches  Weakness of both arms  Trigger point of shoulder region, left  Decreased functional activity tolerance  Neck pain      Subjective Assessment - 01/13/15 1538    Subjective Pt reports is pain is on Lt side of his neck. His pain is the worst when he first wakes up.    Currently in Pain? Yes   Pain Score 4    Pain Location Neck   Pain Orientation Left   Pain Descriptors / Indicators Burning                         [facility] Adult PT Treatment/Exercise - 01/13/15 0001    Neck Exercises: Machines for Strengthening   UBE (Upper Arm Bike) 4 minutes backward  Neck Exercises: Theraband   Scapula Retraction 10 reps;Green   Shoulder Extension 10 reps;Green   Rows 10 reps;Green   Neck Exercises: Seated   Cervical Rotation Both;5 reps   Lateral Flexion Both;5 reps   Other Seated Exercise seated AROM neck flexion/extension, rotation, and sidebending   Neck Exercises: Supine   Neck Retraction 10 reps;3 secs   Shoulder ABduction 10 reps   Shoulder Abduction Limitations green theraband   Upper Extremity Flexion with Stabilization 10 reps   Manual Therapy   Manual Therapy Soft tissue mobilization   Manual therapy comments seated to Lt upper trap   Soft tissue mobilization STM to L upper trap,  paraspinals, suboccipital mm in sitting performed following therex                  PT Short Term Goals - 12/28/14 1456    PT SHORT TERM GOAL #1   Title Pt will be independent with HEP.    Time 2   Period Weeks   Status New   PT SHORT TERM GOAL #2   Title Iimprove cervical flexion/extension by 10 degrees to allow for pt to complete showering without pain.   Time 2   Period Weeks   Status New   PT SHORT TERM GOAL #3   Title Improve cervical rotation by 15 degrees or more bilaterally to allow pt to turn his head while driving without pain.    Time 2   Period Weeks   Status New   PT SHORT TERM GOAL #4   Title Improve strength of L shoulder flexion to 4-/5 or greater to allow pt to pick up objects from overhead.    Time 2   Period Weeks   Status New           PT Long Term Goals - 12/28/14 1503    PT LONG TERM GOAL #1   Title Pt will be independent with advanced HEP.   Time 4   Period Weeks   Status New   PT LONG TERM GOAL #2   Title Improve cervical flexion and extension by 20 degrees or greater to improve ability to complete showering and cooking without pain.   Time 4   Period Weeks   Status New   PT LONG TERM GOAL #3   Title Improve cervical ROM to 60 degrees or greater to allow pt to turn head while driving without pain.    Time 4   Period Weeks   Status New   PT LONG TERM GOAL #4   Title Improve BUE strength to 4+/5 or greater to allow pt to return to lifting activities at work.    Time 4   Period Weeks   Status New               Plan - 01/13/15 1535    Clinical Impression Statement PRogressed to green theraband with postural strengthening and abduction therex.  Pt able to complete all exercises with minimal cues. continues to present with muscle spasms in Lt upper trap and rhomboid areas.  Able to reduce wtih manual techniques and improve cervical rotation.   PT Next Visit Plan Continue with postural strengthening, manual to cervical  musculature        Problem List Patient Active Problem List   Diagnosis Date Noted  . Hyperlipidemia 12/30/2012  . Tobacco dependence 12/30/2012  . 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 CKD (chronic kidney disease) stage 2, GFR 60-89 ml/min 03/16/2012  . NSTEMI (non-ST elevated  myocardial infarction), 03/14/12 03/14/2012  . CAD (coronary artery disease), stent-DES-Xience to mid LAD 03/17/2012 with residual disease distally 03/14/2012    Teena Irani, PTA/CLT [phone]  01/13/2015, 3:39 PM  [place] [address] [place], [place], [address] Phone: [phone]   Fax:  [phone]  Name: Robert Shaw MRN: [id number] Date of Birth: 05-11-1950

## 2015-01-18 ENCOUNTER — Ambulatory Visit (HOSPITAL_COMMUNITY): Payer: BLUE CROSS/BLUE SHIELD

## 2015-01-18 DIAGNOSIS — R6889 Other general symptoms and signs: Secondary | ICD-10-CM

## 2015-01-18 DIAGNOSIS — M546 Pain in thoracic spine: Secondary | ICD-10-CM | POA: Diagnosis not present

## 2015-01-18 DIAGNOSIS — R519 Headache, unspecified: Secondary | ICD-10-CM

## 2015-01-18 DIAGNOSIS — R29898 Other symptoms and signs involving the musculoskeletal system: Secondary | ICD-10-CM

## 2015-01-18 DIAGNOSIS — R51 Headache: Secondary | ICD-10-CM

## 2015-01-18 DIAGNOSIS — M542 Cervicalgia: Secondary | ICD-10-CM

## 2015-01-18 DIAGNOSIS — M25512 Pain in left shoulder: Secondary | ICD-10-CM

## 2015-01-18 NOTE — Therapy (Signed)
[facility] [address] [place], [place], [address] Phone: [phone]   Fax:  [phone]  Physical Therapy Treatment  Patient Details  Name: Robert Shaw MRN: [id number] Date of Birth: May 20, 1950 Referring Provider: Legrand Rams  Encounter Date: 01/18/2015      PT End of Session - 01/18/15 1102    Visit Number 5   Number of Visits 10   Date for PT Re-Evaluation 01/27/15   Authorization Type BCBS   Authorization Time Period 12/28/14-02/27/15   PT Start Time 1030   PT Stop Time 1058   PT Time Calculation (min) 28 min   Activity Tolerance Patient tolerated treatment well;Patient limited by pain  Worsenign soreness with L GHJ external rotation; pain with supine L shoulder flexion.    Behavior During Therapy [facility] for tasks assessed/performed      Past Medical History  Diagnosis Date  . Coronary artery disease   . 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 Family history of heart disease   . Hypertension   . Dyslipidemia   . Tobacco abuse   . NSTEMI (non-ST elevated myocardial infarction) ([place])     h/o  . History of nuclear stress test 07/13/2008    dipyridamole; normal perfusion, no ischemia, low risk     Past Surgical History  Procedure Laterality Date  . Cardiac catheterization  04/2001    after high lateral MI, preserved LV function w/mild mid anterolateral hypokinesia, diffuse coronary irregularity with signficiant obstruction in LAD, OM1, superior branch of ramus (Dr. Margo Aye - [facility])  . Coronary angioplasty with stent placement  06/19/2001    Cfx (3.5x41mm Zeta stent) & diagonal stenting (2.5x2mm pixel stent) w/moderate LAD disease (Dr. Corky Downs)   . Cardiac catheterization  09/23/2001    patent stents from 06/2001 (Dr. Jackie Plum)  . Cardiac catheterization  09/17/2002    no restenosis at prior intervention sites (Dr. Corky Downs)  . Cardiac catheterization  09/09/2009    no significant CAD, patent stents in OM1 &  LAD (Dr. Corky Downs)   . Coronary angioplasty with stent placement  03/17/2012    Xience DES (2.25x41mm) of mid-distal LAD (Dr. Corky Downs)  . Transthoracic echocardiogram  03/29/2006    EF normal; mild MR & TR; AV mildly sclerotic  . Left heart catheterization with coronary angiogram N/A 03/17/2012    Procedure: LEFT HEART CATHETERIZATION WITH CORONARY ANGIOGRAM;  Surgeon: Pixie Casino, MD;  Location: [facility] CATH LAB;  Service: Cardiovascular;  Laterality: N/A;  . Percutaneous coronary stent intervention (pci-s)  03/17/2012    Procedure: PERCUTANEOUS CORONARY STENT INTERVENTION (PCI-S);  Surgeon: Pixie Casino, MD;  Location: [facility] C[facility] LAB;  Service: Cardiovascular;;    There were no vitals filed for this visit.  Visit Diagnosis:  Bilateral thoracic back pain  Bilateral headaches  Weakness of both arms  Trigger point of shoulder region, left  Decreased functional activity tolerance  Neck pain      Subjective Assessment - 01/18/15 1043    Subjective Pt says his pain has been mostly about the same, mildly improved at night.    Patient Stated Goals Decrease pain, be able to turn head again   Currently in Pain? Yes   Pain Score 4    Pain Location Shoulder   Pain Orientation Left         Intervention:  01/18/15 1043  Exercises: -Right Sidelying- L GHJ external rotation with 3#: 4x6 (with towel roll in axilla) -Right Sidelying- L  Shoulder abduction 0-60 degrees with 3#: 2x10 -Supine L shoulder flexion 0-70 degrees with 3#: 2x10 (very painful at end of second set) -Supine L-GHJ Internal rotation with 3#: 2x10 (should in 45 degrees abduction, elbow in 90 degrees flexion.  -Seated scapular retraction T's with GreenTB: 1x12 (verbal cues for posture and mid trap activation) -Seated Rows with Green TB: 1x12 (heavy verbal and visual cues for form; "hands to armpits" seems to work best)  Manual Therapy:  -L arm pull x3 minutes 70 degrees abduction, slight horizontal adduction (try 45*  next time)  -Anterior deltoid release: MFR 3 minutes (difficult to achieve, pt unable to relax)    Education:  -discussed assurance of proper pillow height with side sleeping maintain head in alignment with trunk. -discussed potential elevation and support of LUE during sleep on side or back to avoid excessive, prolonged horizontal adduction, combined with partial shoulder flexion, a classic shoulder impingement position. Slight abduction will also increase blood flow to the rotator cuff.                       PT Education - 01/18/15 1101    Education provided No          PT Short Term Goals - 12/28/14 1456    PT SHORT TERM GOAL #1   Title Pt will be independent with HEP.    Time 2   Period Weeks   Status New   PT SHORT TERM GOAL #2   Title Iimprove cervical flexion/extension by 10 degrees to allow for pt to complete showering without pain.   Time 2   Period Weeks   Status New   PT SHORT TERM GOAL #3   Title Improve cervical rotation by 15 degrees or more bilaterally to allow pt to turn his head while driving without pain.    Time 2   Period Weeks   Status New   PT SHORT TERM GOAL #4   Title Improve strength of L shoulder flexion to 4-/5 or greater to allow pt to pick up objects from overhead.    Time 2   Period Weeks   Status New           PT Long Term Goals - 12/28/14 1503    PT LONG TERM GOAL #1   Title Pt will be independent with advanced HEP.   Time 4   Period Weeks   Status New   PT LONG TERM GOAL #2   Title Improve cervical flexion and extension by 20 degrees or greater to improve ability to complete showering and cooking without pain.   Time 4   Period Weeks   Status New   PT LONG TERM GOAL #3   Title Improve cervical ROM to 60 degrees or greater to allow pt to turn head while driving without pain.    Time 4   Period Weeks   Status New   PT LONG TERM GOAL #4   Title Improve BUE strength to 4+/5 or greater to allow pt to return to  lifting activities at work.    Time 4   Period Weeks   Status New               Plan - 01/18/15 1206    Clinical Impression Statement Short session today due to last minute scheduling change, with emphasis brought to CC which appears to be infraspinatus/supraspinatus tenderness to palpation and pain/weakness with RC testing. Pt expressing surprise at how weak RC  muscles were with mild weight training, New exercises perfomred well with heavy cues for form, but patient able to pick up on this quickly.  Mild pain increase with supine shoulder flexion, and some with        Problem List Patient Active Problem List   Diagnosis Date Noted  . Hyperlipidemia 12/30/2012  . Tobacco dependence 12/30/2012  . 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 CKD (chronic kidney disease) stage 2, GFR 60-89 ml/min 03/16/2012  . NSTEMI (non-ST elevated myocardial infarction), 03/14/12 03/14/2012  . CAD (coronary artery disease), stent-DES-Xience to mid LAD 03/17/2012 with residual disease distally 03/14/2012    Lavilla Delamora C 01/18/2015, 12:18 PM  12:18 PM  Etta Grandchild, PT, DPT [place] License # [id number]      [facility] [address] [place], [place], [address] Phone: [phone]   Fax:  [phone]  Name: PROMETHEUS CRONEY MRN: [id number] Date of Birth: 1950-04-30

## 2015-01-20 ENCOUNTER — Ambulatory Visit (HOSPITAL_COMMUNITY): Payer: BLUE CROSS/BLUE SHIELD | Admitting: Physical Therapy

## 2015-01-27 ENCOUNTER — Ambulatory Visit (HOSPITAL_COMMUNITY): Payer: BLUE CROSS/BLUE SHIELD | Admitting: Physical Therapy

## 2015-01-27 DIAGNOSIS — M546 Pain in thoracic spine: Secondary | ICD-10-CM

## 2015-01-27 DIAGNOSIS — R51 Headache: Secondary | ICD-10-CM

## 2015-01-27 DIAGNOSIS — M25512 Pain in left shoulder: Secondary | ICD-10-CM

## 2015-01-27 DIAGNOSIS — R29898 Other symptoms and signs involving the musculoskeletal system: Secondary | ICD-10-CM

## 2015-01-27 DIAGNOSIS — R6889 Other general symptoms and signs: Secondary | ICD-10-CM

## 2015-01-27 DIAGNOSIS — R519 Headache, unspecified: Secondary | ICD-10-CM

## 2015-01-27 NOTE — Therapy (Signed)
[facility] [address] [place], [place], [address] Phone: [phone]   Fax:  [phone]  Physical Therapy Treatment  Patient Details  Name: Robert Shaw MRN: [id number] Date of Birth: [birth year] Referring Provider: Legrand Rams  Encounter Date: 01/27/2015      PT End of Session - 01/27/15 1440    Visit Number 6   Number of Visits 10   Date for PT Re-Evaluation 01/27/15   Authorization Type BCBS   Authorization Time Period 12/28/14-02/27/15   PT Start Time 1435   PT Stop Time 1515   PT Time Calculation (min) 40 min   Activity Tolerance Patient tolerated treatment well;Patient limited by pain  Worsenign soreness with L GHJ external rotation; pain with supine L shoulder flexion.    Behavior During Therapy [facility] for tasks assessed/performed      Past Medical History  Diagnosis Date  . Coronary artery disease   . 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 Family history of heart disease   . Hypertension   . Dyslipidemia   . Tobacco abuse   . NSTEMI (non-ST elevated myocardial infarction) ([place])     h/o  . History of nuclear stress test 07/13/2008    dipyridamole; normal perfusion, no ischemia, low risk     Past Surgical History  Procedure Laterality Date  . Cardiac catheterization  04/2001    after high lateral MI, preserved LV function w/mild mid anterolateral hypokinesia, diffuse coronary irregularity with signficiant obstruction in LAD, OM1, superior branch of ramus (Dr. Margo Aye - [facility])  . Coronary angioplasty with stent placement  06/19/2001    Cfx (3.5x34mm Zeta stent) & diagonal stenting (2.5x92mm pixel stent) w/moderate LAD disease (Dr. Corky Downs)   . Cardiac catheterization  09/23/2001    patent stents from 06/2001 (Dr. Jackie Plum)  . Cardiac catheterization  09/17/2002    no restenosis at prior intervention sites (Dr. Corky Downs)  . Cardiac catheterization  09/09/2009    no significant CAD, patent stents in OM1 &  LAD (Dr. Corky Downs)   . Coronary angioplasty with stent placement  03/17/2012    Xience DES (2.25x16mm) of mid-distal LAD (Dr. Corky Downs)  . Transthoracic echocardiogram  03/29/2006    EF normal; mild MR & TR; AV mildly sclerotic  . Left heart catheterization with coronary angiogram N/A 03/17/2012    Procedure: LEFT HEART CATHETERIZATION WITH CORONARY ANGIOGRAM;  Surgeon: Pixie Casino, MD;  Location: [facility] CATH LAB;  Service: Cardiovascular;  Laterality: N/A;  . Percutaneous coronary stent intervention (pci-s)  03/17/2012    Procedure: PERCUTANEOUS CORONARY STENT INTERVENTION (PCI-S);  Surgeon: Pixie Casino, MD;  Location: [facility] CATH LAB;  Service: Cardiovascular;;    There were no vitals filed for this visit.  Visit Diagnosis:  Bilateral thoracic back pain  Bilateral headaches  Weakness of both arms  Trigger point of shoulder region, left  Decreased functional activity tolerance      Subjective Assessment - 01/27/15 1439    Subjective Pt states he can tell an improvement and his pain has been slightly decreased.   Currently in Pain? Yes   Pain Score 4    Pain Location Neck   Pain Orientation Left   Pain Descriptors / Indicators Burning   Pain Radiating Towards into Lt shoulder blade                         [facility] Adult PT Treatment/Exercise - 01/27/15 1442  Neck Exercises: Machines for Strengthening   UBE (Upper Arm Bike) 4 minutes backward   Neck Exercises: Theraband   Scapula Retraction 10 reps;Green   Shoulder Extension 10 reps;Green   Rows 10 reps;Green   Neck Exercises: Seated   W Back Weights (lbs) 10 reps   Shoulder Flexion 10 reps;Both   Shoulder ABduction 10 reps;Both   Other Seated Exercise seated AROM neck flexion/extension, rotation, and sidebending   Neck Exercises: Supine   Neck Retraction 10 reps;3 secs   Shoulder ABduction 15 reps   Manual Therapy   Manual Therapy Soft tissue mobilization   Manual therapy comments seated to Lt upper trap    Soft tissue mobilization STM to L upper trap, paraspinals, suboccipital mm in sitting performed following therex                  PT Short Term Goals - 12/28/14 1456    PT SHORT TERM GOAL #1   Title Pt will be independent with HEP.    Time 2   Period Weeks   Status New   PT SHORT TERM GOAL #2   Title Iimprove cervical flexion/extension by 10 degrees to allow for pt to complete showering without pain.   Time 2   Period Weeks   Status New   PT SHORT TERM GOAL #3   Title Improve cervical rotation by 15 degrees or more bilaterally to allow pt to turn his head while driving without pain.    Time 2   Period Weeks   Status New   PT SHORT TERM GOAL #4   Title Improve strength of L shoulder flexion to 4-/5 or greater to allow pt to pick up objects from overhead.    Time 2   Period Weeks   Status New           PT Long Term Goals - 12/28/14 1503    PT LONG TERM GOAL #1   Title Pt will be independent with advanced HEP.   Time 4   Period Weeks   Status New   PT LONG TERM GOAL #2   Title Improve cervical flexion and extension by 20 degrees or greater to improve ability to complete showering and cooking without pain.   Time 4   Period Weeks   Status New   PT LONG TERM GOAL #3   Title Improve cervical ROM to 60 degrees or greater to allow pt to turn head while driving without pain.    Time 4   Period Weeks   Status New   PT LONG TERM GOAL #4   Title Improve BUE strength to 4+/5 or greater to allow pt to return to lifting activities at work.    Time 4   Period Weeks   Status New               Plan - 01/27/15 1441    Clinical Impression Statement Pt forgot his last appointment.  continued focus on improviing postural and rotator cuff muscle strength.  ABle to increase reps and added seated w-backs and shoulder flexion/abduction.  Pt given  theraband and written instructions to continue at home.  Able to complete in good form. spasms are overal improved as  well as mobility.  Pt reports sleeping better and has returned to painting/tilling actvities as well.     PT Next Visit Plan Re-evaluate next session.         Problem List Patient Active Problem List   Diagnosis Date Noted  .  Hyperlipidemia 12/30/2012  . Tobacco dependence 12/30/2012  . 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 CKD (chronic kidney disease) stage 2, GFR 60-89 ml/min 03/16/2012  . NSTEMI (non-ST elevated myocardial infarction), 03/14/12 03/14/2012  . CAD (coronary artery disease), stent-DES-Xience to mid LAD 03/17/2012 with residual disease distally 03/14/2012    Teena Irani, PTA/CLT [phone]  01/27/2015, 3:11 PM  [facility] [address] [place], [place], [address] Phone: [phone]   Fax:  [phone]  Name: CHAKA WOODROOF MRN: [id number] Date of Birth: 1950/02/26

## 2015-02-01 ENCOUNTER — Ambulatory Visit (HOSPITAL_COMMUNITY): Payer: BLUE CROSS/BLUE SHIELD | Admitting: Physical Therapy

## 2015-02-01 DIAGNOSIS — M546 Pain in thoracic spine: Secondary | ICD-10-CM

## 2015-02-01 DIAGNOSIS — R6889 Other general symptoms and signs: Secondary | ICD-10-CM

## 2015-02-01 DIAGNOSIS — R29898 Other symptoms and signs involving the musculoskeletal system: Secondary | ICD-10-CM

## 2015-02-01 DIAGNOSIS — R51 Headache: Secondary | ICD-10-CM

## 2015-02-01 DIAGNOSIS — M25512 Pain in left shoulder: Secondary | ICD-10-CM

## 2015-02-01 DIAGNOSIS — R519 Headache, unspecified: Secondary | ICD-10-CM

## 2015-02-01 DIAGNOSIS — M542 Cervicalgia: Secondary | ICD-10-CM

## 2015-02-01 NOTE — Therapy (Signed)
[facility] [address] [place], [place], [address] Phone: [phone]   Fax:  [phone]  Physical Therapy Treatment  Patient Details  Name: Robert Shaw MRN: [id number] Date of Birth: 06-17-1950 Referring Provider: Legrand Rams  Encounter Date: 02/01/2015      PT End of Session - 02/01/15 1328    Visit Number 7   Number of Visits 10   Date for PT Re-Evaluation 01/27/15   Authorization Type BCBS   Authorization Time Period 12/28/14-02/27/15   PT Start Time 1304   PT Stop Time 1335   PT Time Calculation (min) 31 min   Activity Tolerance Patient tolerated treatment well;Patient limited by pain  Worsenign soreness with L GHJ external rotation; pain with supine L shoulder flexion.    Behavior During Therapy [facility] for tasks assessed/performed      Past Medical History  Diagnosis Date  . Coronary artery disease   . 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 Family history of heart disease   . Hypertension   . Dyslipidemia   . Tobacco abuse   . NSTEMI (non-ST elevated myocardial infarction) ([place])     h/o  . History of nuclear stress test 07/13/2008    dipyridamole; normal perfusion, no ischemia, low risk     Past Surgical History  Procedure Laterality Date  . Cardiac catheterization  04/2001    after high lateral MI, preserved LV function w/mild mid anterolateral hypokinesia, diffuse coronary irregularity with signficiant obstruction in LAD, OM1, superior branch of ramus (Dr. Margo Aye - [facility])  . Coronary angioplasty with stent placement  06/19/2001    Cfx (3.5x51m Zeta stent) & diagonal stenting (2.5x164mpixel stent) w/moderate LAD disease (Dr. T.Corky Downs  . Cardiac catheterization  09/23/2001    patent stents from 06/2001 (Dr. J.Jackie Plum . Cardiac catheterization  09/17/2002    no restenosis at prior intervention sites (Dr. T.Corky Downs . Cardiac catheterization  09/09/2009    no significant CAD, patent stents in OM1 &  LAD (Dr. T.Corky Downs  . Coronary angioplasty with stent placement  03/17/2012    Xience DES (2.25x1536mof mid-distal LAD (Dr. T. Corky Downs. Transthoracic echocardiogram  03/29/2006    EF normal; mild MR & TR; AV mildly sclerotic  . Left heart catheterization with coronary angiogram N/A 03/17/2012    Procedure: LEFT HEART CATHETERIZATION WITH CORONARY ANGIOGRAM;  Surgeon: KenPixie CasinoD;  [facility];  Service: Cardiovascular;  Laterality: N/A;  . Percutaneous coronary stent intervention (pci-s)  03/17/2012    Procedure: PERCUTANEOUS CORONARY STENT INTERVENTION (PCI-S);  Surgeon: KenPixie CasinoD;  [facility];  Service: Cardiovascular;;    There were no vitals filed for this visit.  Visit Diagnosis:  Bilateral thoracic back pain  Bilateral headaches  Weakness of both arms  Trigger point of shoulder region, left  Decreased functional activity tolerance  Neck pain      Subjective Assessment - 02/01/15 1334    Subjective Pt states he continues to have pain of 4/10 down into Lt shoulder blade, however feels this is the normal ache/pain for his age.  Pt reports it is overall much better and back to all his normal actvities.    Currently in Pain? Yes   Pain Score 4    Pain Location Neck   Pain Orientation Left   Pain Descriptors / Indicators Aching            [facility]  PT Assessment - 02/01/15 0001    Assessment   Medical Diagnosis Neck/back pain   Referring Provider fanta   AROM   Right Shoulder Flexion 150 Degrees  was 130   Right Shoulder ABduction 130 Degrees  was 120   Left Shoulder Flexion 150 Degrees  was 130   Left Shoulder ABduction 130 Degrees  was 120   Cervical Flexion 35  was 35   Cervical Extension 25  was 15   Cervical - Right Side Bend 20  was 13   Cervical - Left Side Bend 25  was 23   Cervical - Right Rotation 40  was 30   Cervical - Left Rotation 38  was 37   Strength   Right Shoulder Flexion 5/5  was 4/5   Right  Shoulder Extension 5/5  was 4/5   Right Shoulder ABduction 5/5  was 4/5   Right Shoulder Internal Rotation 5/5  was 4/5   Right Shoulder External Rotation 5/5  was 4/5   Left Shoulder Flexion 4+/5  was 3/5   Left Shoulder Extension 5/5  was 4/5   Left Shoulder ABduction 5/5  was 4-/5   Left Shoulder Internal Rotation 5/5  was 4/5   Left Shoulder External Rotation 5/5  was 3/5                     [facility] Adult PT Treatment/Exercise - 02/01/15 1343    Neck Exercises: Machines for Strengthening   UBE (Upper Arm Bike) 4 minutes backward   Neck Exercises: Seated   Cervical Rotation Both;5 reps   Lateral Flexion Both;5 reps   Shoulder Flexion 10 reps;Both   Shoulder ABduction 10 reps;Both                  PT Short Term Goals - 02/01/15 1323    PT SHORT TERM GOAL #1   Title Pt will be independent with HEP.    Time 2   Period Weeks   Status Achieved   PT SHORT TERM GOAL #2   Title Iimprove cervical flexion/extension by 10 degrees to allow for pt to complete showering without pain.   Time 2   Period Weeks   Status Partially Met   PT SHORT TERM GOAL #3   Title Improve cervical rotation by 15 degrees or more bilaterally to allow pt to turn his head while driving without pain.    Time 2   Period Weeks   Status Not Met   PT SHORT TERM GOAL #4   Title Improve strength of L shoulder flexion to 4-/5 or greater to allow pt to pick up objects from overhead.    Time 2   Period Weeks   Status Achieved           PT Long Term Goals - 02/01/15 1326    PT LONG TERM GOAL #1   Title Pt will be independent with advanced HEP.   Time 4   Period Weeks   Status Achieved   PT LONG TERM GOAL #2   Title Improve cervical flexion and extension by 20 degrees or greater to improve ability to complete showering and cooking without pain.   Time 4   Period Weeks   Status Not Met   PT LONG TERM GOAL #3   Title Improve cervical ROM to 60 degrees or greater to allow pt  to turn head while driving without pain.    Time 4   Period Weeks  Status Not Met   PT LONG TERM GOAL #4   Title Improve BUE strength to 4+/5 or greater to allow pt to return to lifting activities at work.    Time 4   Period Weeks   Status Achieved               Plan - 02/01/15 1344    Clinical Impression Statement Pt has progressed well towards his goals, meeting 2/4 STG's and 2/4 LTG's.  Goals not met are those for ROM of cervical spine.  Pt with gains, however not large gains in ROM.  Pt reports he is able to complete all work related actvities with most difficulty with full rotation to look for blind spot.  PT has learned to improvise to complete this, however continues to work on his ROM with exercises.  PT feels at this time he is able to continue on his own and can be discharged from skilled therapy.     PT Next Visit Plan Discharge per patient request.        Problem List Patient Active Problem List   Diagnosis Date Noted  . Hyperlipidemia 12/30/2012  . Tobacco dependence 12/30/2012  . 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 CKD (chronic kidney disease) stage 2, GFR 60-89 ml/min 03/16/2012  . NSTEMI (non-ST elevated myocardial infarction), 03/14/12 03/14/2012  . CAD (coronary artery disease), stent-DES-Xience to mid LAD 03/17/2012 with residual disease distally 03/14/2012    Amy B Frazier, PTA/CLT [phone] 02/01/2015, 1:51 PM  [facility] [address] [place], [place], [address] Phone: [phone]   Fax:  [phone]  Name: Robert Shaw MRN: [id number] Date of Birth: 03/30/1950     

## 2015-04-18 ENCOUNTER — Encounter (HOSPITAL_COMMUNITY): Payer: Self-pay

## 2015-09-12 ENCOUNTER — Ambulatory Visit: Payer: BLUE CROSS/BLUE SHIELD | Admitting: Internal Medicine

## 2015-09-12 ENCOUNTER — Encounter: Payer: Self-pay | Admitting: *Deleted

## 2015-09-12 DIAGNOSIS — R0989 Other specified symptoms and signs involving the circulatory and respiratory systems: Secondary | ICD-10-CM

## 2015-09-14 ENCOUNTER — Ambulatory Visit (INDEPENDENT_AMBULATORY_CARE_PROVIDER_SITE_OTHER): Payer: BLUE CROSS/BLUE SHIELD | Admitting: Internal Medicine

## 2015-09-14 VITALS — BP 126/76 | HR 58 | Ht 72.0 in | Wt 170.5 lb

## 2015-09-14 DIAGNOSIS — E785 Hyperlipidemia, unspecified: Secondary | ICD-10-CM

## 2015-09-14 DIAGNOSIS — F17200 Nicotine dependence, unspecified, uncomplicated: Secondary | ICD-10-CM | POA: Diagnosis not present

## 2015-09-14 DIAGNOSIS — I2510 Atherosclerotic heart disease of native coronary artery without angina pectoris: Secondary | ICD-10-CM

## 2015-09-14 MED ORDER — NITROGLYCERIN 0.4 MG SL SUBL: 0.4000 mg | SUBLINGUAL_TABLET | SUBLINGUAL | 2 refills | Status: DC | PRN

## 2015-09-14 NOTE — Patient Instructions (Signed)
Your physician wants you to follow-up in: 1 year with Dr. Hilty. You will receive a reminder letter in the mail two months in advance. If you don't receive a letter, please call our office to schedule the follow-up appointment.  

## 2015-09-14 NOTE — Progress Notes (Signed)
OFFICE NOTE  Chief Complaint:   no complaints  Primary Care Physician: Rosita Fire, MD  HPI:  Robert Shaw is a 65 year old gentleman whom I have been following for a history of coronary disease. He had a stent in his circumflex and diagonal in 2003 and had moderate disease in the LAD and the right coronary. He continues to smoke, which I have talked to him about as a marked risk factor, and he has been working on some exercise. Unfortunately, on January 31 he had an episode when he developed chest pain, somewhat after eating with some shortness of breath, that went to both arms. He took some aspirin at home and the pain did not go away. He, therefore, presented to the emergency department. He was found to have an elevated troponin of 0.72 and underwent cardiac catheterization. That catheterization, performed by me, showed a severe discrete 95% stenosis in the mid LAD. Both the stents in the diagonal and circumflex were patent with minimal in-stent restenosis. The EF was greater than 60% with no focal wall motion abnormalities. The patient then underwent a Xience drug-eluting stent placement to the mid LAD on March 17, 2012, by Dr. Claiborne Billings. This was successful at reducing the lesion to 0% stenosis.  Since his hospitalization he has done very well. He has had no further chest pain. He was switched successfully from Plavix over to Brilinta, which he is taking with low-dose aspirin. His only complaint now is that he is having some numbness and 2 toes on his right foot. He reports he had problems with his right leg after the catheterization saying that he developed a foot drop but that has progressively gotten better over the past year.  He had no hematoma, bruit or catheterization complications that we were aware of at the time. Unfortunately, he continues to smoke, which is an ongoing risk factor.  [name] returns today for followup. He is doing well denies any chest pain or worsening  shortness of breath. He continues on aspirin and Brillinta. During his last office visit we had recommended starting Chantix for smoking cessation however he has not started that medication. He continues to smoke several cigarettes a day. He has no good reason for why he did not start the medication. He is also anticipating undergoing a switched to Medicare this year. Recent laboratory work was performed by his primary care provider which showed an impaired fasting glucose at 101 and hemoglobin A1c of 6.1 suggesting he may be prediabetic. His creatinine is 1.4. Lipid profile demonstrates total cholesterol 107, HDL 37, triglycerides 95 and LDL 51.  I saw [name] back in the office today. He is currently without any complaints. He denies any chest pain or worsening shortness of breath. Unfortunately has not been able to stop smoking but he is down to about 2 cigarettes a day. As mentioned above his last PCI was in February 2014. He remains on dual antiplatelets therapy with Brilinta, this was for non-STEMI. The data with long-term therapy of Brilinta does not go out much past 2 years therefore we may consider having him come off of this medication.  09/14/2015  [name] returns today for follow-up. He is without complaints. Overall he is doing really well. Denies any shortness of breath or chest pain. He remains physically active. He had recent cholesterol testing through his primary care provider which showed LDL less than 70. He is on Crestor 10 mg daily. Blood pressure is at goal.  PMHx:  Past Medical History:  Diagnosis Date  . 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 Coronary artery disease   . Dyslipidemia   . Family history of heart disease   . History of nuclear stress test 07/13/2008   dipyridamole; normal perfusion, no ischemia, low risk   . Hypertension   . NSTEMI (non-ST elevated myocardial infarction) ([place])    h/o  . Tobacco abuse     Past Surgical History:  Procedure Laterality Date  . CARDIAC  CATHETERIZATION  04/2001   after high lateral MI, preserved LV function w/mild mid anterolateral hypokinesia, diffuse coronary irregularity with signficiant obstruction in LAD, OM1, superior branch of ramus (Dr. Margo Aye - [facility])  . CARDIAC CATHETERIZATION  09/23/2001   patent stents from 06/2001 (Dr. Jackie Plum)  . CARDIAC CATHETERIZATION  09/17/2002   no restenosis at prior intervention sites (Dr. Corky Downs)  . CARDIAC CATHETERIZATION  09/09/2009   no significant CAD, patent stents in OM1 & LAD (Dr. Corky Downs)   . CORONARY ANGIOPLASTY WITH STENT PLACEMENT  06/19/2001   Cfx (3.5x76mm Zeta stent) & diagonal stenting (2.5x65mm pixel stent) w/moderate LAD disease (Dr. Corky Downs)   . CORONARY ANGIOPLASTY WITH STENT PLACEMENT  03/17/2012   Xience DES (2.25x14mm) of mid-distal LAD (Dr. Corky Downs)  . LEFT HEART CATHETERIZATION WITH CORONARY ANGIOGRAM N/A 03/17/2012   Procedure: LEFT HEART CATHETERIZATION WITH CORONARY ANGIOGRAM;  Surgeon: Pixie Casino, MD;  Location: [facility] CATH LAB;  Service: Cardiovascular;  Laterality: N/A;  . PERCUTANEOUS CORONARY STENT INTERVENTION (PCI-S)  03/17/2012   Procedure: PERCUTANEOUS CORONARY STENT INTERVENTION (PCI-S);  Surgeon: Pixie Casino, MD;  Location: [facility] C[facility] LAB;  Service: Cardiovascular;;  . TRANSTHORACIC ECHOCARDIOGRAM  03/29/2006   EF normal; mild MR & TR; AV mildly sclerotic    FAMHx:  Family History  Problem Relation Age of Onset  . Heart disease Brother     x3  . Stroke Father     SOCHx:   reports that he has been smoking Cigarettes.  He has a 4.00 pack-year smoking history. He has never used smokeless tobacco. He reports that he does not drink alcohol or use drugs.  ALLERGIES:  No Known Allergies  ROS: Pertinent items noted in HPI and remainder of comprehensive ROS otherwise negative.  HOME MEDS: Current Outpatient Prescriptions  Medication Sig Dispense Refill  . aspirin EC 81 MG tablet Take 81 mg by mouth daily.      . metoprolol tartrate (LOPRESSOR) 25 MG tablet Take 0.5 tablets (12.5 mg total) by mouth 2 (two) times daily. 90 tablet 1  . nitroGLYCERIN (NITROSTAT) 0.4 MG SL tablet Place 1 tablet (0.4 mg total) under the tongue every 5 (five) minutes as needed for chest pain. 25 tablet 2  . rosuvastatin (CRESTOR) 10 MG tablet TAKE 1 TABLET BY MOUTH EVERY DAY 90 tablet 3   No current facility-administered medications for this visit.     LABS/IMAGING: No results found for this or any previous visit (from the past 48 hour(s)). No results found.  VITALS: BP 126/76 (BP Location: Left Arm, Patient Position: Sitting, Cuff Size: Normal)   Pulse (!) 58   Ht 6' (1.829 m)   Wt 170 lb 8 oz (77.3 kg)   BMI 23.12 kg/m   EXAM: General appearance: alert and no distress Neck: no carotid bruit and no JVD Lungs: clear to auscultation bilaterally Heart: regular rate and rhythm, S1, S2 normal, no murmur, click, rub or gallop Abdomen: soft, non-tender; bowel sounds normal;  no masses,  no organomegaly Extremities: extremities normal, atraumatic, no cyanosis or edema Pulses: 2+ and symmetric Skin: Skin color, texture, turgor normal. No rashes or lesions Neurologic: Grossly normal Psych: Mood, affect normal  EKG: Sinus bradycardia at 59 with PACs and lateral T-wave abnormalities which are unchanged  ASSESSMENT: 1. Coronary artery disease status post PCI to the mid to distal LAD with a Xience DES (03/2012) 2. Dyslipidemia - at goal 3. Hypertension - controlled 4. Tobacco dependence - has Chantix, not ready to quit  PLAN: 1.   Mr. Tafur has good control of her cholesterol and blood pressure at this time. He's had no further anginal symptoms. We did discuss smoking cessation today and he is still not quite ready to quit. He has cut back some. I reminded him of the current guidelines for one-time CT scan of the chest to assess for small lung nodules. He may wish to address this with his primary care  provider.  Follow-up annually or sooner.  Pixie Casino, MD, [facility] Attending Cardiologist [place] [facility] 09/14/2015, 9:40 AM

## 2015-10-13 ENCOUNTER — Other Ambulatory Visit: Payer: Self-pay | Admitting: Internal Medicine

## 2015-11-10 ENCOUNTER — Other Ambulatory Visit: Payer: Self-pay | Admitting: Internal Medicine

## 2015-11-10 NOTE — Telephone Encounter (Signed)
Rx request sent to pharmacy.  

## 2016-10-08 ENCOUNTER — Encounter: Payer: Self-pay | Admitting: Internal Medicine

## 2016-10-08 ENCOUNTER — Ambulatory Visit (INDEPENDENT_AMBULATORY_CARE_PROVIDER_SITE_OTHER): Payer: Medicare Other | Admitting: Internal Medicine

## 2016-10-08 VITALS — BP 130/72 | HR 54 | Ht 72.0 in | Wt 160.0 lb

## 2016-10-08 DIAGNOSIS — I2510 Atherosclerotic heart disease of native coronary artery without angina pectoris: Secondary | ICD-10-CM

## 2016-10-08 DIAGNOSIS — Z79899 Other long term (current) drug therapy: Secondary | ICD-10-CM | POA: Diagnosis not present

## 2016-10-08 DIAGNOSIS — E785 Hyperlipidemia, unspecified: Secondary | ICD-10-CM

## 2016-10-08 DIAGNOSIS — F17200 Nicotine dependence, unspecified, uncomplicated: Secondary | ICD-10-CM

## 2016-10-08 LAB — COMPREHENSIVE METABOLIC PANEL
A/G RATIO: 1.9 (ref 1.2–2.2)
ALBUMIN: 4.6 g/dL (ref 3.6–4.8)
ALT: 14 IU/L (ref 0–44)
AST: 23 IU/L (ref 0–40)
Alkaline Phosphatase: 87 IU/L (ref 39–117)
BILIRUBIN TOTAL: 0.8 mg/dL (ref 0.0–1.2)
BUN / CREAT RATIO: 8 — AB (ref 10–24)
BUN: 11 mg/dL (ref 8–27)
CHLORIDE: 102 mmol/L (ref 96–106)
CO2: 22 mmol/L (ref 20–29)
Calcium: 9.5 mg/dL (ref 8.6–10.2)
Creatinine, Ser: 1.36 mg/dL — ABNORMAL HIGH (ref 0.76–1.27)
GFR, EST AFRICAN AMERICAN: 63 mL/min/{1.73_m2} (ref >59)
GFR, EST NON AFRICAN AMERICAN: 54 mL/min/{1.73_m2} — AB (ref >59)
Globulin, Total: 2.4 g/dL (ref 1.5–4.5)
Glucose: 84 mg/dL (ref 65–99)
POTASSIUM: 5.1 mmol/L (ref 3.5–5.2)
Sodium: 140 mmol/L (ref 134–144)
TOTAL PROTEIN: 7 g/dL (ref 6.0–8.5)

## 2016-10-08 LAB — LIPID PANEL
Chol/HDL Ratio: 2.9 ratio (ref 0.0–5.0)
Cholesterol, Total: 123 mg/dL (ref 100–199)
HDL: 42 mg/dL (ref >39)
LDL Calculated: 66 mg/dL (ref 0–99)
TRIGLYCERIDES: 77 mg/dL (ref 0–149)
VLDL Cholesterol Cal: 15 mg/dL (ref 5–40)

## 2016-10-08 MED ORDER — NITROGLYCERIN 0.4 MG SL SUBL: 0.4000 mg | SUBLINGUAL_TABLET | SUBLINGUAL | 2 refills | Status: DC | PRN

## 2016-10-08 NOTE — Progress Notes (Signed)
OFFICE NOTE  Chief Complaint:  No complaints  Primary Care Physician: Rosita Fire, MD  HPI:  Robert Shaw is a 66 year old gentleman whom I have been following for a history of coronary disease. He had a stent in his circumflex and diagonal in 2003 and had moderate disease in the LAD and the right coronary. He continues to smoke, which I have talked to him about as a marked risk factor, and he has been working on some exercise. Unfortunately, on January 31 he had an episode when he developed chest pain, somewhat after eating with some shortness of breath, that went to both arms. He took some aspirin at home and the pain did not go away. He, therefore, presented to the emergency department. He was found to have an elevated troponin of 0.72 and underwent cardiac catheterization. That catheterization, performed by me, showed a severe discrete 95% stenosis in the mid LAD. Both the stents in the diagonal and circumflex were patent with minimal in-stent restenosis. The EF was greater than 60% with no focal wall motion abnormalities. The patient then underwent a Xience drug-eluting stent placement to the mid LAD on March 17, 2012, by Dr. Claiborne Billings. This was successful at reducing the lesion to 0% stenosis.  Since his hospitalization he has done very well. He has had no further chest pain. He was switched successfully from Plavix over to Brilinta, which he is taking with low-dose aspirin. His only complaint now is that he is having some numbness and 2 toes on his right foot. He reports he had problems with his right leg after the catheterization saying that he developed a foot drop but that has progressively gotten better over the past year.  He had no hematoma, bruit or catheterization complications that we were aware of at the time. Unfortunately, he continues to smoke, which is an ongoing risk factor.  Mr. Nickless returns today for followup. He is doing well denies any chest pain or worsening  shortness of breath. He continues on aspirin and Brillinta. During his last office visit we had recommended starting Chantix for smoking cessation however he has not started that medication. He continues to smoke several cigarettes a day. He has no good reason for why he did not start the medication. He is also anticipating undergoing a switched to Medicare this year. Recent laboratory work was performed by his primary care provider which showed an impaired fasting glucose at 101 and hemoglobin A1c of 6.1 suggesting he may be prediabetic. His creatinine is 1.4. Lipid profile demonstrates total cholesterol 107, HDL 37, triglycerides 95 and LDL 51.  I saw Mr. Villard back in the office today. He is currently without any complaints. He denies any chest pain or worsening shortness of breath. Unfortunately has not been able to stop smoking but he is down to about 2 cigarettes a day. As mentioned above his last PCI was in February 2014. He remains on dual antiplatelets therapy with Brilinta, this was for non-STEMI. The data with long-term therapy of Brilinta does not go out much past 2 years therefore we may consider having him come off of this medication.  09/14/2015  Mr. Dai returns today for follow-up. He is without complaints. Overall he is doing really well. Denies any shortness of breath or chest pain. He remains physically active. He had recent cholesterol testing through his primary care provider which showed LDL less than 70. He is on Crestor 10 mg daily. Blood pressure is at goal.  10/08/2016  Mr. Willner was seen today in follow-up. This is an annual visit. Overall he is asymptomatic. He denies any chest pain or worsening shortness of breath. 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 He is a blood pressure is well-controlled today. He has not had a recent lipid check. His EKG was personally reviewed and shows stable anterolateral T-wave inversions in V3 through V6 as well as high lateral leads of  1 and aVL. He's not had any coronary events that were aware of since 2014.  PMHx:  Past Medical History:  Diagnosis Date  . 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 Coronary artery disease   . Dyslipidemia   . Family history of heart disease   . History of nuclear stress test 07/13/2008   dipyridamole; normal perfusion, no ischemia, low risk   . Hypertension   . NSTEMI (non-ST elevated myocardial infarction) ([place])    h/o  . Tobacco abuse     Past Surgical History:  Procedure Laterality Date  . CARDIAC CATHETERIZATION  04/2001   after high lateral MI, preserved LV function w/mild mid anterolateral hypokinesia, diffuse coronary irregularity with signficiant obstruction in LAD, OM1, superior branch of ramus (Dr. Margo Aye - [facility])  . CARDIAC CATHETERIZATION  09/23/2001   patent stents from 06/2001 (Dr. Jackie Plum)  . CARDIAC CATHETERIZATION  09/17/2002   no restenosis at prior intervention sites (Dr. Corky Downs)  . CARDIAC CATHETERIZATION  09/09/2009   no significant CAD, patent stents in OM1 & LAD (Dr. Corky Downs)   . CORONARY ANGIOPLASTY WITH STENT PLACEMENT  06/19/2001   Cfx (3.5x17mm Zeta stent) & diagonal stenting (2.5x28mm pixel stent) w/moderate LAD disease (Dr. Corky Downs)   . CORONARY ANGIOPLASTY WITH STENT PLACEMENT  03/17/2012   Xience DES (2.25x94mm) of mid-distal LAD (Dr. Corky Downs)  . LEFT HEART CATHETERIZATION WITH CORONARY ANGIOGRAM N/A 03/17/2012   Procedure: LEFT HEART CATHETERIZATION WITH CORONARY ANGIOGRAM;  Surgeon: Pixie Casino, MD;  Location: [facility] CATH LAB;  Service: Cardiovascular;  Laterality: N/A;  . PERCUTANEOUS CORONARY STENT INTERVENTION (PCI-S)  03/17/2012   Procedure: PERCUTANEOUS CORONARY STENT INTERVENTION (PCI-S);  Surgeon: Pixie Casino, MD;  Location: [facility] CATH LAB;  Service: Cardiovascular;;  . TRANSTHORACIC ECHOCARDIOGRAM  03/29/2006   EF normal; mild MR & TR; AV mildly sclerotic    FAMHx:  Family History  Problem Relation Age of Onset  .  Heart disease Brother        x3  . Stroke Father     SOCHx:   reports that he has been smoking Cigarettes.  He has a 4.00 pack-year smoking history. He has never used smokeless tobacco. He reports that he does not drink alcohol or use drugs.  ALLERGIES:  No Known Allergies  ROS: Pertinent items noted in HPI and remainder of comprehensive ROS otherwise negative.  HOME MEDS: Current Outpatient Prescriptions  Medication Sig Dispense Refill  . aspirin EC 81 MG tablet Take 81 mg by mouth daily.    . metoprolol tartrate (LOPRESSOR) 25 MG tablet TAKE ONE-HALF TABLET BY MOUTH TWICE DAILY 90 tablet 1  . nitroGLYCERIN (NITROSTAT) 0.4 MG SL tablet Place 1 tablet (0.4 mg total) under the tongue every 5 (five) minutes as needed for chest pain. 25 tablet 2  . rosuvastatin (CRESTOR) 10 MG tablet TAKE ONE TABLET BY MOUTH ONCE DAILY 90 tablet 3   No current facility-administered medications for this visit.     LABS/IMAGING: No results found for this or any previous visit (from  the past 48 hour(s)). No results found.  VITALS: BP 130/72   Pulse (!) 54   Ht 6' (1.829 m)   Wt 160 lb (72.6 kg)   BMI 21.70 kg/m   EXAM: General appearance: alert and no distress Neck: no carotid bruit and no JVD Lungs: clear to auscultation bilaterally Heart: regular rate and rhythm, S1, S2 normal, no murmur, click, rub or gallop Abdomen: soft, non-tender; bowel sounds normal; no masses,  no organomegaly Extremities: extremities normal, atraumatic, no cyanosis or edema Pulses: 2+ and symmetric Skin: Skin color, texture, turgor normal. No rashes or lesions Neurologic: Grossly normal Psych: Mood, affect normal  EKG: Sinus bradycardia 54, anterolateral and inferior T-wave inversions-unchanged, personally reviewed  ASSESSMENT: 1. Coronary artery disease status post PCI to the mid to distal LAD with a Xience DES (03/2012) 2. Dyslipidemia - at goal 3. Hypertension - controlled 4. Tobacco dependence - has  Chantix, not ready to quit 5. Anterolateral and inferior T-wave abnormalities-stable  PLAN: 1.   Mr. Wegman seems to be doing well symptomatically. His weight is appropriate, blood pressures controlled. He is overdue for a recheck of his cholesterol. We'll order that today. There are still ongoing tobacco dependence but is not ready to quit.  Follow-up annually or sooner as necessary.  Pixie Casino, MD, [facility] Attending Cardiologist [place] [facility] 10/08/2016, 1:16 PM

## 2016-10-08 NOTE — Patient Instructions (Signed)
Your physician recommends that you return for lab work LIPID/CMET (fasting)  Your physician wants you to follow-up in: ONE YEAR with Dr. Debara Pickett. You will receive a reminder letter in the mail two months in advance. If you don't receive a letter, please call our office to schedule the follow-up appointment.

## 2016-11-21 ENCOUNTER — Other Ambulatory Visit: Payer: Self-pay | Admitting: Internal Medicine

## 2016-12-21 ENCOUNTER — Other Ambulatory Visit: Payer: Self-pay | Admitting: *Deleted

## 2016-12-21 MED ORDER — ROSUVASTATIN CALCIUM 10 MG PO TABS: 10.0000 mg | ORAL_TABLET | Freq: Every day | ORAL | 3 refills | Status: DC

## 2016-12-24 DIAGNOSIS — I2510 Atherosclerotic heart disease of native coronary artery without angina pectoris: Secondary | ICD-10-CM | POA: Diagnosis not present

## 2016-12-24 DIAGNOSIS — Z23 Encounter for immunization: Secondary | ICD-10-CM | POA: Diagnosis not present

## 2016-12-24 DIAGNOSIS — E7849 Other hyperlipidemia: Secondary | ICD-10-CM | POA: Diagnosis not present

## 2016-12-24 DIAGNOSIS — I10 Essential (primary) hypertension: Secondary | ICD-10-CM | POA: Diagnosis not present

## 2017-01-07 ENCOUNTER — Telehealth: Payer: Self-pay

## 2017-01-07 NOTE — Telephone Encounter (Signed)
[phone] patient received letter to schedule tcs, no gastric issues or heart attacks, no blood thinners.

## 2017-01-15 NOTE — Telephone Encounter (Signed)
LMOM to call.

## 2017-01-16 ENCOUNTER — Telehealth: Payer: Self-pay

## 2017-01-16 NOTE — Telephone Encounter (Signed)
See separate triage.  

## 2017-02-07 NOTE — Telephone Encounter (Signed)
Gastroenterology Pre-Procedure Review  Request Date: 01/16/2017 Requesting Physician: Dr. Legrand Rams  PATIENT REVIEW QUESTIONS: The patient responded to the following health history questions as indicated:    1. Diabetes Melitis: no 2. Joint replacements in the past 12 months: no 3. Major health problems in the past 3 months: no 4. Has an artificial valve or MVP: no 5. Has a defibrillator: no 6. Has been advised in past to take antibiotics in advance of a procedure like teeth cleaning: no 7. Family history of colon cancer: no  8. Alcohol Use: no 9. History of sleep apnea: no  10. History of coronary artery or other vascular stents placed within the last 12 months: no 11. History of any prior anesthesia complications: no    MEDICATIONS & ALLERGIES:    Patient reports the following regarding taking any blood thinners:   Plavix? no Aspirin? YES Coumadin? no Brilinta? no Xarelto? no Eliquis? no Pradaxa? no Savaysa? no Effient? no  Patient confirms/reports the following medications:  Current Outpatient Medications  Medication Sig Dispense Refill  . aspirin EC 81 MG tablet Take 81 mg by mouth daily.    . fexofenadine (ALLEGRA) 60 MG tablet Take 60 mg by mouth 2 (two) times daily.    . metoprolol tartrate (LOPRESSOR) 25 MG tablet TAKE ONE-HALF TABLET BY MOUTH TWICE DAILY (Patient taking differently: takes one half tablet just once a day) 90 tablet 3  . rosuvastatin (CRESTOR) 10 MG tablet Take 1 tablet (10 mg total) daily by mouth. 90 tablet 3  . nitroGLYCERIN (NITROSTAT) 0.4 MG SL tablet Place 1 tablet (0.4 mg total) under the tongue every 5 (five) minutes as needed for chest pain. (Patient not taking: Reported on 01/16/2017) 25 tablet 2   No current facility-administered medications for this visit.     Patient confirms/reports the following allergies:  No Known Allergies  No orders of the defined types were placed in this encounter.   AUTHORIZATION INFORMATION Primary Insurance:    ID #:  Group #:  Pre-Cert / Auth required: Pre-Cert / Auth #:   Secondary Insurance:   ID #:   Group #:  Pre-Cert / Auth required: Pre-Cert / Auth #:   SCHEDULE INFORMATION: Procedure has been scheduled as follows:  Date: 02/25/2017                Time:  10:30 AM Location:  [facility] Short Stay  This Gastroenterology Pre-Precedure Review Form is being routed to the following provider(s): Barney Drain, MD

## 2017-02-07 NOTE — Telephone Encounter (Signed)
Ok to schedule.

## 2017-02-07 NOTE — Telephone Encounter (Signed)
Opened in error

## 2017-02-08 ENCOUNTER — Other Ambulatory Visit: Payer: Self-pay

## 2017-02-08 DIAGNOSIS — Z1211 Encounter for screening for malignant neoplasm of colon: Secondary | ICD-10-CM

## 2017-02-08 MED ORDER — NA SULFATE-K SULFATE-MG SULF 17.5-3.13-1.6 GM/177ML PO SOLN: 1.0000 | ORAL | 0 refills | Status: DC

## 2017-02-08 NOTE — Telephone Encounter (Signed)
Rx sent to the pharmacy and instructions mailed to pt.  

## 2017-02-08 NOTE — Addendum Note (Signed)
Addended by: Everardo All on: 02/08/2017 09:01 AM   Modules accepted: Orders

## 2017-02-25 ENCOUNTER — Encounter (HOSPITAL_COMMUNITY)
Admission: RE | Disposition: A | Discharge status: Home / Self Care | Payer: Self-pay | Source: Ambulatory Visit | Attending: Gastroenterology

## 2017-02-25 ENCOUNTER — Encounter (HOSPITAL_COMMUNITY): Payer: Self-pay

## 2017-02-25 ENCOUNTER — Ambulatory Visit (HOSPITAL_COMMUNITY)
Admission: RE | Admit: 2017-02-25 | Discharge: 2017-02-25 | Disposition: A | Discharge status: Home / Self Care | Payer: Medicare Other | Source: Ambulatory Visit | Attending: Gastroenterology | Admitting: Gastroenterology

## 2017-02-25 ENCOUNTER — Other Ambulatory Visit: Payer: Self-pay

## 2017-02-25 DIAGNOSIS — Q438 Other specified congenital malformations of intestine: Secondary | ICD-10-CM | POA: Diagnosis not present

## 2017-02-25 DIAGNOSIS — E785 Hyperlipidemia, unspecified: Secondary | ICD-10-CM | POA: Insufficient documentation

## 2017-02-25 DIAGNOSIS — D124 Benign neoplasm of descending colon: Secondary | ICD-10-CM | POA: Diagnosis not present

## 2017-02-25 DIAGNOSIS — Z1212 Encounter for screening for malignant neoplasm of rectum: Secondary | ICD-10-CM

## 2017-02-25 DIAGNOSIS — D122 Benign neoplasm of ascending colon: Secondary | ICD-10-CM | POA: Diagnosis not present

## 2017-02-25 DIAGNOSIS — D123 Benign neoplasm of transverse colon: Secondary | ICD-10-CM | POA: Insufficient documentation

## 2017-02-25 DIAGNOSIS — Z955 Presence of coronary angioplasty implant and graft: Secondary | ICD-10-CM | POA: Insufficient documentation

## 2017-02-25 DIAGNOSIS — D120 Benign neoplasm of cecum: Secondary | ICD-10-CM | POA: Insufficient documentation

## 2017-02-25 DIAGNOSIS — I2510 Atherosclerotic heart disease of native coronary artery without angina pectoris: Secondary | ICD-10-CM | POA: Insufficient documentation

## 2017-02-25 DIAGNOSIS — K644 Residual hemorrhoidal skin tags: Secondary | ICD-10-CM | POA: Diagnosis not present

## 2017-02-25 DIAGNOSIS — F17210 Nicotine dependence, cigarettes, uncomplicated: Secondary | ICD-10-CM | POA: Insufficient documentation

## 2017-02-25 DIAGNOSIS — Z7982 Long term (current) use of aspirin: Secondary | ICD-10-CM | POA: Diagnosis not present

## 2017-02-25 DIAGNOSIS — Z1211 Encounter for screening for malignant neoplasm of colon: Secondary | ICD-10-CM | POA: Diagnosis not present

## 2017-02-25 DIAGNOSIS — I10 Essential (primary) hypertension: Secondary | ICD-10-CM | POA: Diagnosis not present

## 2017-02-25 DIAGNOSIS — Z79899 Other long term (current) drug therapy: Secondary | ICD-10-CM | POA: Diagnosis not present

## 2017-02-25 DIAGNOSIS — I252 Old myocardial infarction: Secondary | ICD-10-CM | POA: Insufficient documentation

## 2017-02-25 DIAGNOSIS — K5730 Diverticulosis of large intestine without perforation or abscess without bleeding: Secondary | ICD-10-CM | POA: Diagnosis not present

## 2017-02-25 DIAGNOSIS — K635 Polyp of colon: Secondary | ICD-10-CM

## 2017-02-25 HISTORY — PX: POLYPECTOMY: SHX5525

## 2017-02-25 HISTORY — PX: COLONOSCOPY: SHX5424

## 2017-02-25 SURGERY — COLONOSCOPY
Anesthesia: Moderate Sedation

## 2017-02-25 MED ORDER — MEPERIDINE HCL 100 MG/ML IJ SOLN
INTRAMUSCULAR | Status: DC | PRN
  Administered 2017-02-25 (×2): 25 mg via INTRAVENOUS

## 2017-02-25 MED ORDER — SODIUM CHLORIDE 0.9 % IV SOLN
INTRAVENOUS | Status: DC
  Administered 2017-02-25: 10:00:00 via INTRAVENOUS

## 2017-02-25 MED ORDER — MIDAZOLAM HCL 5 MG/5ML IJ SOLN
INTRAMUSCULAR | Status: AC
  Filled 2017-02-25: qty 10

## 2017-02-25 MED ORDER — MEPERIDINE HCL 100 MG/ML IJ SOLN
INTRAMUSCULAR | Status: AC
  Filled 2017-02-25: qty 2

## 2017-02-25 MED ORDER — MIDAZOLAM HCL 5 MG/5ML IJ SOLN
INTRAMUSCULAR | Status: DC | PRN
  Administered 2017-02-25 (×2): 2 mg via INTRAVENOUS

## 2017-02-25 NOTE — Discharge Instructions (Signed)
You have small EXTERNAL hemorrhoids and diverticulosis IN YOUR LEFT AND RIGHT COLON. YOU HAD TEN POLYPS REMOVED.    DRINK WATER TO KEEP YOUR URINE LIGHT YELLOW.  FOLLOW A HIGH FIBER DIET. AVOID ITEMS THAT CAUSE BLOATING. See info below.  YOUR BIOPSY RESULTS WILL BE AVAILABLE IN MY CHART AFTER JAN 17 AND MY OFFICE WILL CONTACT YOU IN 10-14 DAYS WITH YOUR RESULTS.   USE PREPARATION H FOUR TIMES  A DAY IF NEEDED TO RELIEVE RECTAL PAIN/PRESSURE/BLEEDING.\  Next colonoscopy in 1-3 years. YOUR SISTERS, BROTHERS, CHILDREN, AND PARENTS NEED TO HAVE A COLONOSCOPY STARTING AT THE AGE OF 40.    Colonoscopy Care After Read the instructions outlined below and refer to this sheet in the next week. These discharge instructions provide you with general information on caring for yourself after you leave the hospital. While your treatment has been planned according to the most current medical practices available, unavoidable complications occasionally occur. If you have any problems or questions after discharge, call DR. Sherl Yzaguirre, [phone].  ACTIVITY  You may resume your regular activity, but move at a slower pace for the next 24 hours.   Take frequent rest periods for the next 24 hours.   Walking will help get rid of the air and reduce the bloated feeling in your belly (abdomen).   No driving for 24 hours (because of the medicine (anesthesia) used during the test).   You may shower.   Do not sign any important legal documents or operate any machinery for 24 hours (because of the anesthesia used during the test).    NUTRITION  Drink plenty of fluids.   You may resume your normal diet as instructed by your doctor.   Begin with a light meal and progress to your normal diet. Heavy or fried foods are harder to digest and may make you feel sick to your stomach (nauseated).   Avoid alcoholic beverages for 24 hours or as instructed.    MEDICATIONS  You may resume your normal  medications.   WHAT YOU CAN EXPECT TODAY  Some feelings of bloating in the abdomen.   Passage of more gas than usual.   Spotting of blood in your stool or on the toilet paper  .  IF YOU HAD POLYPS REMOVED DURING THE COLONOSCOPY:  Eat a soft diet IF YOU HAVE NAUSEA, BLOATING, ABDOMINAL PAIN, OR VOMITING.    FINDING OUT THE RESULTS OF YOUR TEST Not all test results are available during your visit. DR. Oneida Alar WILL CALL YOU WITHIN 14 DAYS OF YOUR PROCEDUE WITH YOUR RESULTS. Do not assume everything is normal if you have not heard from DR. Cailen Mihalik, CALL HER OFFICE AT [phone].  SEEK IMMEDIATE MEDICAL ATTENTION AND CALL THE OFFICE: [phone] IF:  You have more than a spotting of blood in your stool.   Your belly is swollen (abdominal distention).   You are nauseated or vomiting.   You have a temperature over 101F.   You have abdominal pain or discomfort that is severe or gets worse throughout the day.  High-Fiber Diet A high-fiber diet changes your normal diet to include more whole grains, legumes, fruits, and vegetables. Changes in the diet involve replacing refined carbohydrates with unrefined foods. The calorie level of the diet is essentially unchanged. The Dietary Reference Intake (recommended amount) for adult males is 38 grams per day. For adult females, it is 25 grams per day. Pregnant and lactating women should consume 28 grams of fiber per day. Fiber is the intact  part of a plant that is not broken down during digestion. Functional fiber is fiber that has been isolated from the plant to provide a beneficial effect in the body. PURPOSE  Increase stool bulk.   Ease and regulate bowel movements.   Lower cholesterol.   REDUCE RISK OF COLON CANCER  INDICATIONS THAT YOU NEED MORE FIBER  Constipation and hemorrhoids.   Uncomplicated diverticulosis (intestine condition) and irritable bowel syndrome.   Weight management.   As a protective measure against  hardening of the arteries (atherosclerosis), diabetes, and cancer.   GUIDELINES FOR INCREASING FIBER IN THE DIET  Start adding fiber to the diet slowly. A gradual increase of about 5 more grams (2 slices of whole-wheat bread, 2 servings of most fruits or vegetables, or 1 bowl of high-fiber cereal) per day is best. Too rapid an increase in fiber may result in constipation, flatulence, and bloating.   Drink enough water and fluids to keep your urine clear or pale yellow. Water, juice, or caffeine-free drinks are recommended. Not drinking enough fluid may cause constipation.   Eat a variety of high-fiber foods rather than one type of fiber.   Try to increase your intake of fiber through using high-fiber foods rather than fiber pills or supplements that contain small amounts of fiber.   The goal is to change the types of food eaten. Do not supplement your present diet with high-fiber foods, but replace foods in your present diet.   INCLUDE A VARIETY OF FIBER SOURCES  Replace refined and processed grains with whole grains, canned fruits with fresh fruits, and incorporate other fiber sources. White rice, white breads, and most bakery goods contain little or no fiber.   Brown whole-grain rice, buckwheat oats, and many fruits and vegetables are all good sources of fiber. These include: broccoli, Brussels sprouts, cabbage, cauliflower, beets, sweet potatoes, white potatoes (skin on), carrots, tomatoes, eggplant, squash, berries, fresh fruits, and dried fruits.   Cereals appear to be the richest source of fiber. Cereal fiber is found in whole grains and bran. Bran is the fiber-rich outer coat of cereal grain, which is largely removed in refining. In whole-grain cereals, the bran remains. In breakfast cereals, the largest amount of fiber is found in those with "bran" in their names. The fiber content is sometimes indicated on the label.   You may need to include additional fruits and vegetables each day.    In baking, for 1 cup white flour, you may use the following substitutions:   1 cup whole-wheat flour minus 2 tablespoons.   1/2 cup white flour plus 1/2 cup whole-wheat flour.   Polyps, Colon  A polyp is extra tissue that grows inside your body. Colon polyps grow in the large intestine. The large intestine, also called the colon, is part of your digestive system. It is a long, hollow tube at the end of your digestive tract where your body makes and stores stool. Most polyps are not dangerous. They are benign. This means they are not cancerous. But over time, some types of polyps can turn into cancer. Polyps that are smaller than a pea are usually not harmful. But larger polyps could someday become or may already be cancerous. To be safe, doctors remove all polyps and test them.   PREVENTION There is not one sure way to prevent polyps. You might be able to lower your risk of getting them if you:  Eat more fruits and vegetables and less fatty food.   Do not smoke.  Avoid alcohol.   Exercise every day.   Lose weight if you are overweight.   Eating more calcium and folate can also lower your risk of getting polyps. Some foods that are rich in calcium are milk, cheese, and broccoli. Some foods that are rich in folate are chickpeas, kidney beans, and spinach.    Diverticulosis Diverticulosis is a common condition that develops when small pouches (diverticula) form in the wall of the colon. The risk of diverticulosis increases with age. It happens more often in people who eat a low-fiber diet. Most individuals with diverticulosis have no symptoms. Those individuals with symptoms usually experience belly (abdominal) pain, constipation, or loose stools (diarrhea).  HOME CARE INSTRUCTIONS  Increase the amount of fiber in your diet as directed by your caregiver or dietician. This may reduce symptoms of diverticulosis.   Drink at least 6 to 8 glasses of water each day to prevent  constipation.   Try not to strain when you have a bowel movement.   Avoiding nuts and seeds to prevent complications is NOT NECESSARY.   FOODS HAVING HIGH FIBER CONTENT INCLUDE:  Fruits. Apple, peach, pear, tangerine, raisins, prunes.   Vegetables. Brussels sprouts, asparagus, broccoli, cabbage, carrot, cauliflower, romaine lettuce, spinach, summer squash, tomato, winter squash, zucchini.   Starchy Vegetables. Baked beans, kidney beans, lima beans, split peas, lentils, potatoes (with skin).   Grains. Whole wheat bread, brown rice, bran flake cereal, plain oatmeal, white rice, shredded wheat, bran muffins.   SEEK IMMEDIATE MEDICAL CARE IF:  You develop increasing pain or severe bloating.   You have an oral temperature above 101F.   You develop vomiting or bowel movements that are bloody or black.   Hemorrhoids Hemorrhoids are dilated (enlarged) veins around the rectum. Sometimes clots will form in the veins. This makes them swollen and painful. These are called thrombosed hemorrhoids. Causes of hemorrhoids include:  Constipation.   Straining to have a bowel movement.   HEAVY LIFTING   HOME CARE INSTRUCTIONS  Eat a well balanced diet and drink 6 to 8 glasses of water every day to avoid constipation. You may also use a bulk laxative.   Avoid straining to have bowel movements.   Keep anal area dry and clean.   Do not use a donut shaped pillow or sit on the toilet for long periods. This increases blood pooling and pain.   Move your bowels when your body has the urge; this will require less straining and will decrease pain and pressure.

## 2017-02-25 NOTE — H&P (Signed)
Primary Care Physician:  Rosita Fire, MD Primary Gastroenterologist:  Dr. Oneida Alar  Pre-Procedure History & Physical: HPI:  Robert Shaw is a 67 y.o. male here for COLON CANCER SCREENING.  Past Medical History:  Diagnosis Date  . 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 Coronary artery disease   . Dyslipidemia   . Family history of heart disease   . History of nuclear stress test 07/13/2008   dipyridamole; normal perfusion, no ischemia, low risk   . Hypertension   . NSTEMI (non-ST elevated myocardial infarction) ([place])    h/o  . Tobacco abuse     Past Surgical History:  Procedure Laterality Date  . CARDIAC CATHETERIZATION  04/2001   after high lateral MI, preserved LV function w/mild mid anterolateral hypokinesia, diffuse coronary irregularity with signficiant obstruction in LAD, OM1, superior branch of ramus (Dr. Margo Aye - [facility])  . CARDIAC CATHETERIZATION  09/23/2001   patent stents from 06/2001 (Dr. Jackie Plum)  . CARDIAC CATHETERIZATION  09/17/2002   no restenosis at prior intervention sites (Dr. Corky Downs)  . CARDIAC CATHETERIZATION  09/09/2009   no significant CAD, patent stents in OM1 & LAD (Dr. Corky Downs)   . CORONARY ANGIOPLASTY WITH STENT PLACEMENT  06/19/2001   Cfx (3.5x83mm Zeta stent) & diagonal stenting (2.5x36mm pixel stent) w/moderate LAD disease (Dr. Corky Downs)   . CORONARY ANGIOPLASTY WITH STENT PLACEMENT  03/17/2012   Xience DES (2.25x86mm) of mid-distal LAD (Dr. Corky Downs)  . LEFT HEART CATHETERIZATION WITH CORONARY ANGIOGRAM N/A 03/17/2012   Procedure: LEFT HEART CATHETERIZATION WITH CORONARY ANGIOGRAM;  Surgeon: Pixie Casino, MD;  Location: [facility] CATH LAB;  Service: Cardiovascular;  Laterality: N/A;  . PERCUTANEOUS CORONARY STENT INTERVENTION (PCI-S)  03/17/2012   Procedure: PERCUTANEOUS CORONARY STENT INTERVENTION (PCI-S);  Surgeon: Pixie Casino, MD;  Location: [facility] CATH LAB;  Service: Cardiovascular;;  . TRANSTHORACIC ECHOCARDIOGRAM  03/29/2006   EF normal; mild MR & TR; AV mildly sclerotic    Prior to Admission medications   Medication Sig Start Date End Date Taking? Authorizing Provider  aspirin EC 81 MG tablet Take 81 mg by mouth daily.   Yes [provider]  loratadine (CLARITIN) 10 MG tablet Take 10 mg by mouth daily.   Yes [provider]  metoprolol tartrate (LOPRESSOR) 25 MG tablet Take 12.5 mg by mouth every evening.   Yes [provider]  rosuvastatin (CRESTOR) 10 MG tablet Take 1 tablet (10 mg total) daily by mouth. 12/21/16  Yes Hilty, Nadean Corwin, MD  nitroGLYCERIN (NITROSTAT) 0.4 MG SL tablet Place 1 tablet (0.4 mg total) under the tongue every 5 (five) minutes as needed for chest pain. 10/08/16   Pixie Casino, MD    Allergies as of 02/08/2017  . (No Known Allergies)    Family History  Problem Relation Age of Onset  . Heart disease Brother        x3  . Stroke Father     Social History   Socioeconomic History  . Marital status: Married    Spouse name: Not on file  . Number of children: 1  . Years of education: Not on file  . Highest education level: Not on file  Social Needs  . Financial resource strain: Not on file  . Food insecurity - worry: Not on file  . Food insecurity - inability: Not on file  . Transportation needs - medical: Not on file  . Transportation needs - non-medical: Not on file  Occupational History  .  Not on file  Tobacco Use  . Smoking status: Current Every Day Smoker    Packs/day: 0.10    Years: 40.00    Pack years: 4.00    Types: Cigarettes  . Smokeless tobacco: Never Used  . Tobacco comment: smokes 3-4 cigarettes daily ("it all depends") 10/27/13  Substance and Sexual Activity  . Alcohol use: No  . Drug use: No  . Sexual activity: Not on file  Other Topics Concern  . Not on file  Social History Narrative  . Not on file    Review of Systems: See HPI, otherwise negative ROS   Physical Exam: BP (!) 148/84   Pulse (!) 50   Temp 97.6 F  (36.4 C) (Oral)   Resp 16   Ht 6' (1.829 m)   Wt 170 lb (77.1 kg)   SpO2 98%   BMI 23.06 kg/m  General:   Alert,  pleasant and cooperative in NAD Head:  Normocephalic and atraumatic. Neck:  Supple; Lungs:  Clear throughout to auscultation.    Heart:  Regular rate and rhythm. Abdomen:  Soft, nontender and nondistended. Normal bowel sounds, without guarding, and without rebound.   Neurologic:  Alert and  oriented x4;  grossly normal neurologically.  Impression/Plan:     SCREENING  Plan:  1. TCS TODAY DISCUSSED PROCEDURE, BENEFITS, & RISKS: < 1% chance of medication reaction, bleeding, perforation, or rupture of spleen/liver.

## 2017-02-25 NOTE — Op Note (Signed)
[facility] Patient Name: Robert Shaw Procedure Date: 02/25/2017 9:48 AM MRN: [id number] Date of Birth: Dec 12, 1950 Attending MD: Barney Drain MD, MD CSN: [id number] Age: 67 Admit Type: Outpatient Procedure:                Colonoscopy WITH COLD SNARE POLYPECTOMY Indications:              Screening for colorectal malignant neoplasm Providers:                Barney Drain MD, MD, Lurline Del, RN, Nelma Rothman,                            Technician Referring MD:             Rosita Fire MD, MD Medicines:                Meperidine 50 mg IV, Midazolam 4 mg IV Complications:            No immediate complications. Estimated Blood Loss:     Estimated blood loss was minimal. Procedure:                Pre-Anesthesia Assessment:                           - Prior to the procedure, a History and Physical                            was performed, and patient medications and                            allergies were reviewed. The patient's tolerance of                            previous anesthesia was also reviewed. The risks                            and benefits of the procedure and the sedation                            options and risks were discussed with the patient.                            All questions were answered, and informed consent                            was obtained. Prior Anticoagulants: The patient has                            taken aspirin, last dose was 1 day prior to                            procedure. ASA Grade Assessment: II - A patient                            with mild systemic disease. After reviewing the  risks and benefits, the patient was deemed in                            satisfactory condition to undergo the procedure.                            After obtaining informed consent, the colonoscope                            was passed under direct vision. Throughout the                            procedure, the patient's blood  pressure, pulse, and                            oxygen saturations were monitored continuously. The                            EC-[id number] ([id number]) scope was introduced through                            the anus and advanced to the the cecum, identified                            by appendiceal orifice and ileocecal valve. The                            colonoscopy was somewhat difficult due to a                            tortuous colon. Successful completion of the                            procedure was aided by COLOWRAP. The patient                            tolerated the procedure well. The quality of the                            bowel preparation was excellent. The ileocecal                            valve, appendiceal orifice, and rectum were                            photographed. Scope In: 10:30:04 AM Scope Out: 10:54:09 AM Scope Withdrawal Time: 0 hours 21 minutes 23 seconds  Total Procedure Duration: 0 hours 24 minutes 5 seconds  Findings:      The recto-sigmoid colon and sigmoid colon were mildly redundant.      Ten sessile polyps were found in the descending colon(2), splenic       flexure, hepatic flexure, ascending colon(4) and cecum(2). The polyps       were 3 to 6 mm in size. These polyps were removed with  a cold snare.       Resection and retrieval were complete.      Multiple small and large-mouthed diverticula were found in the       recto-sigmoid colon, sigmoid colon, descending colon, ascending colon       and cecum.      External hemorrhoids were found during retroflexion. The hemorrhoids       were small. Impression:               - Redundant LEFT colon.                           - Ten 3 to 6 mm polyps in the descending colon, at                            the splenic flexure, at the hepatic flexure, in the                            ascending colon and in the cecum, removed with a                            cold snare. Resected and retrieved.                            - Diverticulosis in the recto-sigmoid colon, in the                            sigmoid colon, in the descending colon, in the                            ascending colon and in the cecum.                           - External hemorrhoids. Moderate Sedation:      Moderate (conscious) sedation was administered by the endoscopy nurse       and supervised by the endoscopist. The following parameters were       monitored: oxygen saturation, heart rate, blood pressure, and response       to care. Total physician intraservice time was 35 minutes. Recommendation:           - Repeat colonoscopy 1-3 YEARS for surveillance.                            ALL FIRST DEGREE RELATIVES NEED A COLONOSCOPY AT                            AGE 10.                           - Await pathology results.                           - High fiber diet.                           - Continue present medications.                           -  Patient has a contact number available for                            emergencies. The signs and symptoms of potential                            delayed complications were discussed with the                            patient. Return to normal activities tomorrow.                            Written discharge instructions were provided to the                            patient. Procedure Code(s):        --- Professional ---                           [phone], Colonoscopy, flexible; with removal of                            tumor(s), polyp(s), or other lesion(s) by snare                            technique                           99152, Moderate sedation services provided by the                            same physician or other qualified health care                            professional performing the diagnostic or                            therapeutic service that the sedation supports,                            requiring the presence of an independent trained                             observer to assist in the monitoring of the                            patient's level of consciousness and physiological                            status; initial 15 minutes of intraservice time,                            patient age 60 years or older  [address], Moderate sedation services; each additional                            15 minutes intraservice time Diagnosis Code(s):        --- Professional ---                           Z12.11, Encounter for screening for malignant                            neoplasm of colon                           D12.4, Benign neoplasm of descending colon                           D12.3, Benign neoplasm of transverse colon (hepatic                            flexure or splenic flexure)                           D12.2, Benign neoplasm of ascending colon                           D12.0, Benign neoplasm of cecum                           K64.4, Residual hemorrhoidal skin tags                           K57.30, Diverticulosis of large intestine without                            perforation or abscess without bleeding                           Q43.8, Other specified congenital malformations of                            intestine CPT copyright 2016 [employer]. All rights reserved. The codes documented in this report are preliminary and upon coder review may  be revised to meet current compliance requirements. Barney Drain, MD Barney Drain MD, MD 02/25/2017 11:09:03 AM This report has been signed electronically. Number of Addenda: 0

## 2017-02-27 ENCOUNTER — Telehealth: Payer: Self-pay | Admitting: Gastroenterology

## 2017-02-27 NOTE — Telephone Encounter (Signed)
Please call pt. HE had NINE simple adenomas  AND ONE POLYPOID LESION removed    DRINK WATER TO KEEP YOUR URINE LIGHT YELLOW.  FOLLOW A HIGH FIBER DIET. AVOID ITEMS THAT CAUSE BLOATING.  USE PREPARATION H FOUR TIMES  A DAY IF NEEDED TO RELIEVE RECTAL PAIN/PRESSURE/BLEEDING. Next colonoscopy in 3 years. YOUR SISTERS, BROTHERS, CHILDREN, AND PARENTS NEED TO HAVE A COLONOSCOPY STARTING AT THE AGE OF 40.

## 2017-02-27 NOTE — Telephone Encounter (Signed)
LMOM to call.

## 2017-02-27 NOTE — Telephone Encounter (Signed)
Pt is aware.  

## 2017-02-28 ENCOUNTER — Encounter (HOSPITAL_COMMUNITY): Payer: Self-pay | Admitting: Gastroenterology

## 2017-03-01 NOTE — Telephone Encounter (Signed)
Reminder in epic °

## 2017-06-24 DIAGNOSIS — I2510 Atherosclerotic heart disease of native coronary artery without angina pectoris: Secondary | ICD-10-CM | POA: Diagnosis not present

## 2017-06-24 DIAGNOSIS — J410 Simple chronic bronchitis: Secondary | ICD-10-CM | POA: Diagnosis not present

## 2017-06-24 DIAGNOSIS — I10 Essential (primary) hypertension: Secondary | ICD-10-CM | POA: Diagnosis not present

## 2017-08-08 DIAGNOSIS — S2341XA Sprain of ribs, initial encounter: Secondary | ICD-10-CM | POA: Diagnosis not present

## 2017-08-08 DIAGNOSIS — I10 Essential (primary) hypertension: Secondary | ICD-10-CM | POA: Diagnosis not present

## 2017-08-08 DIAGNOSIS — I2510 Atherosclerotic heart disease of native coronary artery without angina pectoris: Secondary | ICD-10-CM | POA: Diagnosis not present

## 2017-10-31 ENCOUNTER — Encounter: Payer: Self-pay | Admitting: Internal Medicine

## 2017-10-31 ENCOUNTER — Ambulatory Visit: Payer: Medicare Other | Admitting: Internal Medicine

## 2017-10-31 VITALS — BP 100/72 | HR 53 | Ht 72.0 in | Wt 165.0 lb

## 2017-10-31 DIAGNOSIS — F17200 Nicotine dependence, unspecified, uncomplicated: Secondary | ICD-10-CM

## 2017-10-31 DIAGNOSIS — Z79899 Other long term (current) drug therapy: Secondary | ICD-10-CM

## 2017-10-31 DIAGNOSIS — E785 Hyperlipidemia, unspecified: Secondary | ICD-10-CM | POA: Diagnosis not present

## 2017-10-31 DIAGNOSIS — I2510 Atherosclerotic heart disease of native coronary artery without angina pectoris: Secondary | ICD-10-CM | POA: Diagnosis not present

## 2017-10-31 LAB — LIPID PANEL
Chol/HDL Ratio: 3.1 ratio (ref 0.0–5.0)
Cholesterol, Total: 103 mg/dL (ref 100–199)
HDL: 33 mg/dL — ABNORMAL LOW (ref >39)
LDL Calculated: 54 mg/dL (ref 0–99)
TRIGLYCERIDES: 80 mg/dL (ref 0–149)
VLDL Cholesterol Cal: 16 mg/dL (ref 5–40)

## 2017-10-31 NOTE — Patient Instructions (Signed)
Medication Instructions:  Your physician recommends that you continue on your current medications as directed. Please refer to the Current Medication list given to you today.  Labwork: Today (Lipid)  Follow-Up: Your physician wants you to follow-up in: 1 year with Dr. Debara Pickett. You will receive a reminder letter in the mail two months in advance. If you don't receive a letter, please call our office to schedule the follow-up appointment.   Any Other Special Instructions Will Be Listed Below (If Applicable).     If you need a refill on your cardiac medications before your next appointment, please call your pharmacy.

## 2017-10-31 NOTE — Progress Notes (Signed)
OFFICE NOTE  Chief Complaint:  No complaints  Primary Care Physician: Rosita Fire, MD  HPI:  Robert Shaw is a 67 year old gentleman whom I have been following for a history of coronary disease. He had a stent in his circumflex and diagonal in 2003 and had moderate disease in the LAD and the right coronary. He continues to smoke, which I have talked to him about as a marked risk factor, and he has been working on some exercise. Unfortunately, on January 31 he had an episode when he developed chest pain, somewhat after eating with some shortness of breath, that went to both arms. He took some aspirin at home and the pain did not go away. He, therefore, presented to the emergency department. He was found to have an elevated troponin of 0.72 and underwent cardiac catheterization. That catheterization, performed by me, showed a severe discrete 95% stenosis in the mid LAD. Both the stents in the diagonal and circumflex were patent with minimal in-stent restenosis. The EF was greater than 60% with no focal wall motion abnormalities. The patient then underwent a Xience drug-eluting stent placement to the mid LAD on March 17, 2012, by Dr. Claiborne Billings. This was successful at reducing the lesion to 0% stenosis.  Since his hospitalization he has done very well. He has had no further chest pain. He was switched successfully from Plavix over to Brilinta, which he is taking with low-dose aspirin. His only complaint now is that he is having some numbness and 2 toes on his right foot. He reports he had problems with his right leg after the catheterization saying that he developed a foot drop but that has progressively gotten better over the past year.  He had no hematoma, bruit or catheterization complications that we were aware of at the time. Unfortunately, he continues to smoke, which is an ongoing risk factor.  Mr. Nickless returns today for followup. He is doing well denies any chest pain or worsening  shortness of breath. He continues on aspirin and Brillinta. During his last office visit we had recommended starting Chantix for smoking cessation however he has not started that medication. He continues to smoke several cigarettes a day. He has no good reason for why he did not start the medication. He is also anticipating undergoing a switched to Medicare this year. Recent laboratory work was performed by his primary care provider which showed an impaired fasting glucose at 101 and hemoglobin A1c of 6.1 suggesting he may be prediabetic. His creatinine is 1.4. Lipid profile demonstrates total cholesterol 107, HDL 37, triglycerides 95 and LDL 51.  I saw Mr. Villard back in the office today. He is currently without any complaints. He denies any chest pain or worsening shortness of breath. Unfortunately has not been able to stop smoking but he is down to about 2 cigarettes a day. As mentioned above his last PCI was in February 2014. He remains on dual antiplatelets therapy with Brilinta, this was for non-STEMI. The data with long-term therapy of Brilinta does not go out much past 2 years therefore we may consider having him come off of this medication.  09/14/2015  Mr. Dai returns today for follow-up. He is without complaints. Overall he is doing really well. Denies any shortness of breath or chest pain. He remains physically active. He had recent cholesterol testing through his primary care provider which showed LDL less than 70. He is on Crestor 10 mg daily. Blood pressure is at goal.  10/08/2016  Mr. Hortman was seen today in follow-up. This is an annual visit. Overall he is asymptomatic. He denies any chest pain or worsening shortness of breath. 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 He is a blood pressure is well-controlled today. He has not had a recent lipid check. His EKG was personally reviewed and shows stable anterolateral T-wave inversions in V3 through V6 as well as high lateral leads of  1 and aVL. He's not had any coronary events that were aware of since 2014.  10/31/2017  [name] returns for annual visit.  Overall continues to do well.  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  Denies any chest pain or worsening shortness of breath.  He is maintained on low-dose aspirin, metoprolol and Crestor.  He is due for repeat lipid profile as his last study was a year ago.  He has not had any recent testing.  EKG personally reviewed demonstrates stable lateral T wave inversions.  PMHx:  Past Medical History:  Diagnosis Date  . 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 Coronary artery disease   . Dyslipidemia   . Family history of heart disease   . History of nuclear stress test 07/13/2008   dipyridamole; normal perfusion, no ischemia, low risk   . Hypertension   . NSTEMI (non-ST elevated myocardial infarction) ([place])    h/o  . Tobacco abuse     Past Surgical History:  Procedure Laterality Date  . CARDIAC CATHETERIZATION  04/2001   after high lateral MI, preserved LV function w/mild mid anterolateral hypokinesia, diffuse coronary irregularity with signficiant obstruction in LAD, OM1, superior branch of ramus (Dr. Margo Aye - [facility])  . CARDIAC CATHETERIZATION  09/23/2001   patent stents from 06/2001 (Dr. Jackie Plum)  . CARDIAC CATHETERIZATION  09/17/2002   no restenosis at prior intervention sites (Dr. Corky Downs)  . CARDIAC CATHETERIZATION  09/09/2009   no significant CAD, patent stents in OM1 & LAD (Dr. Corky Downs)   . COLONOSCOPY N/A 02/25/2017   Procedure: COLONOSCOPY;  Surgeon: Danie Binder, MD;  [facility];  Service: Endoscopy;  Laterality: N/A;  10:30 Am  . CORONARY ANGIOPLASTY WITH STENT PLACEMENT  06/19/2001   Cfx (3.5x86mm Zeta stent) & diagonal stenting (2.5x32mm pixel stent) w/moderate LAD disease (Dr. Corky Downs)   . CORONARY ANGIOPLASTY WITH STENT PLACEMENT  03/17/2012   Xience DES (2.25x50mm) of mid-distal  LAD (Dr. Corky Downs)  . LEFT HEART CATHETERIZATION WITH CORONARY ANGIOGRAM N/A 03/17/2012   Procedure: LEFT HEART CATHETERIZATION WITH CORONARY ANGIOGRAM;  Surgeon: Pixie Casino, MD;  Location: [facility] CATH LAB;  Service: Cardiovascular;  Laterality: N/A;  . PERCUTANEOUS CORONARY STENT INTERVENTION (PCI-S)  03/17/2012   Procedure: PERCUTANEOUS CORONARY STENT INTERVENTION (PCI-S);  Surgeon: Pixie Casino, MD;  Location: [facility] CATH LAB;  Service: Cardiovascular;;  . POLYPECTOMY  02/25/2017   Procedure: POLYPECTOMY;  Surgeon: Danie Binder, MD;  [facility];  Service: Endoscopy;;  cecal x2; ascending x4;hepatic flexure;splenic flexure; descending x2;  . TRANSTHORACIC ECHOCARDIOGRAM  03/29/2006   EF normal; mild MR & TR; AV mildly sclerotic    FAMHx:  Family History  Problem Relation Age of Onset  . Heart disease Brother        x3  . Stroke Father     SOCHx:   reports that he has been smoking cigarettes. He has a 4.00 pack-year smoking history. He has never used smokeless tobacco. He  reports that he does not drink alcohol or use drugs.  ALLERGIES:  No Known Allergies  ROS: Pertinent items noted in HPI and remainder of comprehensive ROS otherwise negative.  HOME MEDS: Current Outpatient Medications  Medication Sig Dispense Refill  . aspirin EC 81 MG tablet Take 81 mg by mouth daily.    Marland Kitchen loratadine (CLARITIN) 10 MG tablet Take 10 mg by mouth daily.    . metoprolol tartrate (LOPRESSOR) 25 MG tablet Take 12.5 mg by mouth every evening.    . nitroGLYCERIN (NITROSTAT) 0.4 MG SL tablet Place 1 tablet (0.4 mg total) under the tongue every 5 (five) minutes as needed for chest pain. 25 tablet 2  . rosuvastatin (CRESTOR) 10 MG tablet Take 1 tablet (10 mg total) daily by mouth. 90 tablet 3   No current facility-administered medications for this visit.     LABS/IMAGING: No results found for this or any previous visit (from the past 48 hour(s)). No results found.  VITALS: BP 100/72  (BP Location: Left Arm, Patient Position: Sitting, Cuff Size: Normal)   Pulse (!) 53   Ht 6' (1.829 m)   Wt 165 lb (74.8 kg)   BMI 22.38 kg/m   EXAM: General appearance: alert and no distress Neck: no carotid bruit and no JVD Lungs: clear to auscultation bilaterally Heart: regular rate and rhythm, S1, S2 normal, no murmur, click, rub or gallop Abdomen: soft, non-tender; bowel sounds normal; no masses,  no organomegaly Extremities: extremities normal, atraumatic, no cyanosis or edema Pulses: 2+ and symmetric Skin: Skin color, texture, turgor normal. No rashes or lesions Neurologic: Grossly normal Psych: Mood, affect normal  EKG: Sinus bradycardia 53, possible left atrial enlargement, anterolateral ST and T wave changes (stable)-personally reviewed  ASSESSMENT: 1. Coronary artery disease status post PCI to the mid to distal LAD with a Xience DES (03/2012) 2. Dyslipidemia - at goal 3. Hypertension - controlled 4. Tobacco dependence - has Chantix, not ready to quit 5. Anterolateral and inferior T-wave abnormalities-stable  PLAN: 1.   Mr. Gracia continues to do well now 5 years after stenting to the mid to distal LAD.  His cholesterol has been well controlled but is due for repeat assessment.  We will order that today.  Blood pressure is well controlled.  His EKG is unchanged although shows anterolateral T wave abnormalities.  Unfortunately continues to smoke and I have encouraged him to work on cessation.  Follow-up annually or sooner as necessary.  Pixie Casino, MD, [facility], [place] Director of the Advanced Lipid Disorders &  Cardiovascular Risk Reduction Clinic Diplomate of the American Board of Clinical Lipidology Attending Cardiologist  Direct Dial: [phone]  Fax: [phone]  Website:  [email or web address] 10/31/2017, 8:55 AM

## 2017-11-04 ENCOUNTER — Encounter: Payer: Self-pay | Admitting: Internal Medicine

## 2017-12-23 DIAGNOSIS — R739 Hyperglycemia, unspecified: Secondary | ICD-10-CM | POA: Diagnosis not present

## 2017-12-23 DIAGNOSIS — I2510 Atherosclerotic heart disease of native coronary artery without angina pectoris: Secondary | ICD-10-CM | POA: Diagnosis not present

## 2017-12-23 DIAGNOSIS — I499 Cardiac arrhythmia, unspecified: Secondary | ICD-10-CM | POA: Diagnosis not present

## 2017-12-23 DIAGNOSIS — I509 Heart failure, unspecified: Secondary | ICD-10-CM | POA: Diagnosis not present

## 2017-12-23 DIAGNOSIS — E7849 Other hyperlipidemia: Secondary | ICD-10-CM | POA: Diagnosis not present

## 2017-12-23 DIAGNOSIS — E785 Hyperlipidemia, unspecified: Secondary | ICD-10-CM | POA: Diagnosis not present

## 2017-12-23 DIAGNOSIS — I10 Essential (primary) hypertension: Secondary | ICD-10-CM | POA: Diagnosis not present

## 2017-12-23 DIAGNOSIS — Z1389 Encounter for screening for other disorder: Secondary | ICD-10-CM | POA: Diagnosis not present

## 2017-12-23 DIAGNOSIS — Z0001 Encounter for general adult medical examination with abnormal findings: Secondary | ICD-10-CM | POA: Diagnosis not present

## 2017-12-23 DIAGNOSIS — Z23 Encounter for immunization: Secondary | ICD-10-CM | POA: Diagnosis not present

## 2018-02-14 ENCOUNTER — Other Ambulatory Visit: Payer: Self-pay | Admitting: Internal Medicine

## 2018-02-14 NOTE — Telephone Encounter (Signed)
Refill sent to the pharmacy electronically.  

## 2018-05-20 ENCOUNTER — Other Ambulatory Visit: Payer: Self-pay | Admitting: Internal Medicine

## 2018-05-20 NOTE — Telephone Encounter (Signed)
Lopressor and rosuvastatin refilled.

## 2018-06-16 DIAGNOSIS — I10 Essential (primary) hypertension: Secondary | ICD-10-CM | POA: Diagnosis not present

## 2018-06-16 DIAGNOSIS — I2510 Atherosclerotic heart disease of native coronary artery without angina pectoris: Secondary | ICD-10-CM | POA: Diagnosis not present

## 2018-08-28 DIAGNOSIS — Z0001 Encounter for general adult medical examination with abnormal findings: Secondary | ICD-10-CM | POA: Diagnosis not present

## 2018-08-28 DIAGNOSIS — I10 Essential (primary) hypertension: Secondary | ICD-10-CM | POA: Diagnosis not present

## 2018-08-28 DIAGNOSIS — I2510 Atherosclerotic heart disease of native coronary artery without angina pectoris: Secondary | ICD-10-CM | POA: Diagnosis not present

## 2018-08-28 DIAGNOSIS — Z1389 Encounter for screening for other disorder: Secondary | ICD-10-CM | POA: Diagnosis not present

## 2018-08-28 DIAGNOSIS — E7849 Other hyperlipidemia: Secondary | ICD-10-CM | POA: Diagnosis not present

## 2018-09-28 DIAGNOSIS — I10 Essential (primary) hypertension: Secondary | ICD-10-CM | POA: Diagnosis not present

## 2018-09-28 DIAGNOSIS — I2510 Atherosclerotic heart disease of native coronary artery without angina pectoris: Secondary | ICD-10-CM | POA: Diagnosis not present

## 2018-10-22 ENCOUNTER — Telehealth: Payer: Self-pay | Admitting: Internal Medicine

## 2018-10-22 NOTE — Telephone Encounter (Signed)
Spoke with wife as she and patient had 10/28/18 appointments with Dr. Debara Pickett. She is aware this is a virtual clinic and that they can be r/s for in office together on 12/19/2018. She will discuss with husband and decide if they want to keep appts and do a virtual visit or r/s

## 2018-10-23 DIAGNOSIS — E7849 Other hyperlipidemia: Secondary | ICD-10-CM | POA: Diagnosis not present

## 2018-10-23 DIAGNOSIS — I10 Essential (primary) hypertension: Secondary | ICD-10-CM | POA: Diagnosis not present

## 2018-10-23 DIAGNOSIS — Z0001 Encounter for general adult medical examination with abnormal findings: Secondary | ICD-10-CM | POA: Diagnosis not present

## 2018-10-28 ENCOUNTER — Encounter: Payer: Self-pay | Admitting: Internal Medicine

## 2018-10-28 ENCOUNTER — Telehealth (INDEPENDENT_AMBULATORY_CARE_PROVIDER_SITE_OTHER): Payer: Medicare Other | Admitting: Internal Medicine

## 2018-10-28 VITALS — BP 151/77

## 2018-10-28 DIAGNOSIS — F17200 Nicotine dependence, unspecified, uncomplicated: Secondary | ICD-10-CM | POA: Diagnosis not present

## 2018-10-28 DIAGNOSIS — I10 Essential (primary) hypertension: Secondary | ICD-10-CM

## 2018-10-28 DIAGNOSIS — E785 Hyperlipidemia, unspecified: Secondary | ICD-10-CM | POA: Diagnosis not present

## 2018-10-28 DIAGNOSIS — I2510 Atherosclerotic heart disease of native coronary artery without angina pectoris: Secondary | ICD-10-CM | POA: Diagnosis not present

## 2018-10-28 NOTE — Progress Notes (Signed)
Virtual Visit via Telephone Note   This visit type was conducted due to national recommendations for restrictions regarding the COVID-19 Pandemic (e.g. social distancing) in an effort to limit this patient's exposure and mitigate transmission in our community.  Due to his co-morbid illnesses, this patient is at least at moderate risk for complications without adequate follow up.  This format is felt to be most appropriate for this patient at this time.  The patient did not have access to video technology/had technical difficulties with video requiring transitioning to audio format only (telephone).  All issues noted in this document were discussed and addressed.  No physical exam could be performed with this format.  Please refer to the patient's chart for his  consent to telehealth for [facility].   Evaluation Performed:  Telephone follow-up  Date:  10/28/2018   ID:  Robert Shaw, DOB 31-Dec-1950, MRN [id number]  Patient Location:  [address] [place] [address]  Provider location:   [address] [place], [place] [address]  PCP:  Rosita Fire, MD  Cardiologist:  No primary care provider on file. Electrophysiologist:  None   Chief Complaint:  No complaints  History of Present Illness:    Robert Shaw is a 68 y.o. male who presents via audio/video conferencing for a telehealth visit today.  Robert Shaw is seen today for follow-up of coronary disease.  Overall he feels like he is doing well.  Denies any chest pain or worsening shortness of breath.  Blood pressure was elevated somewhat today however he says is generally well controlled.  Usually takes his medications at night.  Unfortunately continues to smoke.  Is not been successful quitting yet.  I have encouraged him to back off on smoking.  His cholesterol was recently assessed and seems to be well controlled.  Total cholesterol 113, LDL of 60, HDL 37 and triglycerides of 76.  Hemoglobin A1c was 5.7.  Serum  creatinine is 1.36.  The patient does not have symptoms concerning for COVID-19 infection (fever, chills, cough, or new SHORTNESS OF BREATH).    Prior CV studies:   The following studies were reviewed today:  Chart reviewed Lab work  PMHx:  Past Medical History:  Diagnosis Date  . 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 Coronary artery disease   . Dyslipidemia   . Family history of heart disease   . History of nuclear stress test 07/13/2008   dipyridamole; normal perfusion, no ischemia, low risk   . Hypertension   . NSTEMI (non-ST elevated myocardial infarction) ([place])    h/o  . Tobacco abuse     Past Surgical History:  Procedure Laterality Date  . CARDIAC CATHETERIZATION  04/2001   after high lateral MI, preserved LV function w/mild mid anterolateral hypokinesia, diffuse coronary irregularity with signficiant obstruction in LAD, OM1, superior branch of ramus (Dr. Margo Aye - [facility])  . CARDIAC CATHETERIZATION  09/23/2001   patent stents from 06/2001 (Dr. Jackie Plum)  . CARDIAC CATHETERIZATION  09/17/2002   no restenosis at prior intervention sites (Dr. Corky Downs)  . CARDIAC CATHETERIZATION  09/09/2009   no significant CAD, patent stents in OM1 & LAD (Dr. Corky Downs)   . COLONOSCOPY N/A 02/25/2017   Procedure: COLONOSCOPY;  Surgeon: Danie Binder, MD;  [facility];  Service: Endoscopy;  Laterality: N/A;  10:30 Am  . CORONARY ANGIOPLASTY WITH STENT PLACEMENT  06/19/2001   Cfx (3.5x61mm Zeta stent) & diagonal stenting (2.5x5mm pixel stent)  w/moderate LAD disease (Dr. Corky Downs)   . CORONARY ANGIOPLASTY WITH STENT PLACEMENT  03/17/2012   Xience DES (2.25x36mm) of mid-distal LAD (Dr. Corky Downs)  . LEFT HEART CATHETERIZATION WITH CORONARY ANGIOGRAM N/A 03/17/2012   Procedure: LEFT HEART CATHETERIZATION WITH CORONARY ANGIOGRAM;  Surgeon: Pixie Casino, MD;  Location: [facility] CATH LAB;  Service: Cardiovascular;  Laterality: N/A;  . PERCUTANEOUS CORONARY STENT  INTERVENTION (PCI-S)  03/17/2012   Procedure: PERCUTANEOUS CORONARY STENT INTERVENTION (PCI-S);  Surgeon: Pixie Casino, MD;  Location: [facility] CATH LAB;  Service: Cardiovascular;;  . POLYPECTOMY  02/25/2017   Procedure: POLYPECTOMY;  Surgeon: Danie Binder, MD;  [facility];  Service: Endoscopy;;  cecal x2; ascending x4;hepatic flexure;splenic flexure; descending x2;  . TRANSTHORACIC ECHOCARDIOGRAM  03/29/2006   EF normal; mild MR & TR; AV mildly sclerotic    FAMHx:  Family History  Problem Relation Age of Onset  . Heart disease Brother        x3  . Stroke Father     SOCHx:   reports that he has been smoking cigarettes. He has a 4.00 pack-year smoking history. He has never used smokeless tobacco. He reports that he does not drink alcohol or use drugs.  ALLERGIES:  No Known Allergies  MEDS:  Current Meds  Medication Sig  . aspirin EC 81 MG tablet Take 81 mg by mouth daily.  Marland Kitchen loratadine (CLARITIN) 10 MG tablet Take 10 mg by mouth daily.  . metoprolol tartrate (LOPRESSOR) 25 MG tablet Take 1/2 (one-half) tablet by mouth twice daily  . nitroGLYCERIN (NITROSTAT) 0.4 MG SL tablet Place 1 tablet (0.4 mg total) under the tongue every 5 (five) minutes as needed for chest pain.  . rosuvastatin (CRESTOR) 10 MG tablet Take 1 tablet by mouth once daily     ROS: Pertinent items noted in HPI and remainder of comprehensive ROS otherwise negative.  Labs/Other Tests and Data Reviewed:    Recent Labs: No results found for requested labs within last 8760 hours.   Recent Lipid Panel Lab Results  Component Value Date/Time   CHOL 103 10/31/2017 09:09 AM   TRIG 80 10/31/2017 09:09 AM   HDL 33 (L) 10/31/2017 09:09 AM   CHOLHDL 3.1 10/31/2017 09:09 AM   CHOLHDL 3.4 09/14/2014 08:23 AM   LDLCALC 54 10/31/2017 09:09 AM    Wt Readings from Last 3 Encounters:  10/31/17 165 lb (74.8 kg)  02/25/17 170 lb (77.1 kg)  10/08/16 160 lb (72.6 kg)     Exam:    Vital Signs:  BP (!)  151/77    Exam not performed due to telephone visit  ASSESSMENT & PLAN:    1. Coronary artery disease status post PCI to the mid to distal LAD with a Xience DES (03/2012) 2. Dyslipidemia 3. Hypertension 4. Tobacco dependence - not ready to quit  Mr. Blessinger is doing well with reasonably controlled blood pressure although elevated somewhat today.  Unfortunately he has not been able to quit smoking.  He denies any recurrent chest pain.  His cholesterol is at goal.  I have encouraged continued exercise and smoking cessation going forward.  Follow-up with me annually or sooner as necessary.  COVID-19 Education: The signs and symptoms of COVID-19 were discussed with the patient and how to seek care for testing (follow up with PCP or arrange E-visit).  The importance of social distancing was discussed today.  Patient Risk:   After full review of this patients clinical status, I feel that  they are at least moderate risk at this time.  Time:   Today, I have spent 25 minutes with the patient with telehealth technology discussing dyslipidemia, tobacco abuse, coronary artery disease, hypertension.     Medication Adjustments/Labs and Tests Ordered: Current medicines are reviewed at length with the patient today.  Concerns regarding medicines are outlined above.   Tests Ordered: No orders of the defined types were placed in this encounter.   Medication Changes: No orders of the defined types were placed in this encounter.   Disposition:  in 1 year(s)  Pixie Casino, MD, [facility], [place] Director of the Advanced Lipid Disorders &  Cardiovascular Risk Reduction Clinic Diplomate of the American Board of Clinical Lipidology Attending Cardiologist  Direct Dial: [phone]  Fax: [phone]  Website:  [email or web address]  Pixie Casino, MD  10/28/2018 8:57 AM

## 2018-10-28 NOTE — Patient Instructions (Signed)
Medication Instructions:  Your physician recommends that you continue on your current medications as directed. Please refer to the Current Medication list given to you today.  If you need a refill on your cardiac medications before your next appointment, please call your pharmacy.    Follow-Up: At [facility], you and your health needs are our priority.  As part of our continuing mission to provide you with exceptional heart care, we have created designated Provider Care Teams.  These Care Teams include your primary Cardiologist (physician) and Advanced Practice Providers (APPs -  Physician Assistants and Nurse Practitioners) who all work together to provide you with the care you need, when you need it. You will need a follow up appointment in 12 months.  Please call our office 2 months in advance to schedule this appointment.  You may see Dr. Hilty or one of the following Advanced Practice Providers on your designated Care Team: Hao Meng, PA-C . Angela Duke, PA-C  Any Other Special Instructions Will Be Listed Below (If Applicable).    

## 2018-10-29 DIAGNOSIS — I2510 Atherosclerotic heart disease of native coronary artery without angina pectoris: Secondary | ICD-10-CM | POA: Diagnosis not present

## 2018-10-29 DIAGNOSIS — I10 Essential (primary) hypertension: Secondary | ICD-10-CM | POA: Diagnosis not present

## 2018-11-25 DIAGNOSIS — I2510 Atherosclerotic heart disease of native coronary artery without angina pectoris: Secondary | ICD-10-CM | POA: Diagnosis not present

## 2018-11-25 DIAGNOSIS — I10 Essential (primary) hypertension: Secondary | ICD-10-CM | POA: Diagnosis not present

## 2018-12-26 DIAGNOSIS — I2510 Atherosclerotic heart disease of native coronary artery without angina pectoris: Secondary | ICD-10-CM | POA: Diagnosis not present

## 2018-12-26 DIAGNOSIS — I10 Essential (primary) hypertension: Secondary | ICD-10-CM | POA: Diagnosis not present

## 2019-01-22 DIAGNOSIS — M1990 Unspecified osteoarthritis, unspecified site: Secondary | ICD-10-CM | POA: Diagnosis not present

## 2019-01-22 DIAGNOSIS — I2510 Atherosclerotic heart disease of native coronary artery without angina pectoris: Secondary | ICD-10-CM | POA: Diagnosis not present

## 2019-01-22 DIAGNOSIS — I10 Essential (primary) hypertension: Secondary | ICD-10-CM | POA: Diagnosis not present

## 2019-01-22 DIAGNOSIS — Z23 Encounter for immunization: Secondary | ICD-10-CM | POA: Diagnosis not present

## 2019-02-22 DIAGNOSIS — I2510 Atherosclerotic heart disease of native coronary artery without angina pectoris: Secondary | ICD-10-CM | POA: Diagnosis not present

## 2019-02-22 DIAGNOSIS — I10 Essential (primary) hypertension: Secondary | ICD-10-CM | POA: Diagnosis not present

## 2019-03-25 DIAGNOSIS — I2510 Atherosclerotic heart disease of native coronary artery without angina pectoris: Secondary | ICD-10-CM | POA: Diagnosis not present

## 2019-03-25 DIAGNOSIS — I10 Essential (primary) hypertension: Secondary | ICD-10-CM | POA: Diagnosis not present

## 2019-04-22 DIAGNOSIS — I2510 Atherosclerotic heart disease of native coronary artery without angina pectoris: Secondary | ICD-10-CM | POA: Diagnosis not present

## 2019-04-22 DIAGNOSIS — I10 Essential (primary) hypertension: Secondary | ICD-10-CM | POA: Diagnosis not present

## 2019-05-23 DIAGNOSIS — I2510 Atherosclerotic heart disease of native coronary artery without angina pectoris: Secondary | ICD-10-CM | POA: Diagnosis not present

## 2019-05-23 DIAGNOSIS — I10 Essential (primary) hypertension: Secondary | ICD-10-CM | POA: Diagnosis not present

## 2019-06-22 DIAGNOSIS — F17210 Nicotine dependence, cigarettes, uncomplicated: Secondary | ICD-10-CM | POA: Diagnosis not present

## 2019-06-22 DIAGNOSIS — Z0001 Encounter for general adult medical examination with abnormal findings: Secondary | ICD-10-CM | POA: Diagnosis not present

## 2019-06-22 DIAGNOSIS — Z79899 Other long term (current) drug therapy: Secondary | ICD-10-CM | POA: Diagnosis not present

## 2019-06-22 DIAGNOSIS — E7849 Other hyperlipidemia: Secondary | ICD-10-CM | POA: Diagnosis not present

## 2019-06-22 DIAGNOSIS — Z1389 Encounter for screening for other disorder: Secondary | ICD-10-CM | POA: Diagnosis not present

## 2019-06-22 DIAGNOSIS — E785 Hyperlipidemia, unspecified: Secondary | ICD-10-CM | POA: Diagnosis not present

## 2019-06-22 DIAGNOSIS — I10 Essential (primary) hypertension: Secondary | ICD-10-CM | POA: Diagnosis not present

## 2019-06-22 DIAGNOSIS — I2510 Atherosclerotic heart disease of native coronary artery without angina pectoris: Secondary | ICD-10-CM | POA: Diagnosis not present

## 2019-06-23 ENCOUNTER — Other Ambulatory Visit: Payer: Self-pay | Admitting: Internal Medicine

## 2019-06-23 ENCOUNTER — Other Ambulatory Visit (HOSPITAL_COMMUNITY): Payer: Self-pay | Admitting: Internal Medicine

## 2019-06-23 DIAGNOSIS — R05 Cough: Secondary | ICD-10-CM

## 2019-06-23 DIAGNOSIS — F17200 Nicotine dependence, unspecified, uncomplicated: Secondary | ICD-10-CM

## 2019-06-23 DIAGNOSIS — R059 Cough, unspecified: Secondary | ICD-10-CM

## 2019-07-03 ENCOUNTER — Ambulatory Visit (HOSPITAL_COMMUNITY)
Admission: RE | Admit: 2019-07-03 | Discharge: 2019-07-03 | Disposition: A | Discharge status: Home / Self Care | Payer: Medicare Other | Source: Ambulatory Visit | Attending: Internal Medicine | Admitting: Internal Medicine

## 2019-07-03 ENCOUNTER — Other Ambulatory Visit: Payer: Self-pay

## 2019-07-03 DIAGNOSIS — R05 Cough: Secondary | ICD-10-CM | POA: Insufficient documentation

## 2019-07-03 DIAGNOSIS — J439 Emphysema, unspecified: Secondary | ICD-10-CM | POA: Insufficient documentation

## 2019-07-03 DIAGNOSIS — I700 Atherosclerosis of aorta: Secondary | ICD-10-CM | POA: Insufficient documentation

## 2019-07-03 DIAGNOSIS — F17210 Nicotine dependence, cigarettes, uncomplicated: Secondary | ICD-10-CM | POA: Insufficient documentation

## 2019-07-03 DIAGNOSIS — F17200 Nicotine dependence, unspecified, uncomplicated: Secondary | ICD-10-CM

## 2019-07-03 DIAGNOSIS — I2510 Atherosclerotic heart disease of native coronary artery without angina pectoris: Secondary | ICD-10-CM | POA: Insufficient documentation

## 2019-07-03 DIAGNOSIS — R918 Other nonspecific abnormal finding of lung field: Secondary | ICD-10-CM | POA: Insufficient documentation

## 2019-07-03 DIAGNOSIS — Z122 Encounter for screening for malignant neoplasm of respiratory organs: Secondary | ICD-10-CM | POA: Diagnosis not present

## 2019-07-03 DIAGNOSIS — R059 Cough, unspecified: Secondary | ICD-10-CM

## 2019-07-16 DIAGNOSIS — J189 Pneumonia, unspecified organism: Secondary | ICD-10-CM | POA: Diagnosis not present

## 2019-07-16 DIAGNOSIS — J449 Chronic obstructive pulmonary disease, unspecified: Secondary | ICD-10-CM | POA: Diagnosis not present

## 2019-07-17 ENCOUNTER — Other Ambulatory Visit (HOSPITAL_COMMUNITY): Payer: Self-pay | Admitting: Internal Medicine

## 2019-07-17 DIAGNOSIS — R9389 Abnormal findings on diagnostic imaging of other specified body structures: Secondary | ICD-10-CM

## 2019-08-05 ENCOUNTER — Other Ambulatory Visit: Payer: Self-pay

## 2019-08-05 ENCOUNTER — Ambulatory Visit (HOSPITAL_COMMUNITY)
Admission: RE | Admit: 2019-08-05 | Discharge: 2019-08-05 | Disposition: A | Discharge status: Home / Self Care | Payer: Medicare Other | Source: Ambulatory Visit | Attending: Internal Medicine | Admitting: Internal Medicine

## 2019-08-05 DIAGNOSIS — R911 Solitary pulmonary nodule: Secondary | ICD-10-CM | POA: Insufficient documentation

## 2019-08-05 DIAGNOSIS — I2510 Atherosclerotic heart disease of native coronary artery without angina pectoris: Secondary | ICD-10-CM | POA: Diagnosis not present

## 2019-08-05 DIAGNOSIS — J432 Centrilobular emphysema: Secondary | ICD-10-CM | POA: Diagnosis not present

## 2019-08-05 DIAGNOSIS — R9389 Abnormal findings on diagnostic imaging of other specified body structures: Secondary | ICD-10-CM

## 2019-08-05 DIAGNOSIS — Z122 Encounter for screening for malignant neoplasm of respiratory organs: Secondary | ICD-10-CM | POA: Diagnosis not present

## 2019-08-05 DIAGNOSIS — F17210 Nicotine dependence, cigarettes, uncomplicated: Secondary | ICD-10-CM | POA: Insufficient documentation

## 2019-08-05 DIAGNOSIS — J439 Emphysema, unspecified: Secondary | ICD-10-CM | POA: Diagnosis not present

## 2019-08-15 DIAGNOSIS — I2510 Atherosclerotic heart disease of native coronary artery without angina pectoris: Secondary | ICD-10-CM | POA: Diagnosis not present

## 2019-08-15 DIAGNOSIS — E7849 Other hyperlipidemia: Secondary | ICD-10-CM | POA: Diagnosis not present

## 2019-09-15 DIAGNOSIS — I2510 Atherosclerotic heart disease of native coronary artery without angina pectoris: Secondary | ICD-10-CM | POA: Diagnosis not present

## 2019-09-15 DIAGNOSIS — I10 Essential (primary) hypertension: Secondary | ICD-10-CM | POA: Diagnosis not present

## 2019-10-16 DIAGNOSIS — I10 Essential (primary) hypertension: Secondary | ICD-10-CM | POA: Diagnosis not present

## 2019-10-16 DIAGNOSIS — I2510 Atherosclerotic heart disease of native coronary artery without angina pectoris: Secondary | ICD-10-CM | POA: Diagnosis not present

## 2019-10-29 ENCOUNTER — Ambulatory Visit: Payer: Medicare Other | Admitting: Internal Medicine

## 2019-10-29 ENCOUNTER — Other Ambulatory Visit: Payer: Self-pay

## 2019-10-29 ENCOUNTER — Encounter: Payer: Self-pay | Admitting: Internal Medicine

## 2019-10-29 VITALS — BP 120/80 | HR 46 | Ht 73.0 in | Wt 171.0 lb

## 2019-10-29 DIAGNOSIS — R001 Bradycardia, unspecified: Secondary | ICD-10-CM

## 2019-10-29 DIAGNOSIS — E785 Hyperlipidemia, unspecified: Secondary | ICD-10-CM

## 2019-10-29 DIAGNOSIS — I10 Essential (primary) hypertension: Secondary | ICD-10-CM

## 2019-10-29 DIAGNOSIS — I2510 Atherosclerotic heart disease of native coronary artery without angina pectoris: Secondary | ICD-10-CM

## 2019-10-29 MED ORDER — METOPROLOL SUCCINATE ER 25 MG PO TB24: 12.5000 mg | ORAL_TABLET | Freq: Every day | ORAL | 3 refills | Status: DC

## 2019-10-29 NOTE — Progress Notes (Signed)
OFFICE NOTE  Chief Complaint:  No complaints  Primary Care Physician: Rosita Fire, MD  HPI:  Robert Shaw is a 69 year old gentleman whom I have been following for a history of coronary disease. He had a stent in his circumflex and diagonal in 2003 and had moderate disease in the LAD and the right coronary. He continues to smoke, which I have talked to him about as a marked risk factor, and he has been working on some exercise. Unfortunately, on January 31 he had an episode when he developed chest pain, somewhat after eating with some shortness of breath, that went to both arms. He took some aspirin at home and the pain did not go away. He, therefore, presented to the emergency department. He was found to have an elevated troponin of 0.72 and underwent cardiac catheterization. That catheterization, performed by me, showed a severe discrete 95% stenosis in the mid LAD. Both the stents in the diagonal and circumflex were patent with minimal in-stent restenosis. The EF was greater than 60% with no focal wall motion abnormalities. The patient then underwent a Xience drug-eluting stent placement to the mid LAD on March 17, 2012, by Dr. Claiborne Billings. This was successful at reducing the lesion to 0% stenosis.  Since his hospitalization he has done very well. He has had no further chest pain. He was switched successfully from Plavix over to Brilinta, which he is taking with low-dose aspirin. His only complaint now is that he is having some numbness and 2 toes on his right foot. He reports he had problems with his right leg after the catheterization saying that he developed a foot drop but that has progressively gotten better over the past year.  He had no hematoma, bruit or catheterization complications that we were aware of at the time. Unfortunately, he continues to smoke, which is an ongoing risk factor.  [name] returns today for followup. He is doing well denies any chest pain or worsening shortness  of breath. He continues on aspirin and Brillinta. During his last office visit we had recommended starting Chantix for smoking cessation however he has not started that medication. He continues to smoke several cigarettes a day. He has no good reason for why he did not start the medication. He is also anticipating undergoing a switched to Medicare this year. Recent laboratory work was performed by his primary care provider which showed an impaired fasting glucose at 101 and hemoglobin A1c of 6.1 suggesting he may be prediabetic. His creatinine is 1.4. Lipid profile demonstrates total cholesterol 107, HDL 37, triglycerides 95 and LDL 51.  I saw [name] back in the office today. He is currently without any complaints. He denies any chest pain or worsening shortness of breath. Unfortunately has not been able to stop smoking but he is down to about 2 cigarettes a day. As mentioned above his last PCI was in February 2014. He remains on dual antiplatelets therapy with Brilinta, this was for non-STEMI. The data with long-term therapy of Brilinta does not go out much past 2 years therefore we may consider having him come off of this medication.  09/14/2015  [name] returns today for follow-up. He is without complaints. Overall he is doing really well. Denies any shortness of breath or chest pain. He remains physically active. He had recent cholesterol testing through his primary care provider which showed LDL less than 70. He is on Crestor 10 mg daily. Blood pressure is at goal.  10/08/2016  [name] was seen today in follow-up. This is an annual visit. Overall he is asymptomatic. He denies any chest pain or worsening shortness of breath. 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 He is a blood pressure is well-controlled today. He has not had a recent lipid check. His EKG was personally reviewed and shows stable anterolateral T-wave inversions in V3 through V6 as well as high lateral leads of 1 and aVL.  He's not had any coronary events that were aware of since 2014.  10/31/2017  [name] returns for annual visit.  Overall continues to do well.  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  Denies any chest pain or worsening shortness of breath.  He is maintained on low-dose aspirin, metoprolol and Crestor.  He is due for repeat lipid profile as his last study was a year ago.  He has not had any recent testing.  EKG personally reviewed demonstrates stable lateral T wave inversions.  10/29/2019  [name] is seen today in follow-up with his wife.  He continues to do well and is asymptomatic.  He denies chest pain or shortness of breath.  He is noted to have some bradycardia today.  He was previously on metoprolol 12-1/2 twice daily but is now been taking a full tablet at night.  Heart rates in the 40s today but he is asymptomatic.  Lipids have been well controlled with a total cholesterol 104, HDL 36, LDL 52 and triglycerides 77.  Blood pressure is at goal today as well at 120/80.  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 Margo Aye - [facility])   CARDIAC CATHETERIZATION  09/23/2001   patent stents from 06/2001 (Dr. Jackie Plum)   CARDIAC CATHETERIZATION  09/17/2002   no restenosis at prior intervention sites (Dr. Corky Downs)   CARDIAC CATHETERIZATION   09/09/2009   no significant CAD, patent stents in OM1 & LAD (Dr. Corky Downs)    COLONOSCOPY N/A 02/25/2017   Procedure: COLONOSCOPY;  Surgeon: Danie Binder, MD;  Location[facility];  Service: Endoscopy;  Laterality: N/A;  10:30 Am   CORONARY ANGIOPLASTY WITH STENT PLACEMENT  06/19/2001   Cfx (3.5x103mm Zeta stent) & diagonal stenting (2.5x75mm pixel stent) w/moderate LAD disease (Dr. Corky Downs)    CORONARY ANGIOPLASTY WITH STENT PLACEMENT  03/17/2012   Xience DES (2.25x23mm) of mid-distal LAD (Dr. Corky Downs)   LEFT HEART CATHETERIZATION WITH CORONARY ANGIOGRAM N/A 03/17/2012   Procedure: LEFT HEART CATHETERIZATION WITH CORONARY ANGIOGRAM;  Surgeon: Pixie Casino, MD;  Location[facility] CATH LAB;  Service: Cardiovascular;  Laterality: N/A;   PERCUTANEOUS CORONARY STENT INTERVENTION (PCI-S)  03/17/2012   Procedure: PERCUTANEOUS CORONARY STENT INTERVENTION (PCI-S);  Surgeon: Pixie Casino, MD;  Location: [facility] CATH LAB;  Service: Cardiovascular;;   POLYPECTOMY  02/25/2017   Procedure:  POLYPECTOMY;  Surgeon: Danie Binder, MD;  Location: [facility];  Service: Endoscopy;;  cecal x2; ascending x4;hepatic flexure;splenic flexure; descending x2;   TRANSTHORACIC ECHOCARDIOGRAM  03/29/2006   EF normal; mild MR & TR; AV mildly sclerotic    FAMHx:  Family History  Problem Relation Age of Onset   Heart disease Brother        x3   Stroke Father     SOCHx:   reports that he has been smoking cigarettes. He has a 4.00 pack-year smoking history. He has never used smokeless tobacco. He reports that he does not drink alcohol and does not use drugs.  ALLERGIES:  No Known Allergies  ROS: Pertinent items noted in HPI and remainder of comprehensive ROS otherwise negative.  HOME MEDS: Current Outpatient Medications  Medication Sig Dispense Refill   albuterol (VENTOLIN HFA) 108 (90 Base) MCG/ACT inhaler as needed.     aspirin EC 81 MG tablet Take 81 mg by mouth daily.     Cholecalciferol (VITAMIN  D3 PO) Take by mouth daily.     loratadine (CLARITIN) 10 MG tablet Take 10 mg by mouth daily.     nitroGLYCERIN (NITROSTAT) 0.4 MG SL tablet Place 1 tablet (0.4 mg total) under the tongue every 5 (five) minutes as needed for chest pain. 25 tablet 2   rosuvastatin (CRESTOR) 10 MG tablet Take 1 tablet by mouth once daily 90 tablet 0   metoprolol succinate (TOPROL XL) 25 MG 24 hr tablet Take 0.5 tablets (12.5 mg total) by mouth daily. 45 tablet 3   No current facility-administered medications for this visit.    LABS/IMAGING: No results found for this or any previous visit (from the past 48 hour(s)). No results found.  VITALS: BP 120/80    Pulse (!) 46    Ht 6\' 1"  (1.854 m)    Wt 171 lb (77.6 kg)    SpO2 100%    BMI 22.56 kg/m   EXAM: General appearance: alert and no distress Neck: no carotid bruit and no JVD Lungs: clear to auscultation bilaterally Heart: regular rate and rhythm, S1, S2 normal, no murmur, click, rub or gallop Abdomen: soft, non-tender; bowel sounds normal; no masses,  no organomegaly Extremities: extremities normal, atraumatic, no cyanosis or edema Pulses: 2+ and symmetric Skin: Skin color, texture, turgor normal. No rashes or lesions Neurologic: Grossly normal Psych: Mood, affect normal  EKG: Sinus bradycardia at 46-personally reviewed  ASSESSMENT: 1. Coronary artery disease status post PCI to the mid to distal LAD with a Xience DES (03/2012) 2. Dyslipidemia - at goal 3. Hypertension - controlled 4. Tobacco dependence - has Chantix, not ready to quit 5. Anterolateral and inferior T-wave abnormalities-stable  PLAN: 1.   Mr. Urbas is doing well without any significant cardiac symptoms.  Cholesterol is a goal and his blood pressure is well controlled.  His heart rate was low today and may be due to taking his metoprolol full dose at night rather than half dose twice daily.  I advised a decrease in that dose and we will switch him to the Toprol-XL 12.5 mg at  night.  Follow-up annually or sooner as necessary.  Pixie Casino, MD, [facility], [place] Director of the Advanced Lipid Disorders &  Cardiovascular Risk Reduction Clinic Diplomate of the American Board of Clinical Lipidology Attending Cardiologist  Direct Dial: [phone]   Fax: [phone]  Website:  [email or web address] 10/29/2019, 11:47 AM

## 2019-10-29 NOTE — Patient Instructions (Signed)
Medication Instructions:  STOP metoprolol tartrate  START metoprolol succinate 12.5mg  daily   *If you need a refill on your cardiac medications before your next appointment, please call your pharmacy*   Follow-Up: At [facility], you and your health needs are our priority.  As part of our continuing mission to provide you with exceptional heart care, we have created designated Provider Care Teams.  These Care Teams include your primary Cardiologist (physician) and Advanced Practice Providers (APPs -  Physician Assistants and Nurse Practitioners) who all work together to provide you with the care you need, when you need it.  We recommend signing up for the patient portal called "MyChart".  Sign up information is provided on this After Visit Summary.  MyChart is used to connect with patients for Virtual Visits (Telemedicine).  Patients are able to view lab/test results, encounter notes, upcoming appointments, etc.  Non-urgent messages can be sent to your provider as well.   To learn more about what you can do with MyChart, go to [email or web address].    Your next appointment:   12 month(s)  The format for your next appointment:   In Person  Provider:   You may see Pixie Casino, MD or one of the following Advanced Practice Providers on your designated Care Team:    Almyra Deforest, PA-C  Fabian Sharp, PA-C or   Roby Lofts, [place]    Other Instructions

## 2019-11-17 DIAGNOSIS — E7849 Other hyperlipidemia: Secondary | ICD-10-CM | POA: Diagnosis not present

## 2019-11-17 DIAGNOSIS — I10 Essential (primary) hypertension: Secondary | ICD-10-CM | POA: Diagnosis not present

## 2019-11-18 DIAGNOSIS — Z23 Encounter for immunization: Secondary | ICD-10-CM | POA: Diagnosis not present

## 2019-12-22 DIAGNOSIS — M1990 Unspecified osteoarthritis, unspecified site: Secondary | ICD-10-CM | POA: Diagnosis not present

## 2019-12-22 DIAGNOSIS — I10 Essential (primary) hypertension: Secondary | ICD-10-CM | POA: Diagnosis not present

## 2019-12-22 DIAGNOSIS — I2510 Atherosclerotic heart disease of native coronary artery without angina pectoris: Secondary | ICD-10-CM | POA: Diagnosis not present

## 2019-12-22 DIAGNOSIS — F17200 Nicotine dependence, unspecified, uncomplicated: Secondary | ICD-10-CM | POA: Diagnosis not present

## 2019-12-22 DIAGNOSIS — F17210 Nicotine dependence, cigarettes, uncomplicated: Secondary | ICD-10-CM | POA: Diagnosis not present

## 2020-01-21 DIAGNOSIS — E7849 Other hyperlipidemia: Secondary | ICD-10-CM | POA: Diagnosis not present

## 2020-01-21 DIAGNOSIS — I10 Essential (primary) hypertension: Secondary | ICD-10-CM | POA: Diagnosis not present

## 2020-02-01 ENCOUNTER — Encounter: Payer: Self-pay | Admitting: Internal Medicine

## 2020-03-02 DIAGNOSIS — I2510 Atherosclerotic heart disease of native coronary artery without angina pectoris: Secondary | ICD-10-CM | POA: Diagnosis not present

## 2020-03-02 DIAGNOSIS — I10 Essential (primary) hypertension: Secondary | ICD-10-CM | POA: Diagnosis not present

## 2020-03-03 ENCOUNTER — Other Ambulatory Visit: Payer: Self-pay

## 2020-03-03 ENCOUNTER — Ambulatory Visit (INDEPENDENT_AMBULATORY_CARE_PROVIDER_SITE_OTHER): Payer: Self-pay | Admitting: *Deleted

## 2020-03-03 VITALS — Ht 72.0 in | Wt 169.0 lb

## 2020-03-03 DIAGNOSIS — Z86010 Personal history of colonic polyps: Secondary | ICD-10-CM

## 2020-03-03 MED ORDER — NA SULFATE-K SULFATE-MG SULF 17.5-3.13-1.6 GM/177ML PO SOLN: 1.0000 | Freq: Once | ORAL | 0 refills | Status: AC

## 2020-03-03 NOTE — Patient Instructions (Addendum)
Robert Shaw  01-25-1951 MRN: [id number]     Procedure Date: 03/18/2020 Time to register: 12:30 pm Place to register: [facility] Procedure Time: 2:00 pm Scheduled provider: Dr. Abbey Chatters    PREPARATION FOR COLONOSCOPY WITH SUPREP BOWEL PREP KIT  Note: Suprep Bowel Prep Kit is a split-dose (2day) regimen. Consumption of BOTH 6-ounce bottles is required for a complete prep.  Please notify us immediately if you are diabetic, take iron supplements, or if you are on Coumadin or any other blood thinners.  Please hold the following medications: n/a                                                                                                                                                  2 DAYS BEFORE PROCEDURE:  DATE: 03/16/2020   DAY: Wednesday Begin clear liquid diet AFTER your lunch meal. NO SOLID FOODS after this point.  1 DAY BEFORE PROCEDURE:  DATE: 03/17/2020   DAY: Thursday Continue clear liquids the entire day - NO SOLID FOOD.   Diabetic medications adjustments for today: n/a  At 6:00pm: Complete steps 1 through 4 below, using ONE (1) 6-ounce bottle, before going to bed. Step 1:  Pour ONE (1) 6-ounce bottle of SUPREP liquid into the mixing container.  Step 2:  Add cool drinking water to the 16 ounce line on the container and mix.  Note: Dilute the solution concentrate as directed prior to use. Step 3:  DRINK ALL the liquid in the container. Step 4:  You MUST drink an additional two (2) or more 16 ounce containers of water over the next one (1) hour.   Continue clear liquids.  DAY OF PROCEDURE:   DATE: 03/18/2020   DAY: Friday If you take medications for your heart, blood pressure, or breathing, you may take these medications.  Diabetic medications adjustments for today:n/a  5 hours before your procedure at :  9:00 am Step 1:  Pour ONE (1) 6-ounce bottle of SUPREP liquid into the mixing container.  Step 2:  Add cool drinking water to the 16 ounce line on the  container and mix.  Note: Dilute the solution concentrate as directed prior to use. Step 3:  DRINK ALL the liquid in the container. Step 4:  You MUST drink an additional two (2) or more 16 ounce containers of water over the next one (1) hour. You MUST complete the final glass of water at least 3 hours before your colonoscopy. Nothing by mouth past 11:00 am.  You may take your morning medications with sip of water unless we have instructed otherwise.    Please see below for Dietary Information.  CLEAR LIQUIDS INCLUDE:  Water Jello (NOT red in color)   Ice Popsicles (NOT red in color)   Tea (sugar ok, no milk/cream) Powdered fruit flavored drinks  Coffee (sugar ok,  no milk/cream) Gatorade/ Lemonade/ Kool-Aid  (NOT red in color)   Juice: apple, white grape, white cranberry Soft drinks  Clear bullion, consomme, broth (fat free beef/chicken/vegetable)  Carbonated beverages (any kind)  Strained chicken noodle soup Hard Candy   Remember: Clear liquids are liquids that will allow you to see your fingers on the other side of a clear glass. Be sure liquids are NOT red in color, and not cloudy, but CLEAR.  DO NOT EAT OR DRINK ANY OF THE FOLLOWING:  Dairy products of any kind   Cranberry juice Tomato juice / V8 juice   Grapefruit juice Orange juice     Red grape juice  Do not eat any solid foods, including such foods as: cereal, oatmeal, yogurt, fruits, vegetables, creamed soups, eggs, bread, crackers, pureed foods in a blender, etc.   HELPFUL HINTS FOR DRINKING PREP SOLUTION:   Make sure prep is extremely cold. Mix and refrigerate the the morning of the prep. You may also put in the freezer.   You may try mixing some Crystal Light or Country Time Lemonade if you prefer. Mix in small amounts; add more if necessary.  Try drinking through a straw  Rinse mouth with water or a mouthwash between glasses, to remove after-taste.  Try sipping on a cold beverage /ice/ popsicles between glasses of  prep.  Place a piece of sugar-free hard candy in mouth between glasses.  If you become nauseated, try consuming smaller amounts, or stretch out the time between glasses. Stop for 30-60 minutes, then slowly start back drinking.     OTHER INSTRUCTIONS  You will need a responsible adult at least 69 years of age to accompany you and drive you home. This person must remain in the waiting room during your procedure. The hospital will cancel your procedure if you do not have a responsible adult with you.   1. Wear loose fitting clothing that is easily removed. 2. Leave jewelry and other valuables at home.  3. Remove all body piercing jewelry and leave at home. 4. Total time from sign-in until discharge is approximately 2-3 hours. 5. You should go home directly after your procedure and rest. You can resume normal activities the day after your procedure. 6. The day of your procedure you should not:  Drive  Make legal decisions  Operate machinery  Drink alcohol  Return to work   You may call the office (Dept: [phone]) before 5:00pm, or page the doctor on call ([phone]) after 5:00pm, for further instructions, if necessary.   Insurance Information YOU WILL NEED TO CHECK WITH YOUR INSURANCE COMPANY FOR THE BENEFITS OF COVERAGE YOU HAVE FOR THIS PROCEDURE.  UNFORTUNATELY, NOT ALL INSURANCE COMPANIES HAVE BENEFITS TO COVER ALL OR PART OF THESE TYPES OF PROCEDURES.  IT IS YOUR RESPONSIBILITY TO CHECK YOUR BENEFITS, HOWEVER, WE WILL BE GLAD TO ASSIST YOU WITH ANY CODES YOUR INSURANCE COMPANY MAY NEED.    PLEASE NOTE THAT MOST INSURANCE COMPANIES WILL NOT COVER A SCREENING COLONOSCOPY FOR PEOPLE UNDER THE AGE OF 50  IF YOU HAVE BCBS INSURANCE, YOU MAY HAVE BENEFITS FOR A SCREENING COLONOSCOPY BUT IF POLYPS ARE FOUND THE DIAGNOSIS WILL CHANGE AND THEN YOU MAY HAVE A DEDUCTIBLE THAT WILL NEED TO BE MET. SO PLEASE MAKE SURE YOU CHECK YOUR BENEFITS FOR A SCREENING COLONOSCOPY AS WELL AS A  DIAGNOSTIC COLONOSCOPY.      

## 2020-03-03 NOTE — Progress Notes (Signed)
Gastroenterology Pre-Procedure Review  Request Date: 03/03/2020 Requesting Physician: 3 year recall, Last TCS done 02/25/2017 by Dr. Oneida Alar, tubular adenoma (x9) and one polypoid lesion  PATIENT REVIEW QUESTIONS: The patient responded to the following health history questions as indicated:    1. Diabetes Melitis: no 2. Joint replacements in the past 12 months: no 3. Major health problems in the past 3 months: no 4. Has an artificial valve or MVP: no 5. Has a defibrillator: no 6. Has been advised in past to take antibiotics in advance of a procedure like teeth cleaning: no 7. Family history of colon cancer: no  8. Alcohol Use: no 9. Illicit drug Use: no 10. History of sleep apnea: no  11. History of coronary artery or other vascular stents placed within the last 12 months: no 12. History of any prior anesthesia complications: no 13. Body mass index is 22.92 kg/m.    MEDICATIONS & ALLERGIES:    Patient reports the following regarding taking any blood thinners:   Plavix? no Aspirin? yes Coumadin? no Brilinta? no Xarelto? no Eliquis? no Pradaxa? no Savaysa? no Effient? no  Patient confirms/reports the following medications:  Current Outpatient Medications  Medication Sig Dispense Refill  . albuterol (VENTOLIN HFA) 108 (90 Base) MCG/ACT inhaler as needed.    Marland Kitchen aspirin EC 81 MG tablet Take 81 mg by mouth daily.    . Cholecalciferol (VITAMIN D3 PO) Take by mouth daily.    Marland Kitchen loratadine (CLARITIN) 10 MG tablet Take 10 mg by mouth daily.    . metoprolol succinate (TOPROL XL) 25 MG 24 hr tablet Take 0.5 tablets (12.5 mg total) by mouth daily. 45 tablet 3  . nitroGLYCERIN (NITROSTAT) 0.4 MG SL tablet Place 1 tablet (0.4 mg total) under the tongue every 5 (five) minutes as needed for chest pain. 25 tablet 2  . rosuvastatin (CRESTOR) 10 MG tablet Take 1 tablet by mouth once daily 90 tablet 0   No current facility-administered medications for this visit.    Patient confirms/reports the  following allergies:  No Known Allergies  No orders of the defined types were placed in this encounter.   AUTHORIZATION INFORMATION Primary Insurance: [employer] Medicare,  ID [id number] ,  Group #:[id number]  Pre-Cert / Auth required: No, not required  SCHEDULE INFORMATION: Procedure has been scheduled as follows:  Date: 03/18/2020, Time: 2:00 Location: [facility] with Dr. Abbey Chatters  This Gastroenterology Pre-Precedure Review Form is being routed to the following provider(s): Roseanne Kaufman, NP

## 2020-03-07 ENCOUNTER — Telehealth: Payer: Self-pay | Admitting: Internal Medicine

## 2020-03-07 NOTE — Progress Notes (Signed)
Appropriate. ASA 2.  

## 2020-03-07 NOTE — Telephone Encounter (Signed)
PATIENT WIFE CALLED AND SAID THE PREP WAS TOO EXPENSIVE     CAN SOMETHING ELSE BE CALLED IN

## 2020-03-08 NOTE — Telephone Encounter (Signed)
Called pt back and he said that he decided to keep RX for Suprep.  He said he didn't want to switch to [facility] because it was so much to drink.  Informed him that I would keep everything like we had it.  Pt voiced understanding.

## 2020-03-14 ENCOUNTER — Telehealth: Payer: Self-pay | Admitting: *Deleted

## 2020-03-14 NOTE — Telephone Encounter (Signed)
Called pt. He is aware we need to r/s procedure scheduled for 2/4. He is now on for 3/4 (pm appt). Aware will mail new prep instructions with new covid test appt. He voiced understanding.

## 2020-03-16 ENCOUNTER — Inpatient Hospital Stay (HOSPITAL_COMMUNITY): Admission: RE | Admit: 2020-03-16 | Payer: Medicare Other | Source: Ambulatory Visit

## 2020-04-02 DIAGNOSIS — I10 Essential (primary) hypertension: Secondary | ICD-10-CM | POA: Diagnosis not present

## 2020-04-02 DIAGNOSIS — I2510 Atherosclerotic heart disease of native coronary artery without angina pectoris: Secondary | ICD-10-CM | POA: Diagnosis not present

## 2020-04-13 ENCOUNTER — Other Ambulatory Visit: Payer: Self-pay

## 2020-04-13 ENCOUNTER — Other Ambulatory Visit (HOSPITAL_COMMUNITY)
Admission: RE | Admit: 2020-04-13 | Discharge: 2020-04-13 | Disposition: A | Discharge status: Home / Self Care | Payer: Medicare Other | Source: Ambulatory Visit | Attending: Internal Medicine | Admitting: Internal Medicine

## 2020-04-13 DIAGNOSIS — Z20822 Contact with and (suspected) exposure to covid-19: Secondary | ICD-10-CM | POA: Insufficient documentation

## 2020-04-13 DIAGNOSIS — Z01812 Encounter for preprocedural laboratory examination: Secondary | ICD-10-CM | POA: Insufficient documentation

## 2020-04-13 LAB — SARS CORONAVIRUS 2 (TAT 6-24 HRS): SARS Coronavirus 2: NEGATIVE

## 2020-04-15 ENCOUNTER — Ambulatory Visit (HOSPITAL_COMMUNITY): Payer: Medicare Other | Admitting: Anesthesiology

## 2020-04-15 ENCOUNTER — Other Ambulatory Visit: Payer: Self-pay

## 2020-04-15 ENCOUNTER — Encounter (HOSPITAL_COMMUNITY): Payer: Self-pay

## 2020-04-15 ENCOUNTER — Ambulatory Visit (HOSPITAL_COMMUNITY)
Admission: RE | Admit: 2020-04-15 | Discharge: 2020-04-15 | Disposition: A | Discharge status: Home / Self Care | Payer: Medicare Other | Attending: Internal Medicine | Admitting: Internal Medicine

## 2020-04-15 ENCOUNTER — Encounter (HOSPITAL_COMMUNITY)
Admission: RE | Disposition: A | Discharge status: Home / Self Care | Payer: Self-pay | Source: Home / Self Care | Attending: Internal Medicine

## 2020-04-15 DIAGNOSIS — D120 Benign neoplasm of cecum: Secondary | ICD-10-CM | POA: Insufficient documentation

## 2020-04-15 DIAGNOSIS — Z79899 Other long term (current) drug therapy: Secondary | ICD-10-CM | POA: Diagnosis not present

## 2020-04-15 DIAGNOSIS — K648 Other hemorrhoids: Secondary | ICD-10-CM | POA: Insufficient documentation

## 2020-04-15 DIAGNOSIS — I2510 Atherosclerotic heart disease of native coronary artery without angina pectoris: Secondary | ICD-10-CM | POA: Diagnosis not present

## 2020-04-15 DIAGNOSIS — Z1211 Encounter for screening for malignant neoplasm of colon: Secondary | ICD-10-CM | POA: Insufficient documentation

## 2020-04-15 DIAGNOSIS — Z86010 Personal history of colonic polyps: Secondary | ICD-10-CM | POA: Insufficient documentation

## 2020-04-15 DIAGNOSIS — Z955 Presence of coronary angioplasty implant and graft: Secondary | ICD-10-CM | POA: Diagnosis not present

## 2020-04-15 DIAGNOSIS — Z7982 Long term (current) use of aspirin: Secondary | ICD-10-CM | POA: Insufficient documentation

## 2020-04-15 DIAGNOSIS — F17210 Nicotine dependence, cigarettes, uncomplicated: Secondary | ICD-10-CM | POA: Insufficient documentation

## 2020-04-15 DIAGNOSIS — K635 Polyp of colon: Secondary | ICD-10-CM

## 2020-04-15 HISTORY — PX: COLONOSCOPY WITH PROPOFOL: SHX5780

## 2020-04-15 HISTORY — PX: POLYPECTOMY: SHX5525

## 2020-04-15 SURGERY — COLONOSCOPY WITH PROPOFOL
Anesthesia: General

## 2020-04-15 MED ORDER — PROPOFOL 10 MG/ML IV BOLUS
INTRAVENOUS | Status: DC | PRN
  Administered 2020-04-15: 80 mg via INTRAVENOUS

## 2020-04-15 MED ORDER — PROPOFOL 500 MG/50ML IV EMUL
INTRAVENOUS | Status: DC | PRN
  Administered 2020-04-15: 150 ug/kg/min via INTRAVENOUS

## 2020-04-15 MED ORDER — LACTATED RINGERS IV SOLN: INTRAVENOUS | Status: DC

## 2020-04-15 NOTE — Op Note (Signed)
[facility] Patient Name: Robert Shaw Procedure Date: 04/15/2020 10:48 AM MRN: [id number] Date of Birth: 01-18-1951 Attending MD: Elon Alas. Edgar Frisk CSN: [id number] Age: 70 Admit Type: Outpatient Procedure:                Colonoscopy Indications:              High risk colon cancer surveillance: Personal                            history of colonic polyps Providers:                Elon Alas. Abbey Chatters, DO, Gwynneth Albright RN, RN,                            Randa Spike, Technician Referring MD:              Medicines:                See the Anesthesia note for documentation of the                            administered medications Complications:            No immediate complications. Estimated Blood Loss:     Estimated blood loss was minimal. Procedure:                Pre-Anesthesia Assessment:                           - The anesthesia plan was to use monitored                            anesthesia care (MAC).                           After obtaining informed consent, the colonoscope                            was passed under direct vision. Throughout the                            procedure, the patient's blood pressure, pulse, and                            oxygen saturations were monitored continuously. The                            [id number]-HQ190L ([id number]) scope was introduced through                            the anus and advanced to the the cecum, identified                            by appendiceal orifice and ileocecal valve. The                            colonoscopy was performed without difficulty.  The                            patient tolerated the procedure well. The quality                            of the bowel preparation was evaluated using the                            BBPS [facility] Bowel Preparation Scale) with scores                            of: Right Colon = 2 (minor amount of residual                            staining, small fragments of  stool and/or opaque                            liquid, but mucosa seen well), Transverse Colon = 3                            (entire mucosa seen well with no residual staining,                            small fragments of stool or opaque liquid) and Left                            Colon = 3 (entire mucosa seen well with no residual                            staining, small fragments of stool or opaque                            liquid). The total BBPS score equals 8. The quality                            of the bowel preparation was good. Scope In: 11:27:01 AM Scope Out: 11:37:43 AM Scope Withdrawal Time: 0 hours 8 minutes 5 seconds  Total Procedure Duration: 0 hours 10 minutes 42 seconds  Findings:      The perianal and digital rectal examinations were normal.      Non-bleeding internal hemorrhoids were found during endoscopy.      A 7 mm polyp was found in the cecum. The polyp was sessile. The polyp       was removed with a cold snare. Resection and retrieval were complete.      The exam was otherwise without abnormality. Impression:               - Non-bleeding internal hemorrhoids.                           - One 7 mm polyp in the cecum, removed with a cold  snare. Resected and retrieved.                           - The examination was otherwise normal. Moderate Sedation:      Per Anesthesia Care Recommendation:           - Patient has a contact number available for                            emergencies. The signs and symptoms of potential                            delayed complications were discussed with the                            patient. Return to normal activities tomorrow.                            Written discharge instructions were provided to the                            patient.                           - Resume previous diet.                           - Continue present medications.                           - Await pathology  results.                           - Repeat colonoscopy in 5 years for surveillance.                           - Return to GI clinic PRN. Procedure Code(s):        --- Professional ---                           [phone], Colonoscopy, flexible; with removal of                            tumor(s), polyp(s), or other lesion(s) by snare                            technique Diagnosis Code(s):        --- Professional ---                           Z86.010, Personal history of colonic polyps                           K63.5, Polyp of colon                           K64.8, Other hemorrhoids CPT copyright 2019 American Medical Association. All  rights reserved. The codes documented in this report are preliminary and upon coder review may  be revised to meet current compliance requirements. Elon Alas. Abbey Chatters, DO Carmen Abbey Chatters, DO 04/15/2020 11:40:21 AM This report has been signed electronically. Number of Addenda: 0

## 2020-04-15 NOTE — Anesthesia Postprocedure Evaluation (Signed)
Anesthesia Post Note  Patient: Robert Shaw  Procedure(s) Performed: COLONOSCOPY WITH PROPOFOL (N/A ) POLYPECTOMY  Patient location during evaluation: Phase II Anesthesia Type: General Level of consciousness: awake and alert Pain management: satisfactory to patient Vital Signs Assessment: post-procedure vital signs reviewed and stable Respiratory status: spontaneous breathing and respiratory function stable Cardiovascular status: blood pressure returned to baseline and stable Postop Assessment: no apparent nausea or vomiting Anesthetic complications: no   No complications documented.   Last Vitals:  Vitals:   04/15/20 1114 04/15/20 1141  BP: (!) 162/81 122/70  Pulse: (!) 52   Resp: 17 (!) 22  Temp: 36.8 C 36.9 C  SpO2: 99% 100%    Last Pain:  Vitals:   04/15/20 1141  TempSrc: Oral  PainSc:                  Karna Dupes

## 2020-04-15 NOTE — Anesthesia Preprocedure Evaluation (Addendum)
Anesthesia Evaluation  Patient identified by MRN, date of birth, ID band Patient awake    Reviewed: Allergy & Precautions, NPO status , reviewed documented beta blocker date and time   History of Anesthesia Complications Negative for: history of anesthetic complications  Airway Mallampati: II  TM Distance: >3 FB Neck ROM: Full    Dental  (+) Dental Advisory Given, Teeth Intact   Pulmonary neg COPD,  COPD inhaler, Current SmokerPatient did not abstain from smoking.,    Pulmonary exam normal breath sounds clear to auscultation       Cardiovascular Exercise Tolerance: Good hypertension, Pt. on medications and Pt. on home beta blockers + CAD, + Past MI and + Cardiac Stents  Normal cardiovascular exam Rhythm:Regular Rate:Normal     Neuro/Psych negative neurological ROS  negative psych ROS   GI/Hepatic negative GI ROS, Neg liver ROS,   Endo/Other  negative endocrine ROS  Renal/GU Renal InsufficiencyRenal disease     Musculoskeletal negative musculoskeletal ROS (+)   Abdominal   Peds  Hematology negative hematology ROS (+)   Anesthesia Other Findings   Reproductive/Obstetrics negative OB ROS                            Anesthesia Physical Anesthesia Plan  ASA: III  Anesthesia Plan: General   Post-op Pain Management:    Induction: Intravenous  PONV Risk Score and Plan: TIVA  Airway Management Planned: Nasal Cannula, Natural Airway and Simple Face Mask  Additional Equipment:   Intra-op Plan:   Post-operative Plan:   Informed Consent: I have reviewed the patients History and Physical, chart, labs and discussed the procedure including the risks, benefits and alternatives for the proposed anesthesia with the patient or authorized representative who has indicated his/her understanding and acceptance.     Dental advisory given  Plan Discussed with: CRNA and Surgeon  Anesthesia  Plan Comments:         Anesthesia Quick Evaluation

## 2020-04-15 NOTE — Transfer of Care (Signed)
Immediate Anesthesia Transfer of Care Note  Patient: Robert Shaw  Procedure(s) Performed: COLONOSCOPY WITH PROPOFOL (N/A ) POLYPECTOMY  Patient Location: PACU  Anesthesia Type:General  Level of Consciousness: awake and alert   Airway & Oxygen Therapy: Patient Spontanous Breathing  Post-op Assessment: Report given to RN and Post -op Vital signs reviewed and stable  Post vital signs: Reviewed and stable  Last Vitals:  Vitals Value Taken Time  BP    Temp    Pulse    Resp    SpO2      Last Pain:  Vitals:   04/15/20 1124  TempSrc:   PainSc: 0-No pain      Patients Stated Pain Goal: 6 (40/37/09 6438)  Complications: No complications documented.

## 2020-04-15 NOTE — H&P (Addendum)
Primary Care Physician:  Rosita Fire, MD Primary Gastroenterologist:  Dr. Abbey Chatters  Pre-Procedure History & Physical: HPI:  Robert Shaw is a 70 y.o. male is here for a colonoscopy to be performed for surveillance due to personal history of numerous adenomatous colon polyps.  Last colonoscopy 2019 with 10 polyps removed.  Tubular adenomas on pathology  Patient denies any family history of colorectal cancer.  No melena or hematochezia.  No abdominal pain or unintentional weight loss.  No change in bowel habits.  Overall feels well from a GI standpoint.  Past Medical History:  Diagnosis Date  . 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 Coronary artery disease   . Dyslipidemia   . Family history of heart disease   . History of nuclear stress test 07/13/2008   dipyridamole; normal perfusion, no ischemia, low risk   . Hypertension   . NSTEMI (non-ST elevated myocardial infarction) ([place])    h/o  . Tobacco abuse     Past Surgical History:  Procedure Laterality Date  . CARDIAC CATHETERIZATION  04/2001   after high lateral MI, preserved LV function w/mild mid anterolateral hypokinesia, diffuse coronary irregularity with signficiant obstruction in LAD, OM1, superior branch of ramus (Dr. Margo Aye - [facility])  . CARDIAC CATHETERIZATION  09/23/2001   patent stents from 06/2001 (Dr. Jackie Plum)  . CARDIAC CATHETERIZATION  09/17/2002   no restenosis at prior intervention sites (Dr. Corky Downs)  . CARDIAC CATHETERIZATION  09/09/2009   no significant CAD, patent stents in OM1 & LAD (Dr. Corky Downs)   . COLONOSCOPY N/A 02/25/2017   Procedure: COLONOSCOPY;  Surgeon: Danie Binder, MD;  Location: [facility];  Service: Endoscopy;  Laterality: N/A;  10:30 Am  . CORONARY ANGIOPLASTY WITH STENT PLACEMENT  06/19/2001   Cfx (3.5x56mm Zeta stent) & diagonal stenting (2.5x76mm pixel stent) w/moderate LAD disease (Dr. Corky Downs)   . CORONARY ANGIOPLASTY WITH STENT PLACEMENT  03/17/2012   Xience DES  (2.25x47mm) of mid-distal LAD (Dr. Corky Downs)  . LEFT HEART CATHETERIZATION WITH CORONARY ANGIOGRAM N/A 03/17/2012   Procedure: LEFT HEART CATHETERIZATION WITH CORONARY ANGIOGRAM;  Surgeon: Pixie Casino, MD;  Location: [facility] CATH LAB;  Service: Cardiovascular;  Laterality: N/A;  . PERCUTANEOUS CORONARY STENT INTERVENTION (PCI-S)  03/17/2012   Procedure: PERCUTANEOUS CORONARY STENT INTERVENTION (PCI-S);  Surgeon: Pixie Casino, MD;  Location: [facility] CATH LAB;  Service: Cardiovascular;;  . POLYPECTOMY  02/25/2017   Procedure: POLYPECTOMY;  Surgeon: Danie Binder, MD;  Location: [facility];  Service: Endoscopy;;  cecal x2; ascending x4;hepatic flexure;splenic flexure; descending x2;  . TRANSTHORACIC ECHOCARDIOGRAM  03/29/2006   EF normal; mild MR & TR; AV mildly sclerotic    Prior to Admission medications   Medication Sig Start Date End Date Taking? Authorizing Provider  albuterol (VENTOLIN HFA) 108 (90 Base) MCG/ACT inhaler Inhale 2 puffs into the lungs every 4 (four) hours as needed for wheezing or shortness of breath. 09/16/19  Yes [provider]  aspirin EC 81 MG tablet Take 81 mg by mouth daily.   Yes [provider]  Cholecalciferol (VITAMIN D3) 50 MCG (2000 UT) capsule Take 2,000 Units by mouth daily.   Yes [provider]  loratadine (CLARITIN) 10 MG tablet Take 10 mg by mouth daily.   Yes [provider]  metoprolol succinate (TOPROL XL) 25 MG 24 hr tablet Take 0.5 tablets (12.5 mg total) by mouth daily. 10/29/19  Yes Hilty, Nadean Corwin, MD  nitroGLYCERIN (NITROSTAT) 0.4 MG  SL tablet Place 1 tablet (0.4 mg total) under the tongue every 5 (five) minutes as needed for chest pain. 10/08/16  Yes Hilty, Nadean Corwin, MD  rosuvastatin (CRESTOR) 10 MG tablet Take 1 tablet by mouth once daily Patient taking differently: Take 10 mg by mouth daily. 05/20/18  Yes Pixie Casino, MD    Allergies as of 03/08/2020  . (No Known Allergies)    Family History  Problem  Relation Age of Onset  . Heart disease Brother        x3  . Stroke Father     Social History   Socioeconomic History  . Marital status: Married    Spouse name: Not on file  . Number of children: 1  . Years of education: Not on file  . Highest education level: Not on file  Occupational History  . Not on file  Tobacco Use  . Smoking status: Current Every Day Smoker    Packs/day: 0.10    Years: 40.00    Pack years: 4.00    Types: Cigarettes  . Smokeless tobacco: Never Used  . Tobacco comment: smokes 3-4 cigarettes daily ("it all depends") 10/27/13  Substance and Sexual Activity  . Alcohol use: No  . Drug use: No  . Sexual activity: Not on file  Other Topics Concern  . Not on file  Social History Narrative  . Not on file   Social Determinants of Health   Financial Resource Strain: Not on file  Food Insecurity: Not on file  Transportation Needs: Not on file  Physical Activity: Not on file  Stress: Not on file  Social Connections: Not on file  Intimate Partner Violence: Not on file    Review of Systems: See HPI, otherwise negative ROS  Physical Exam: Vital signs in last 24 hours:     General:   Alert,  Well-developed, well-nourished, pleasant and cooperative in NAD Head:  Normocephalic and atraumatic. Eyes:  Sclera clear, no icterus.   Conjunctiva pink. Ears:  Normal auditory acuity. Nose:  No deformity, discharge,  or lesions. Mouth:  No deformity or lesions, dentition normal. Neck:  Supple; no masses or thyromegaly. Lungs:  Clear throughout to auscultation.   No wheezes, crackles, or rhonchi. No acute distress. Heart:  Regular rate and rhythm; no murmurs, clicks, rubs,  or gallops. Abdomen:  Soft, nontender and nondistended. No masses, hepatosplenomegaly or hernias noted. Normal bowel sounds, without guarding, and without rebound.   Msk:  Symmetrical without gross deformities. Normal posture. Extremities:  Without clubbing or edema. Neurologic:  Alert and   oriented x4;  grossly normal neurologically. Skin:  Intact without significant lesions or rashes. Cervical Nodes:  No significant cervical adenopathy. Psych:  Alert and cooperative. Normal mood and affect.  Impression/Plan: TOMOKI LUCKEN is here for a colonoscopy to be performed for surveillance due to personal history of numerous adenomatous colon polyps.  The risks of the procedure including infection, bleed, or perforation as well as benefits, limitations, alternatives and imponderables have been reviewed with the patient. Questions have been answered. All parties agreeable.

## 2020-04-15 NOTE — Discharge Instructions (Addendum)
Colonoscopy Discharge Instructions  Read the instructions outlined below and refer to this sheet in the next few weeks. These discharge instructions provide you with general information on caring for yourself after you leave the hospital. Your doctor may also give you specific instructions. While your treatment has been planned according to the most current medical practices available, unavoidable complications occasionally occur.   ACTIVITY  You may resume your regular activity, but move at a slower pace for the next 24 hours.   Take frequent rest periods for the next 24 hours.   Walking will help get rid of the air and reduce the bloated feeling in your belly (abdomen).   No driving for 24 hours (because of the medicine (anesthesia) used during the test).    Do not sign any important legal documents or operate any machinery for 24 hours (because of the anesthesia used during the test).  NUTRITION  Drink plenty of fluids.   You may resume your normal diet as instructed by your doctor.   Begin with a light meal and progress to your normal diet. Heavy or fried foods are harder to digest and may make you feel sick to your stomach (nauseated).   Avoid alcoholic beverages for 24 hours or as instructed.  MEDICATIONS  You may resume your normal medications unless your doctor tells you otherwise.  WHAT YOU CAN EXPECT TODAY  Some feelings of bloating in the abdomen.   Passage of more gas than usual.   Spotting of blood in your stool or on the toilet paper.  IF YOU HAD POLYPS REMOVED DURING THE COLONOSCOPY:  No aspirin products for 7 days or as instructed.   No alcohol for 7 days or as instructed.   Eat a soft diet for the next 24 hours.  FINDING OUT THE RESULTS OF YOUR TEST Not all test results are available during your visit. If your test results are not back during the visit, make an appointment with your caregiver to find out the results. Do not assume everything is normal if  you have not heard from your caregiver or the medical facility. It is important for you to follow up on all of your test results.  SEEK IMMEDIATE MEDICAL ATTENTION IF:  You have more than a spotting of blood in your stool.   Your belly is swollen (abdominal distention).   You are nauseated or vomiting.   You have a temperature over 101.   You have abdominal pain or discomfort that is severe or gets worse throughout the day.   Your colonoscopy 1 revealed  polyp(s) which I removed successfully. Await pathology results, my office will contact you. I recommend repeating colonoscopy in 5 years for surveillance purposes.    I hope you have a great rest of your week!  Elon Alas. Abbey Chatters, D.O. Gastroenterology and Hepatology [facility]   Colon Polyps  Colon polyps are tissue growths inside the colon, which is part of the large intestine. They are one of the types of polyps that can grow in the body. A polyp may be a round bump or a mushroom-shaped growth. You could have one polyp or more than one. Most colon polyps are noncancerous (benign). However, some colon polyps can become cancerous over time. Finding and removing the polyps early can help prevent this. What are the causes? The exact cause of colon polyps is not known. What increases the risk? The following factors may make you more likely to develop this condition:  Having a  family history of colorectal cancer or colon polyps.  Being older than 69 years of age.  Being younger than 69 years of age and having a significant family history of colorectal cancer or colon polyps or a genetic condition that puts you at higher risk of getting colon polyps.  Having inflammatory bowel disease, such as ulcerative colitis or Crohn's disease.  Having certain conditions passed from parent to child (hereditary conditions), such as: ? Familial adenomatous polyposis (FAP). ? Lynch syndrome. ? Turcot  syndrome. ? Peutz-Jeghers syndrome. ? MUTYH-associated polyposis (MAP).  Being overweight.  Certain lifestyle factors. These include smoking cigarettes, drinking too much alcohol, not getting enough exercise, and eating a diet that is high in fat and red meat and low in fiber.  Having had childhood cancer that was treated with radiation of the abdomen. What are the signs or symptoms? Many times, there are no symptoms. If you have symptoms, they may include:  Blood coming from the rectum during a bowel movement.  Blood in the stool (feces). The blood may be bright red or very dark in color.  Pain in the abdomen.  A change in bowel habits, such as constipation or diarrhea. How is this diagnosed? This condition is diagnosed with a colonoscopy. This is a procedure in which a lighted, flexible scope is inserted into the opening between the buttocks (anus) and then passed into the colon to examine the area. Polyps are sometimes found when a colonoscopy is done as part of routine cancer screening tests. How is this treated? This condition is treated by removing any polyps that are found. Most polyps can be removed during a colonoscopy. Those polyps will then be tested for cancer. Additional treatment may be needed depending on the results of testing. Follow these instructions at home: Eating and drinking  Eat foods that are high in fiber, such as fruits, vegetables, and whole grains.  Eat foods that are high in calcium and vitamin D, such as milk, cheese, yogurt, eggs, liver, fish, and broccoli.  Limit foods that are high in fat, such as fried foods and desserts.  Limit the amount of red meat, precooked or cured meat, or other processed meat that you eat, such as hot dogs, sausages, bacon, or meat loaves.  Limit sugary drinks.   Lifestyle  Maintain a healthy weight, or lose weight if recommended by your health care provider.  Exercise every day or as told by your health care  provider.  Do not use any products that contain nicotine or tobacco, such as cigarettes, e-cigarettes, and chewing tobacco. If you need help quitting, ask your health care provider.  Do not drink alcohol if: ? Your health care provider tells you not to drink. ? You are pregnant, may be pregnant, or are planning to become pregnant.  If you drink alcohol: ? Limit how much you use to:  0-1 drink a day for women.  0-2 drinks a day for men. ? Know how much alcohol is in your drink. In the [place] one drink equals one 12 oz bottle of beer (355 mL), one 5 oz glass of wine (148 mL), or one 1 oz glass of hard liquor (44 mL). General instructions  Take over-the-counter and prescription medicines only as told by your health care provider.  Keep all follow-up visits. This is important. This includes having regularly scheduled colonoscopies. Talk to your health care provider about when you need a colonoscopy. Contact a health care provider if:  You have new or worsening  bleeding during a bowel movement.  You have new or increased blood in your stool.  You have a change in bowel habits.  You lose weight for no known reason. Summary  Colon polyps are tissue growths inside the colon, which is part of the large intestine. They are one type of polyp that can grow in the body.  Most colon polyps are noncancerous (benign), but some can become cancerous over time.  This condition is diagnosed with a colonoscopy.  This condition is treated by removing any polyps that are found. Most polyps can be removed during a colonoscopy. This information is not intended to replace advice given to you by your health care provider. Make sure you discuss any questions you have with your health care provider. Document Revised: 05/20/2019 Document Reviewed: 05/20/2019 Elsevier Patient Education  2021 [employer].

## 2020-04-18 LAB — SURGICAL PATHOLOGY

## 2020-04-20 ENCOUNTER — Encounter (HOSPITAL_COMMUNITY): Payer: Self-pay | Admitting: Internal Medicine

## 2020-04-26 NOTE — Progress Notes (Signed)
Dear Mr. 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 You will need to repeat your colonoscopy in 5 years as discussed postoperatively. Follow up with GI as needed.     Thank you, Floria Raveling, CMA

## 2020-04-30 DIAGNOSIS — I10 Essential (primary) hypertension: Secondary | ICD-10-CM | POA: Diagnosis not present

## 2020-04-30 DIAGNOSIS — I2510 Atherosclerotic heart disease of native coronary artery without angina pectoris: Secondary | ICD-10-CM | POA: Diagnosis not present

## 2020-05-31 DIAGNOSIS — I2510 Atherosclerotic heart disease of native coronary artery without angina pectoris: Secondary | ICD-10-CM | POA: Diagnosis not present

## 2020-05-31 DIAGNOSIS — I10 Essential (primary) hypertension: Secondary | ICD-10-CM | POA: Diagnosis not present

## 2020-06-14 DIAGNOSIS — F17210 Nicotine dependence, cigarettes, uncomplicated: Secondary | ICD-10-CM | POA: Diagnosis not present

## 2020-06-14 DIAGNOSIS — Z1389 Encounter for screening for other disorder: Secondary | ICD-10-CM | POA: Diagnosis not present

## 2020-06-14 DIAGNOSIS — Z0001 Encounter for general adult medical examination with abnormal findings: Secondary | ICD-10-CM | POA: Diagnosis not present

## 2020-06-14 DIAGNOSIS — E785 Hyperlipidemia, unspecified: Secondary | ICD-10-CM | POA: Diagnosis not present

## 2020-06-14 DIAGNOSIS — I10 Essential (primary) hypertension: Secondary | ICD-10-CM | POA: Diagnosis not present

## 2020-06-14 DIAGNOSIS — I2510 Atherosclerotic heart disease of native coronary artery without angina pectoris: Secondary | ICD-10-CM | POA: Diagnosis not present

## 2020-07-15 DIAGNOSIS — I10 Essential (primary) hypertension: Secondary | ICD-10-CM | POA: Diagnosis not present

## 2020-07-15 DIAGNOSIS — I2510 Atherosclerotic heart disease of native coronary artery without angina pectoris: Secondary | ICD-10-CM | POA: Diagnosis not present

## 2020-08-14 DIAGNOSIS — I10 Essential (primary) hypertension: Secondary | ICD-10-CM | POA: Diagnosis not present

## 2020-08-14 DIAGNOSIS — I2510 Atherosclerotic heart disease of native coronary artery without angina pectoris: Secondary | ICD-10-CM | POA: Diagnosis not present

## 2020-08-24 DIAGNOSIS — I10 Essential (primary) hypertension: Secondary | ICD-10-CM | POA: Diagnosis not present

## 2020-08-24 DIAGNOSIS — Z0001 Encounter for general adult medical examination with abnormal findings: Secondary | ICD-10-CM | POA: Diagnosis not present

## 2020-08-24 DIAGNOSIS — Z79899 Other long term (current) drug therapy: Secondary | ICD-10-CM | POA: Diagnosis not present

## 2020-08-25 DIAGNOSIS — R252 Cramp and spasm: Secondary | ICD-10-CM | POA: Diagnosis not present

## 2020-08-25 DIAGNOSIS — M79605 Pain in left leg: Secondary | ICD-10-CM | POA: Diagnosis not present

## 2020-08-29 ENCOUNTER — Other Ambulatory Visit (HOSPITAL_COMMUNITY): Payer: Self-pay | Admitting: Gerontology

## 2020-08-29 ENCOUNTER — Other Ambulatory Visit: Payer: Self-pay

## 2020-08-29 ENCOUNTER — Ambulatory Visit (HOSPITAL_COMMUNITY)
Admission: RE | Admit: 2020-08-29 | Discharge: 2020-08-29 | Disposition: A | Discharge status: Home / Self Care | Payer: Medicare Other | Source: Ambulatory Visit | Attending: Gerontology | Admitting: Gerontology

## 2020-08-29 DIAGNOSIS — M25552 Pain in left hip: Secondary | ICD-10-CM | POA: Diagnosis not present

## 2020-09-15 ENCOUNTER — Ambulatory Visit (INDEPENDENT_AMBULATORY_CARE_PROVIDER_SITE_OTHER): Payer: Medicare Other | Admitting: Orthopaedic Surgery

## 2020-09-15 ENCOUNTER — Telehealth: Payer: Self-pay | Admitting: Orthopaedic Surgery

## 2020-09-15 ENCOUNTER — Ambulatory Visit: Payer: Medicare Other

## 2020-09-15 ENCOUNTER — Encounter: Payer: Self-pay | Admitting: Orthopaedic Surgery

## 2020-09-15 ENCOUNTER — Other Ambulatory Visit: Payer: Self-pay

## 2020-09-15 VITALS — BP 184/91 | HR 51 | Ht 72.0 in | Wt 164.2 lb

## 2020-09-15 DIAGNOSIS — M5442 Lumbago with sciatica, left side: Secondary | ICD-10-CM | POA: Diagnosis not present

## 2020-09-15 MED ORDER — PREDNISONE 10 MG (21) PO TBPK: ORAL_TABLET | ORAL | 1 refills | Status: DC

## 2020-09-15 MED ORDER — HYDROCODONE-ACETAMINOPHEN 5-325 MG PO TABS: ORAL_TABLET | ORAL | 0 refills | Status: DC

## 2020-09-15 NOTE — Progress Notes (Signed)
Subjective:    Patient ID: Robert Shaw, male    DOB: Mar 28, 1950, 70 y.o.   MRN: [id number]  HPI He has had pain in the left hip area for about a month getting worse.  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  He has no trauma, no falls.  He has deep pain that radiates past the right hip, posterior thigh, past the knee to the lower leg to the anterior foot.  It gets numb at times.  He has tried Tylenol, heat, ice and some rest with no help.  He has no bowel or bladder problem.  His wife accompanies him.   Review of Systems  Constitutional:  Positive for activity change.  Musculoskeletal:  Positive for arthralgias and back pain.  All other systems reviewed and are negative. For Review of Systems, all other systems reviewed and are negative.  The following is a summary of the past history medically, past history surgically, known current medicines, social history and family history.  This information is gathered electronically by the computer from prior information and documentation.  I review this each visit and have found including this information at this point in the chart is beneficial and informative.   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 Margo Aye - [facility])   CARDIAC CATHETERIZATION  09/23/2001   patent stents from 06/2001 (Dr. Jackie Plum)   CARDIAC  CATHETERIZATION  09/17/2002   no restenosis at prior intervention sites (Dr. Corky Downs)   CARDIAC CATHETERIZATION  09/09/2009   no significant CAD, patent stents in OM1 & LAD (Dr. Corky Downs)    COLONOSCOPY N/A 02/25/2017   Procedure: COLONOSCOPY;  Surgeon: Danie Binder, MD;  [facility];  Service: Endoscopy;  Laterality: N/A;  10:30 Am   COLONOSCOPY WITH PROPOFOL N/A 04/15/2020   Procedure: COLONOSCOPY WITH PROPOFOL;  Surgeon: Eloise Harman, DO;  [facility];  Service: Endoscopy;  Laterality: N/A;  pm ASA II/   CORONARY ANGIOPLASTY WITH STENT PLACEMENT  06/19/2001   Cfx (3.5x54m Zeta stent) & diagonal stenting (2.5x126mpixel stent) w/moderate LAD disease (Dr. T.Corky Downs   CORONARY ANGIOPLASTY WITH STENT PLACEMENT  03/17/2012   Xience DES (2.25x1536mof mid-distal LAD (Dr. T. Corky Downs LEFT HEART CATHETERIZATION WITH CORONARY ANGIOGRAM N/A 03/17/2012   Procedure: LEFT HEART CATHETERIZATION WITH CORONARY ANGIOGRAM;  Surgeon: KenPixie CasinoD;  [facility];  Service: Cardiovascular;  Laterality: N/A;   PERCUTANEOUS CORONARY STENT INTERVENTION (PCI-S)  03/17/2012   Procedure: PERCUTANEOUS CORONARY STENT INTERVENTION (PCI-S);  Surgeon: KenPixie CasinoD;  [facility];  Service: Cardiovascular;;   POLYPECTOMY  02/25/2017   Procedure: POLYPECTOMY;  Surgeon: FieDanie BinderD;  [facility];  Service: Endoscopy;;  cecal x2; ascending x4;hepatic flexure;splenic flexure; descending x2;  POLYPECTOMY  04/15/2020   Procedure: POLYPECTOMY;  Surgeon: Eloise Harman, DO;  [facility];  Service: Endoscopy;;  cecal    TRANSTHORACIC ECHOCARDIOGRAM  03/29/2006   EF normal; mild MR & TR; AV mildly sclerotic    Current Outpatient Medications on File Prior to Visit  Medication Sig Dispense Refill   albuterol (VENTOLIN HFA) 108 (90 Base) MCG/ACT inhaler Inhale 2 puffs into the lungs every 4 (four) hours as needed for wheezing or shortness of  breath.     aspirin EC 81 MG tablet Take 81 mg by mouth daily.     Cholecalciferol (VITAMIN D3) 50 MCG (2000 UT) capsule Take 2,000 Units by mouth daily.     ibuprofen (ADVIL) 800 MG tablet Take 800 mg by mouth 2 (two) times daily as needed.     loratadine (CLARITIN) 10 MG tablet Take 10 mg by mouth daily.     metoprolol succinate (TOPROL XL) 25 MG 24 hr tablet Take 0.5 tablets (12.5 mg total) by mouth daily. 45 tablet 3   nitroGLYCERIN (NITROSTAT) 0.4 MG SL tablet Place 1 tablet (0.4 mg total) under the tongue every 5 (five) minutes as needed for chest pain. 25 tablet 2   rosuvastatin (CRESTOR) 10 MG tablet Take 1 tablet by mouth once daily (Patient taking differently: Take 10 mg by mouth daily.) 90 tablet 0   tiZANidine (ZANAFLEX) 2 MG tablet Take 2 mg by mouth at bedtime as needed.     No current facility-administered medications on file prior to visit.    Social History   Socioeconomic History   Marital status: Married    Spouse name: Not on file   Number of children: 1   Years of education: Not on file   Highest education level: Not on file  Occupational History   Not on file  Tobacco Use   Smoking status: Every Day    Packs/day: 0.10    Years: 40.00    Pack years: 4.00    Types: Cigarettes   Smokeless tobacco: Never   Tobacco comments:    smokes 3-4 cigarettes daily ("it all depends") 10/27/13  Vaping Use   Vaping Use: Never used  Substance and Sexual Activity   Alcohol use: No   Drug use: No   Sexual activity: Not on file  Other Topics Concern   Not on file  Social History Narrative   Not on file   Social Determinants of Health   Financial Resource Strain: Not on file  Food Insecurity: Not on file  Transportation Needs: Not on file  Physical Activity: Not on file  Stress: Not on file  Social Connections: Not on file  Intimate Partner Violence: Not on file    Family History  Problem Relation Age of Onset   Heart disease Brother        x3   Stroke Father      BP (!) 184/91   Pulse (!) 51   Ht 6' (1.829 m)   Wt 164 lb 3.2 oz (74.5 kg)   BMI 22.27 kg/m   Body mass index is 22.27 kg/m.     Objective:   Physical Exam Vitals and nursing note reviewed. Exam conducted with a chaperone present.  Constitutional:      Appearance: He is well-developed.  HENT:     Head: Normocephalic and atraumatic.  Eyes:     Conjunctiva/sclera: Conjunctivae normal.     Pupils: Pupils are equal, round, and reactive to light.  Cardiovascular:  Rate and Rhythm: Normal rate and regular rhythm.  Pulmonary:     Effort: Pulmonary effort is normal.  Abdominal:     Palpations: Abdomen is soft.  Musculoskeletal:     Cervical back: Normal range of motion and neck supple.       Back:  Skin:    General: Skin is warm and dry.  Neurological:     Mental Status: He is alert and oriented to person, place, and time.     Cranial Nerves: No cranial nerve deficit.     Motor: No abnormal muscle tone.     Coordination: Coordination normal.     Deep Tendon Reflexes: Reflexes are normal and symmetric. Reflexes normal.  Psychiatric:        Behavior: Behavior normal.        Thought Content: Thought content normal.        Judgment: Judgment normal.  X-rays were done of the lumbar spine, reported separately.        Assessment & Plan:   Encounter Diagnosis  Name Primary?   Acute back pain with sciatica, left Yes   I have told him his pain is from the back not the hip.  I have shown him his X-rays.  I am concerned about HNP.  I will get MRI of the lumbar spine.  I have told him to take Aleve one bid.  I will call in prednisone dose pack and pain medicine.  Return in two weeks.  Get MRI.  Call if any problem.  Precautions discussed.  Electronically Signed Sanjuana Kava, MD 8/4/202211:17 AM

## 2020-09-15 NOTE — Telephone Encounter (Signed)
Patient wife called and wants Dr. Briscoe Burns nurse to call her back she has a question   [phone]

## 2020-09-25 DIAGNOSIS — I10 Essential (primary) hypertension: Secondary | ICD-10-CM | POA: Diagnosis not present

## 2020-09-25 DIAGNOSIS — I2510 Atherosclerotic heart disease of native coronary artery without angina pectoris: Secondary | ICD-10-CM | POA: Diagnosis not present

## 2020-09-27 ENCOUNTER — Ambulatory Visit (HOSPITAL_COMMUNITY)
Admission: RE | Admit: 2020-09-27 | Discharge: 2020-09-27 | Disposition: A | Discharge status: Home / Self Care | Payer: Medicare Other | Source: Ambulatory Visit | Attending: Orthopaedic Surgery | Admitting: Orthopaedic Surgery

## 2020-09-27 ENCOUNTER — Other Ambulatory Visit: Payer: Self-pay

## 2020-09-27 DIAGNOSIS — M5116 Intervertebral disc disorders with radiculopathy, lumbar region: Secondary | ICD-10-CM | POA: Diagnosis not present

## 2020-09-27 DIAGNOSIS — M5442 Lumbago with sciatica, left side: Secondary | ICD-10-CM | POA: Insufficient documentation

## 2020-09-27 DIAGNOSIS — M4726 Other spondylosis with radiculopathy, lumbar region: Secondary | ICD-10-CM | POA: Diagnosis not present

## 2020-09-27 DIAGNOSIS — M48061 Spinal stenosis, lumbar region without neurogenic claudication: Secondary | ICD-10-CM | POA: Diagnosis not present

## 2020-09-27 DIAGNOSIS — M4807 Spinal stenosis, lumbosacral region: Secondary | ICD-10-CM | POA: Diagnosis not present

## 2020-09-28 ENCOUNTER — Telehealth: Payer: Self-pay | Admitting: Orthopaedic Surgery

## 2020-09-28 MED ORDER — HYDROCODONE-ACETAMINOPHEN 5-325 MG PO TABS: ORAL_TABLET | ORAL | 0 refills | Status: DC

## 2020-09-28 NOTE — Telephone Encounter (Signed)
Patient requests prescription for Hydrocodone/Acetaminophen 5-325 mgs.  Qty  30  Sig: One tablet every four hours as needed for acute pain.  Limit of five days per [place].  Patient uses Product/process development scientist in [place]

## 2020-10-06 ENCOUNTER — Telehealth: Payer: Self-pay | Admitting: Orthopaedic Surgery

## 2020-10-06 NOTE — Telephone Encounter (Signed)
Done

## 2020-10-06 NOTE — Telephone Encounter (Signed)
Patient's wife Stanton Kidney, designated contact on file, along with patient on phone also, called to relay that his pain is not being helped enough with Hydrocodone, last prescribed 09/28/20, by Dr Luna Glasgow.  Michela Pitcher also has had prednisone, which he has finished. Asking if there is anything more he can do for the pain until his appointment with Dr Luna Glasgow on (next clinic day) 10/11/20. Please advise.

## 2020-10-11 ENCOUNTER — Encounter: Payer: Self-pay | Admitting: Orthopaedic Surgery

## 2020-10-11 ENCOUNTER — Other Ambulatory Visit: Payer: Self-pay

## 2020-10-11 ENCOUNTER — Ambulatory Visit: Payer: Medicare Other | Admitting: Orthopaedic Surgery

## 2020-10-11 VITALS — BP 146/97 | HR 84 | Ht 72.0 in | Wt 159.6 lb

## 2020-10-11 DIAGNOSIS — M5442 Lumbago with sciatica, left side: Secondary | ICD-10-CM

## 2020-10-11 MED ORDER — HYDROCODONE-ACETAMINOPHEN 5-325 MG PO TABS: ORAL_TABLET | ORAL | 0 refills | Status: DC

## 2020-10-11 NOTE — Progress Notes (Signed)
My back is hurting more.  He has more pain and left sided paresthesias of his lower back.  He had MRI which showed: L5-S1: Disc bulge with for right foraminal/far lateral disc osteophyte component and prominent hypertrophic facet degenerative changes resulting in narrowing of the left subarticular zone, mild right and moderate to severe left neural foraminal narrowing, likely impinging on the exiting left L5 nerve root.   IMPRESSION: 1. Moderate bilateral neural foraminal narrowing with narrowing of the bilateral subarticular zones at L4-5. 2. Moderate to severe left neural foraminal narrowing at L5-S1 with narrowing of the left subarticular zone. 3. Mild degenerative changes elsewhere in the lumbar spine without high-grade spinal canal or neural foraminal stenosis.  I have explained the findings to him.  I will have him see neurosurgeon.  I will refill pain medicine.  I have independently reviewed the MRI.      Encounter Diagnosis  Name Primary?   Acute back pain with sciatica, left Yes   To neurosurgery.  I have reviewed the [facility] web site prior to prescribing narcotic medicine for this patient.  Call if any problem.  Precautions discussed.  Electronically Signed Sanjuana Kava, MD 8/30/20229:01 AM

## 2020-10-11 NOTE — Progress Notes (Signed)
b

## 2020-10-13 DIAGNOSIS — M4726 Other spondylosis with radiculopathy, lumbar region: Secondary | ICD-10-CM | POA: Diagnosis not present

## 2020-10-13 DIAGNOSIS — I10 Essential (primary) hypertension: Secondary | ICD-10-CM | POA: Diagnosis not present

## 2020-10-14 NOTE — Telephone Encounter (Signed)
Update 10/14/20: patient/wife Robert Shaw called back to speak with Inez Catalina to give her and Dr Luna Glasgow some information. Aware Inez Catalina is out until office re-opens on Tuesday, 10/18/20, and that Dr returns on this day also.

## 2020-10-18 NOTE — Telephone Encounter (Signed)
Called patient back and was told that him and his wife wanted to thank Dr. Luna Glasgow and myself for our help in getting him seen by Neurosurgery and that he goes tomorrow (10/19/20) for injection.

## 2020-10-20 DIAGNOSIS — M5416 Radiculopathy, lumbar region: Secondary | ICD-10-CM | POA: Diagnosis not present

## 2020-10-26 DIAGNOSIS — I2510 Atherosclerotic heart disease of native coronary artery without angina pectoris: Secondary | ICD-10-CM | POA: Diagnosis not present

## 2020-10-26 DIAGNOSIS — I10 Essential (primary) hypertension: Secondary | ICD-10-CM | POA: Diagnosis not present

## 2020-11-16 DIAGNOSIS — M4726 Other spondylosis with radiculopathy, lumbar region: Secondary | ICD-10-CM | POA: Diagnosis not present

## 2020-11-22 ENCOUNTER — Other Ambulatory Visit: Payer: Self-pay | Admitting: Internal Medicine

## 2020-11-25 DIAGNOSIS — I2510 Atherosclerotic heart disease of native coronary artery without angina pectoris: Secondary | ICD-10-CM | POA: Diagnosis not present

## 2020-11-25 DIAGNOSIS — I10 Essential (primary) hypertension: Secondary | ICD-10-CM | POA: Diagnosis not present

## 2020-12-26 DIAGNOSIS — J42 Unspecified chronic bronchitis: Secondary | ICD-10-CM | POA: Diagnosis not present

## 2020-12-26 DIAGNOSIS — I10 Essential (primary) hypertension: Secondary | ICD-10-CM | POA: Diagnosis not present

## 2020-12-28 ENCOUNTER — Encounter: Payer: Self-pay | Admitting: Internal Medicine

## 2020-12-28 ENCOUNTER — Other Ambulatory Visit: Payer: Self-pay

## 2020-12-28 ENCOUNTER — Ambulatory Visit: Payer: Medicare Other | Admitting: Internal Medicine

## 2020-12-28 VITALS — BP 128/80 | HR 68 | Ht 72.0 in | Wt 168.8 lb

## 2020-12-28 DIAGNOSIS — I10 Essential (primary) hypertension: Secondary | ICD-10-CM

## 2020-12-28 DIAGNOSIS — E785 Hyperlipidemia, unspecified: Secondary | ICD-10-CM | POA: Diagnosis not present

## 2020-12-28 DIAGNOSIS — I2510 Atherosclerotic heart disease of native coronary artery without angina pectoris: Secondary | ICD-10-CM

## 2020-12-28 NOTE — Progress Notes (Signed)
OFFICE NOTE  Chief Complaint:  No complaints  Primary Care Physician: Rosita Fire, MD  HPI:  Robert Shaw is a 70 year old gentleman whom I have been following for a history of coronary disease. He had a stent in his circumflex and diagonal in 2003 and had moderate disease in the LAD and the right coronary. He continues to smoke, which I have talked to him about as a marked risk factor, and he has been working on some exercise. Unfortunately, on January 31 he had an episode when he developed chest pain, somewhat after eating with some shortness of breath, that went to both arms. He took some aspirin at home and the pain did not go away. He, therefore, presented to the emergency department. He was found to have an elevated troponin of 0.72 and underwent cardiac catheterization. That catheterization, performed by me, showed a severe discrete 95% stenosis in the mid LAD. Both the stents in the diagonal and circumflex were patent with minimal in-stent restenosis. The EF was greater than 60% with no focal wall motion abnormalities. The patient then underwent a Xience drug-eluting stent placement to the mid LAD on March 17, 2012, by Dr. Claiborne Billings. This was successful at reducing the lesion to 0% stenosis.  Since his hospitalization he has done very well. He has had no further chest pain. He was switched successfully from Plavix over to Brilinta, which he is taking with low-dose aspirin. His only complaint now is that he is having some numbness and 2 toes on his right foot. He reports he had problems with his right leg after the catheterization saying that he developed a foot drop but that has progressively gotten better over the past year.  He had no hematoma, bruit or catheterization complications that we were aware of at the time. Unfortunately, he continues to smoke, which is an ongoing risk factor.  Robert Shaw returns today for followup. He is doing well denies any chest pain or worsening shortness  of breath. He continues on aspirin and Brillinta. During his last office visit we had recommended starting Chantix for smoking cessation however he has not started that medication. He continues to smoke several cigarettes a day. He has no good reason for why he did not start the medication. He is also anticipating undergoing a switched to Medicare this year. Recent laboratory work was performed by his primary care provider which showed an impaired fasting glucose at 101 and hemoglobin A1c of 6.1 suggesting he may be prediabetic. His creatinine is 1.4. Lipid profile demonstrates total cholesterol 107, HDL 37, triglycerides 95 and LDL 51.  I saw Robert Shaw back in the office today. He is currently without any complaints. He denies any chest pain or worsening shortness of breath. Unfortunately has not been able to stop smoking but he is down to about 2 cigarettes a day. As mentioned above his last PCI was in February 2014. He remains on dual antiplatelets therapy with Brilinta, this was for non-STEMI. The data with long-term therapy of Brilinta does not go out much past 2 years therefore we may consider having him come off of this medication.  09/14/2015  Robert Shaw returns today for follow-up. He is without complaints. Overall he is doing really well. Denies any shortness of breath or chest pain. He remains physically active. He had recent cholesterol testing through his primary care provider which showed LDL less than 70. He is on Crestor 10 mg daily. Blood pressure is at goal.  10/08/2016  Robert Shaw was seen today in follow-up. This is an annual visit. Overall he is asymptomatic. He denies any chest pain or worsening shortness of breath. 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 He is a blood pressure is well-controlled today. He has not had a recent lipid check. His EKG was personally reviewed and shows stable anterolateral T-wave inversions in V3 through V6 as well as high lateral leads of 1 and aVL.  He's not had any coronary events that were aware of since 2014.  10/31/2017  Robert Shaw returns for annual visit.  Overall continues to do well.  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  Denies any chest pain or worsening shortness of breath.  He is maintained on low-dose aspirin, metoprolol and Crestor.  He is due for repeat lipid profile as his last study was a year ago.  He has not had any recent testing.  EKG personally reviewed demonstrates stable lateral T wave inversions.  10/29/2019  Robert Shaw is seen today in follow-up with his wife.  He continues to do well and is asymptomatic.  He denies chest pain or shortness of breath.  He is noted to have some bradycardia today.  He was previously on metoprolol 12-1/2 twice daily but is now been taking a full tablet at night.  Heart rates in the 40s today but he is asymptomatic.  Lipids have been well controlled with a total cholesterol 104, HDL 36, LDL 52 and triglycerides 77.  Blood pressure is at goal today as well at 120/80.  12/28/2020  Robert Shaw returns today for follow-up.  Blood pressure is excellent 128/80.  He denies any chest pain or shortness of breath.  EKG shows a normal sinus rhythm.  He still has anterolateral T wave inversions which were present previously.  He has well-controlled lipids on a statin with LDL of 59.  A1c 5.7.  Of interest his creatinine was elevated at 1.6 in July.  He says he had no knowledge of this.  He is advised to follow-up with his PCP regarding this.  PMHx:  Past Medical History:  Diagnosis Date   Bradycardia 03/18/2012   Coronary artery disease    Dyslipidemia    Family history of heart disease    History of nuclear stress test 07/13/2008   dipyridamole; normal perfusion, no ischemia, low risk    Hypertension    NSTEMI (non-ST elevated myocardial infarction) ([place])    h/o   Tobacco abuse     Past Surgical History:  Procedure Laterality Date   CARDIAC  CATHETERIZATION  04/2001   after high lateral MI, preserved LV function w/mild mid anterolateral hypokinesia, diffuse coronary irregularity with signficiant obstruction in LAD, OM1, superior branch of ramus (Dr. Margo Aye - [facility])   CARDIAC CATHETERIZATION  09/23/2001   patent stents from 06/2001 (Dr. Jackie Plum)   CARDIAC CATHETERIZATION  09/17/2002   no restenosis at prior intervention sites (Dr. Corky Downs)   CARDIAC CATHETERIZATION  09/09/2009   no significant CAD, patent stents in OM1 & LAD (Dr. Corky Downs)    COLONOSCOPY N/A 02/25/2017   Procedure: COLONOSCOPY;  Surgeon: Danie Binder, MD;  [facility];  Service: Endoscopy;  Laterality: N/A;  10:30 Am   COLONOSCOPY WITH PROPOFOL N/A 04/15/2020   Procedure: COLONOSCOPY WITH PROPOFOL;  Surgeon: Eloise Harman, DO;  [facility];  Service: Endoscopy;  Laterality: N/A;  pm ASA  II/   CORONARY ANGIOPLASTY WITH STENT PLACEMENT  06/19/2001   Cfx (3.5x94mm Zeta stent) & diagonal stenting (2.5x58mm pixel stent) w/moderate LAD disease (Dr. Corky Downs)    CORONARY ANGIOPLASTY WITH STENT PLACEMENT  03/17/2012   Xience DES (2.25x48mm) of mid-distal LAD (Dr. Corky Downs)   LEFT HEART CATHETERIZATION WITH CORONARY ANGIOGRAM N/A 03/17/2012   Procedure: LEFT HEART CATHETERIZATION WITH CORONARY ANGIOGRAM;  Surgeon: Pixie Casino, MD;  Location: [facility];  Service: Cardiovascular;  Laterality: N/A;   PERCUTANEOUS CORONARY STENT INTERVENTION (PCI-S)  03/17/2012   Procedure: PERCUTANEOUS CORONARY STENT INTERVENTION (PCI-S);  Surgeon: Pixie Casino, MD;  Location[facility];  Service: Cardiovascular;;   POLYPECTOMY  02/25/2017   Procedure: POLYPECTOMY;  Surgeon: Danie Binder, MD;  [facility];  Service: Endoscopy;;  cecal x2; ascending x4;hepatic flexure;splenic flexure; descending x2;   POLYPECTOMY  04/15/2020   Procedure: POLYPECTOMY;  Surgeon: Eloise Harman, DO;  [facility];   Service: Endoscopy;;  cecal    TRANSTHORACIC ECHOCARDIOGRAM  03/29/2006   EF normal; mild MR & TR; AV mildly sclerotic    FAMHx:  Family History  Problem Relation Age of Onset   Heart disease Brother        x3   Stroke Father     SOCHx:   reports that he has been smoking cigarettes. He has a 4.00 pack-year smoking history. He has never used smokeless tobacco. He reports that he does not drink alcohol and does not use drugs.  ALLERGIES:  No Known Allergies  ROS: Pertinent items noted in HPI and remainder of comprehensive ROS otherwise negative.  HOME MEDS: Current Outpatient Medications  Medication Sig Dispense Refill   albuterol (VENTOLIN HFA) 108 (90 Base) MCG/ACT inhaler Inhale 2 puffs into the lungs every 4 (four) hours as needed for wheezing or shortness of breath.     aspirin EC 81 MG tablet Take 81 mg by mouth daily.     Cholecalciferol (VITAMIN D3) 50 MCG (2000 UT) capsule Take 2,000 Units by mouth daily.     gabapentin (NEURONTIN) 300 MG capsule Take 300 mg by mouth 3 (three) times daily.     HYDROcodone-acetaminophen (NORCO/VICODIN) 5-325 MG tablet One tablet every six hours for pain.  Limit 7 days. 28 tablet 0   loratadine (CLARITIN) 10 MG tablet Take 10 mg by mouth daily.     metoprolol succinate (TOPROL-XL) 25 MG 24 hr tablet Take 1/2 (one-half) tablet by mouth once daily 45 tablet 0   nitroGLYCERIN (NITROSTAT) 0.4 MG SL tablet Place 1 tablet (0.4 mg total) under the tongue every 5 (five) minutes as needed for chest pain. 25 tablet 2   rosuvastatin (CRESTOR) 10 MG tablet Take 1 tablet by mouth once daily (Patient taking differently: Take 10 mg by mouth daily.) 90 tablet 0   tiZANidine (ZANAFLEX) 2 MG tablet Take 2 mg by mouth at bedtime as needed.     No current facility-administered medications for this visit.    LABS/IMAGING: No results found for this or any previous visit (from the past 48 hour(s)). No results found.  VITALS: BP 128/80   Pulse 68   Ht 6'  (1.829 m)   Wt 168 lb 12.8 oz (76.6 kg)   SpO2 100%   BMI 22.89 kg/m   EXAM: General appearance: alert and no distress Neck: no carotid bruit and no JVD Lungs: clear to auscultation bilaterally Heart: regular rate and rhythm, S1, S2 normal, no murmur, click, rub  or gallop Abdomen: soft, non-tender; bowel sounds normal; no masses,  no organomegaly Extremities: extremities normal, atraumatic, no cyanosis or edema Pulses: 2+ and symmetric Skin: Skin color, texture, turgor normal. No rashes or lesions Neurologic: Grossly normal Psych: Mood, affect normal  EKG: Normal sinus rhythm at 61, anterolateral T wave inversions-personally reviewed  ASSESSMENT: Coronary artery disease status post PCI to the mid to distal LAD with a Xience DES (03/2012) Dyslipidemia - at goal Hypertension - controlled Tobacco dependence - has Chantix, not ready to quit Anterolateral and inferior T-wave abnormalities-stable  PLAN: 1.   Robert Shaw is to be doing well with good blood pressure control.  Denies any chest pain or worsening shortness of breath.  He has T wave abnormalities which are unchanged.  Cholesterol is at goal.  No changes to his medicines today.  He was noted to have an elevated creatinine in the summer.  This should be followed up by his PCP  Follow-up annually or sooner as necessary.  Pixie Casino, MD, [facility], [place] Director of the Advanced Lipid Disorders &  Cardiovascular Risk Reduction Clinic Diplomate of the American Board of Clinical Lipidology Attending Cardiologist  Direct Dial: [phone]  Fax: [phone]  Website:  [email or web address] 12/28/2020, 4:11 PM

## 2020-12-28 NOTE — Patient Instructions (Signed)
Medication Instructions:  Continue same medications *If you need a refill on your cardiac medications before your next appointment, please call your pharmacy*   Lab Work: None ordered   Testing/Procedures: None ordered   Follow-Up: At [facility], you and your health needs are our priority.  As part of our continuing mission to provide you with exceptional heart care, we have created designated Provider Care Teams.  These Care Teams include your primary Cardiologist (physician) and Advanced Practice Providers (APPs -  Physician Assistants and Nurse Practitioners) who all work together to provide you with the care you need, when you need it.  We recommend signing up for the patient portal called "MyChart".  Sign up information is provided on this After Visit Summary.  MyChart is used to connect with patients for Virtual Visits (Telemedicine).  Patients are able to view lab/test results, encounter notes, upcoming appointments, etc.  Non-urgent messages can be sent to your provider as well.   To learn more about what you can do with MyChart, go to [email or web address].      Your next appointment:  1 year    The format for your next appointment: Office   Provider:  Dr.Hilty   

## 2021-01-14 IMAGING — CT CT CHEST LUNG CANCER SCREENING LOW DOSE W/O CM
2 of 3 series · 15 of 36 positions shown, 18 images · non-contrast
Comparison: None

CLINICAL DATA: Lung cancer screening. Fifty-two pack-year history.
Current asymptomatic smoker.

EXAM:
CT CHEST WITHOUT CONTRAST LOW-DOSE FOR LUNG CANCER SCREENING
TECHNIQUE: Multidetector CT imaging of the chest was performed following the
standard protocol without IV contrast.

[Series 2: axial st · axial · 0.69mm/px · z∈[+1240,+1530]mm · 12 of 70 slices shown, 15 images]
[im 6/70  mediastinal]
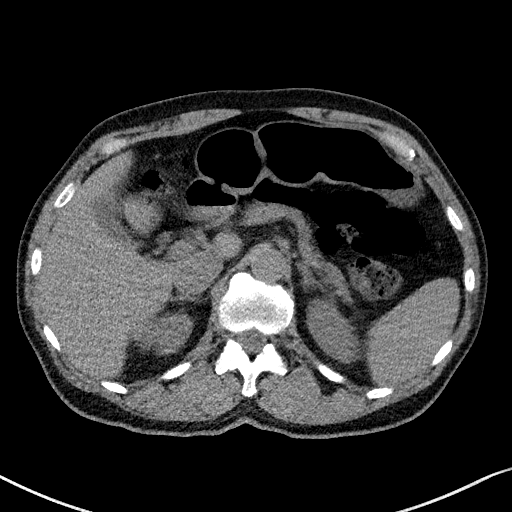
[im 6/70  lung]
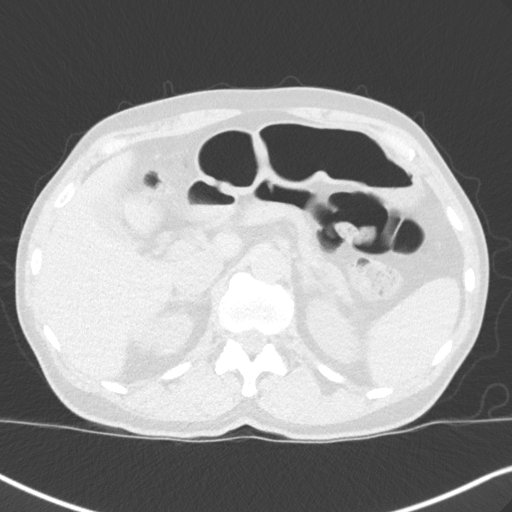
[im 11/70  lung]
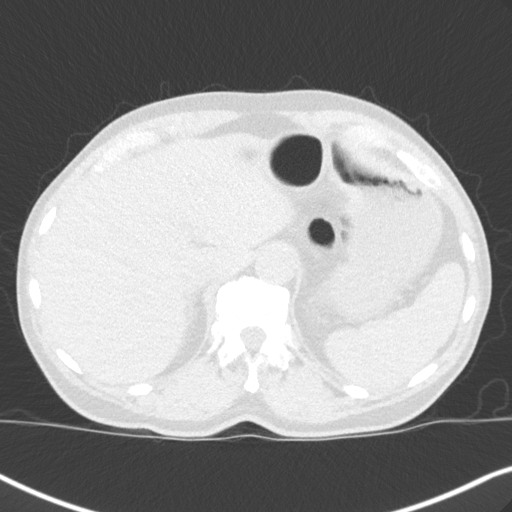
[im 16/70  lung]
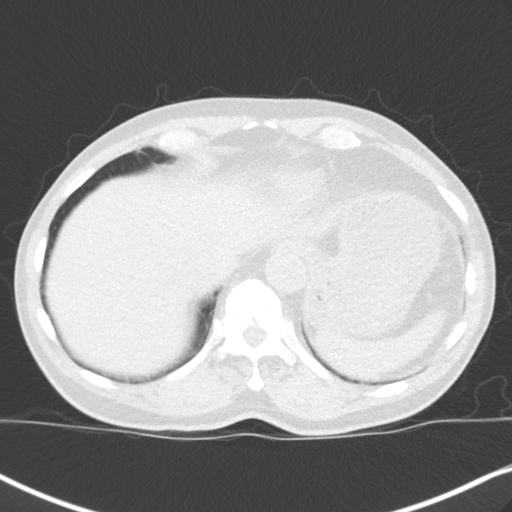
[im 21/70  lung]
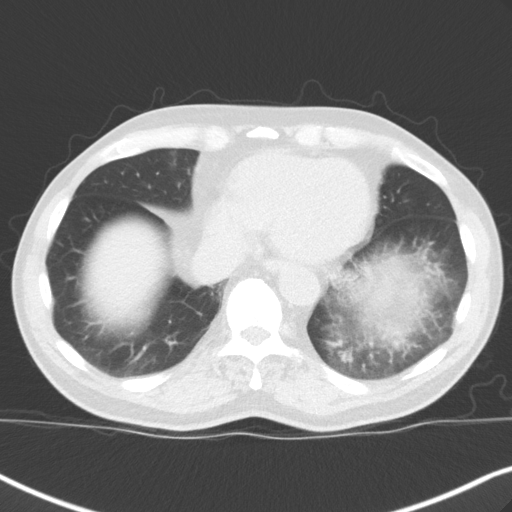
[im 26/70  mediastinal]
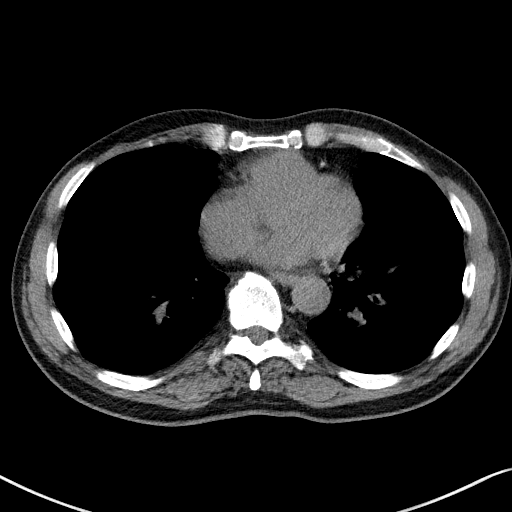
[im 26/70  lung]
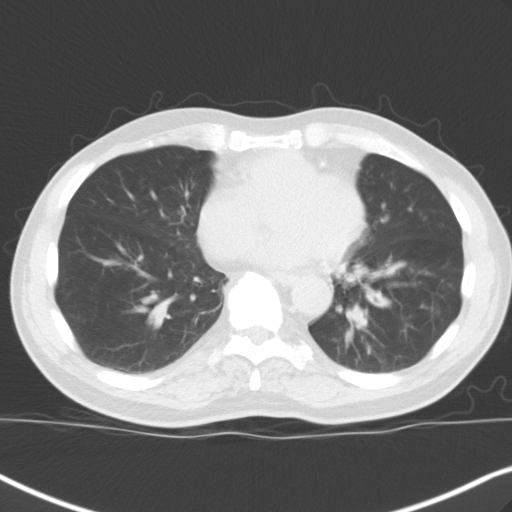
[im 31/70  lung]
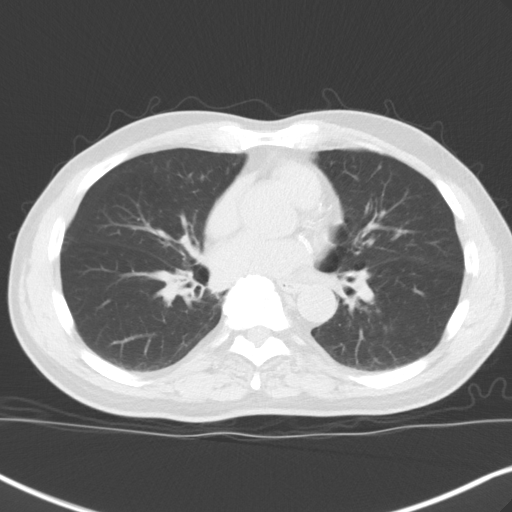
[im 39/70  lung]
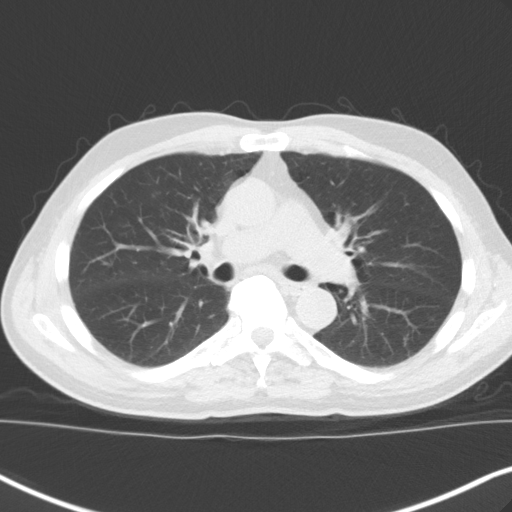
[im 44/70  lung]
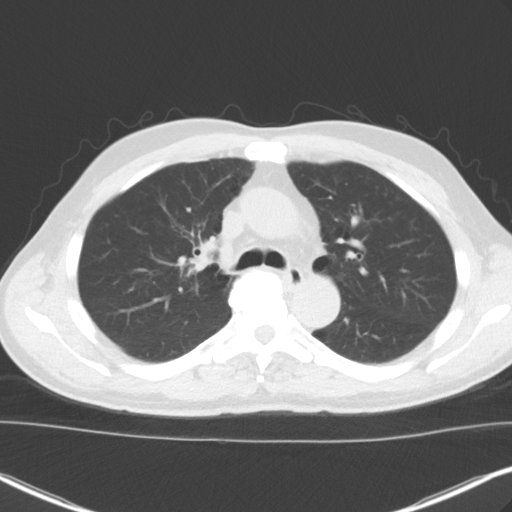
[im 49/70  mediastinal]
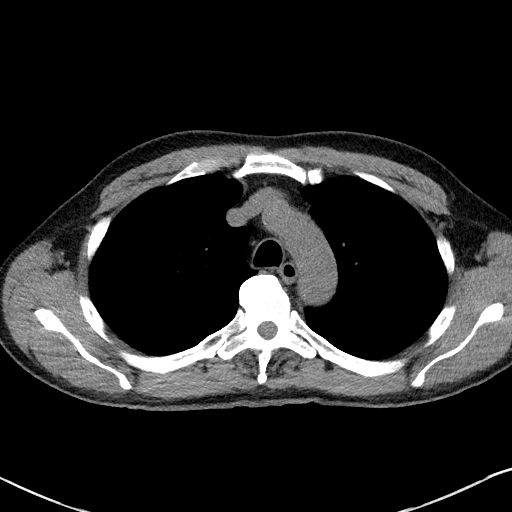
[im 49/70  lung]
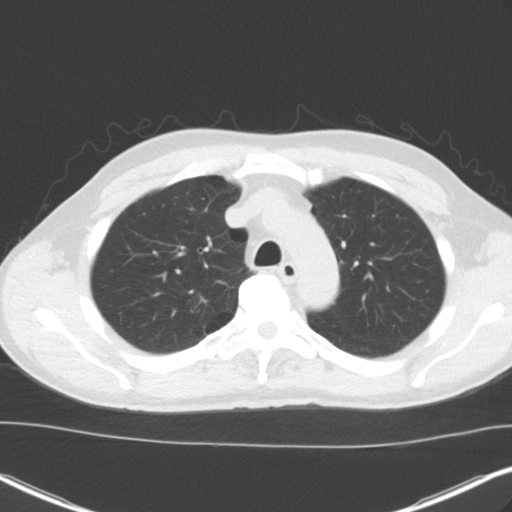
[im 54/70  lung]
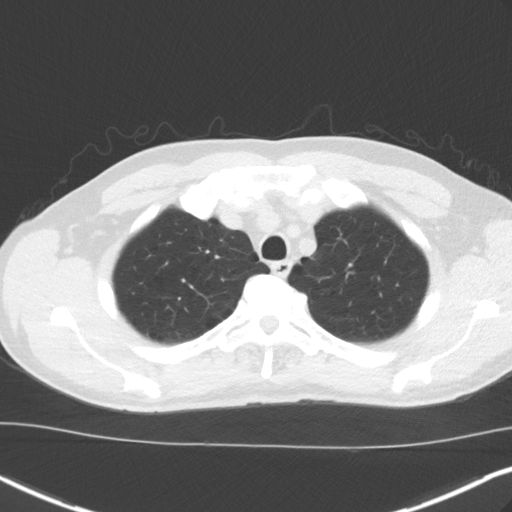
[im 59/70  lung]
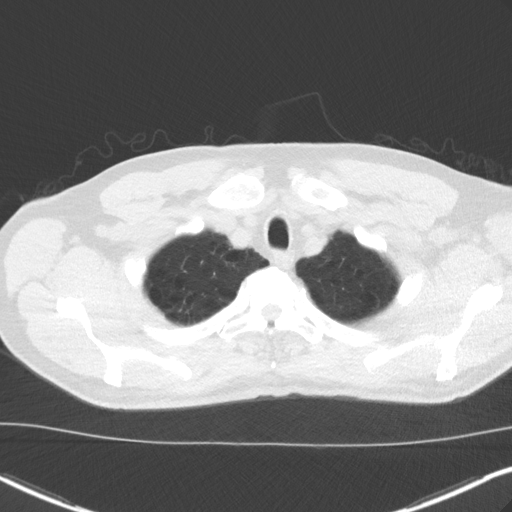
[im 64/70  lung]
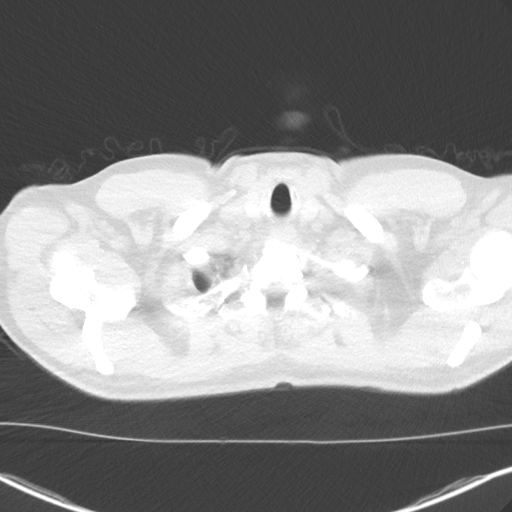

[Series 5: coronal · coronal · 0.66mm/px · 3 of 232 slices shown]
[im 47/232  lung]
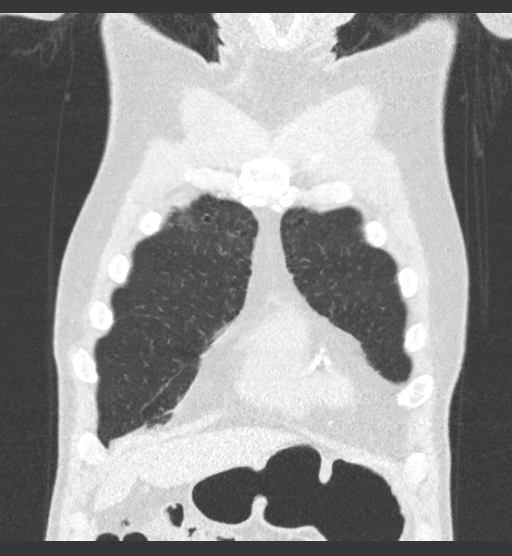
[im 93/232  lung]
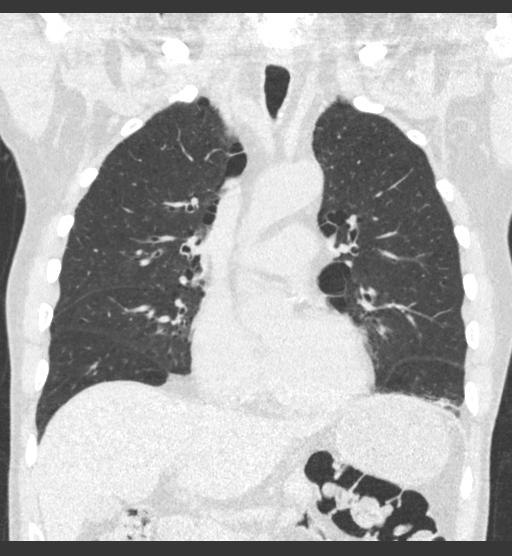
[im 139/232  lung]
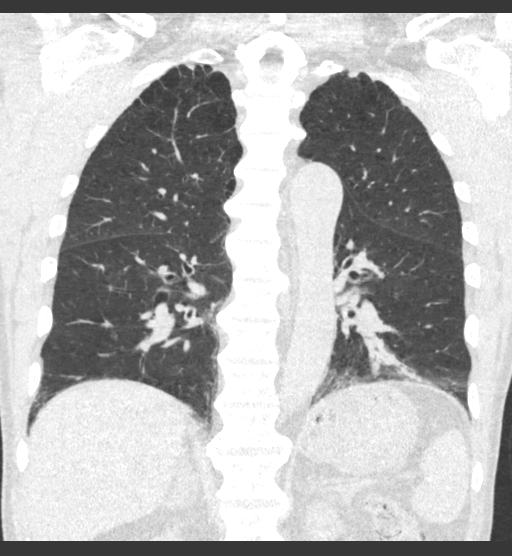

[15 of 36 positions shown; findings below may reference images not displayed]

FINDINGS: Cardiovascular: The heart size is normal. No pericardial effusion
identified. Aortic atherosclerosis and LAD and left circumflex
coronary artery calcifications.

Mediastinum/Nodes: No enlarged mediastinal, hilar, or axillary lymph
nodes. Thyroid gland, trachea, and esophagus demonstrate no
significant findings.

Lungs/Pleura: No pleural effusion identified. Moderate to advanced
paraseptal and centrilobular emphysema with diffuse bronchial wall
thickening. Mild bilateral areas of bronchiectasis identified.
Bilateral lower lobe airway opacification which may represent areas
of mucous plugging or aspirated material. Prominent left lower lobe
Peri bronchial airspace densities are identified. This limits
assessment of the central airways within affected portions of the
lungs. No obvious suspicious pulmonary nodule or mass noted
identified this tiny some aggressive pulmonary toilet.

Upper Abdomen: No acute findings within the imaged portions of the
upper abdomen.

Musculoskeletal: No chest wall mass or suspicious bone lesions
identified. Spondylosis identified throughout the thoracic spine.
IMPRESSION: 1. Lung-RADS 0s, incomplete. Additional lung cancer screening CT
images/or comparison to prior chest CT examinations is needed.
2. The S modifier above refers to bilateral lower lobe areas of
aspiration and/or peribronchial pneumonia. This limits assessment
for underlying endobronchial lesions as well as small lung nodules
surrounding the lower lobe airways. Recommend appropriate antibiotic
therapy with repeat imaging following complete resolution of any
symptoms using the CT chest LCS follow-up protocol.
3. Aortic atherosclerosis, in addition to lad and left circumflex
coronary artery disease. Please note that although the presence of
coronary artery calcium documents the presence of coronary artery
disease, the severity of this disease and any potential stenosis
cannot be assessed on this non-gated CT examination. Assessment for
potential risk factor modification, dietary therapy or pharmacologic
therapy may be warranted, if clinically indicated.

Aortic Atherosclerosis (Q2SIB-BLR.R) and Emphysema (Q2SIB-MAJ.Y).

## 2021-01-16 DIAGNOSIS — M130 Polyarthritis, unspecified: Secondary | ICD-10-CM | POA: Diagnosis not present

## 2021-01-16 DIAGNOSIS — J42 Unspecified chronic bronchitis: Secondary | ICD-10-CM | POA: Diagnosis not present

## 2021-01-16 DIAGNOSIS — F17210 Nicotine dependence, cigarettes, uncomplicated: Secondary | ICD-10-CM | POA: Diagnosis not present

## 2021-01-16 DIAGNOSIS — I10 Essential (primary) hypertension: Secondary | ICD-10-CM | POA: Diagnosis not present

## 2021-01-16 DIAGNOSIS — I2510 Atherosclerotic heart disease of native coronary artery without angina pectoris: Secondary | ICD-10-CM | POA: Diagnosis not present

## 2021-02-23 DIAGNOSIS — M545 Low back pain, unspecified: Secondary | ICD-10-CM | POA: Diagnosis not present

## 2021-02-23 DIAGNOSIS — I10 Essential (primary) hypertension: Secondary | ICD-10-CM | POA: Diagnosis not present

## 2021-02-23 DIAGNOSIS — I2510 Atherosclerotic heart disease of native coronary artery without angina pectoris: Secondary | ICD-10-CM | POA: Diagnosis not present

## 2021-03-12 ENCOUNTER — Other Ambulatory Visit: Payer: Self-pay

## 2021-03-12 ENCOUNTER — Encounter (HOSPITAL_COMMUNITY): Payer: Self-pay

## 2021-03-12 ENCOUNTER — Emergency Department (HOSPITAL_COMMUNITY)
Admission: EM | Admit: 2021-03-12 | Discharge: 2021-03-12 | Disposition: A | Discharge status: Home / Self Care | Payer: Medicare Other | Attending: Emergency Medicine | Admitting: Emergency Medicine

## 2021-03-12 DIAGNOSIS — H5712 Ocular pain, left eye: Secondary | ICD-10-CM | POA: Diagnosis present

## 2021-03-12 DIAGNOSIS — S0502XA Injury of conjunctiva and corneal abrasion without foreign body, left eye, initial encounter: Secondary | ICD-10-CM | POA: Insufficient documentation

## 2021-03-12 DIAGNOSIS — H11422 Conjunctival edema, left eye: Secondary | ICD-10-CM | POA: Diagnosis not present

## 2021-03-12 DIAGNOSIS — X58XXXA Exposure to other specified factors, initial encounter: Secondary | ICD-10-CM | POA: Diagnosis not present

## 2021-03-12 DIAGNOSIS — Z79899 Other long term (current) drug therapy: Secondary | ICD-10-CM | POA: Insufficient documentation

## 2021-03-12 DIAGNOSIS — Z7982 Long term (current) use of aspirin: Secondary | ICD-10-CM | POA: Diagnosis not present

## 2021-03-12 MED ORDER — FLUORESCEIN SODIUM 1 MG OP STRP
1.0000 | ORAL_STRIP | Freq: Once | OPHTHALMIC | Status: AC
  Administered 2021-03-12: 1 via OPHTHALMIC

## 2021-03-12 MED ORDER — CIPROFLOXACIN HCL 0.3 % OP OINT: TOPICAL_OINTMENT | Freq: Two times a day (BID) | OPHTHALMIC | 0 refills | Status: AC

## 2021-03-12 MED ORDER — TETRACAINE HCL 0.5 % OP SOLN
2.0000 [drp] | Freq: Once | OPHTHALMIC | Status: AC
  Administered 2021-03-12: 2 [drp] via OPHTHALMIC
  Filled 2021-03-12: qty 4

## 2021-03-12 MED ORDER — CIPROFLOXACIN HCL 0.3 % OP SOLN
2.0000 [drp] | Freq: Four times a day (QID) | OPHTHALMIC | Status: DC
  Filled 2021-03-12: qty 2.5

## 2021-03-12 MED ORDER — CIPROFLOXACIN HCL 0.3 % OP OINT: TOPICAL_OINTMENT | Freq: Three times a day (TID) | OPHTHALMIC | 0 refills | Status: DC

## 2021-03-12 NOTE — ED Provider Notes (Signed)
[facility] EMERGENCY DEPARTMENT Provider Note   CSN: [id number] Arrival date & time: 03/12/21  1902     History  Chief Complaint  Patient presents with   Eye Pain    Robert Shaw is a 71 y.o. male. Presents emergency department with complaint of left eye pain.  He states that yesterday he was in his usual state of health when he started having left eye pain.  Pain is worse when moving his eyes in different directions and better when he is looking straight ahead.  He also has associated eye redness.  He feels that his vision is blurry in that eye right now.  He has also had clear drainage from the eye.  He is not sure if he has had any major trauma to the eye, however he says he was scratching his eye a lot yesterday and very well could have caused this pain.  He denies any headaches.   Eye Pain Pertinent negatives include no headaches.      Home Medications Prior to Admission medications   Medication Sig Start Date End Date Taking? Authorizing Provider  albuterol (VENTOLIN HFA) 108 (90 Base) MCG/ACT inhaler Inhale 2 puffs into the lungs every 4 (four) hours as needed for wheezing or shortness of breath. 09/16/19   [provider]  aspirin EC 81 MG tablet Take 81 mg by mouth daily.    [provider]  Cholecalciferol (VITAMIN D3) 50 MCG (2000 UT) capsule Take 2,000 Units by mouth daily.    [provider]  ciprofloxacin (CILOXAN) 0.3 % ophthalmic ointment Place into the left eye 2 (two) times daily for 7 days. 03/12/21 03/19/21  Sumayyah Custodio, Adora Fridge, PA-C  gabapentin (NEURONTIN) 300 MG capsule Take 300 mg by mouth 3 (three) times daily. 10/13/20   [provider]  HYDROcodone-acetaminophen (NORCO/VICODIN) 5-325 MG tablet One tablet every six hours for pain.  Limit 7 days. 10/11/20   Sanjuana Kava, MD  loratadine (CLARITIN) 10 MG tablet Take 10 mg by mouth daily.    [provider]  metoprolol succinate (TOPROL-XL) 25 MG 24 hr tablet Take 1/2  (one-half) tablet by mouth once daily 11/23/20   Hilty, Nadean Corwin, MD  nitroGLYCERIN (NITROSTAT) 0.4 MG SL tablet Place 1 tablet (0.4 mg total) under the tongue every 5 (five) minutes as needed for chest pain. 10/08/16   Hilty, Nadean Corwin, MD  rosuvastatin (CRESTOR) 10 MG tablet Take 1 tablet by mouth once daily Patient taking differently: Take 10 mg by mouth daily. 05/20/18   Hilty, Nadean Corwin, MD  tiZANidine (ZANAFLEX) 2 MG tablet Take 2 mg by mouth at bedtime as needed. 08/25/20   [provider]      Allergies    Patient has no known allergies.    Review of Systems   Review of Systems  Eyes:  Positive for pain, discharge, redness and visual disturbance. Negative for itching.  Neurological:  Negative for headaches.  All other systems reviewed and are negative.  Physical Exam Updated Vital Signs BP 138/78 (BP Location: Left Arm)    Pulse 78    Temp 98 F (36.7 C) (Oral)    Resp 17    Ht 6' (1.829 m)    Wt 79.4 kg    SpO2 97%    BMI 23.73 kg/m  Physical Exam Vitals and nursing note reviewed.  Constitutional:      General: He is not in acute distress.    Appearance: Normal appearance. He is well-developed. He is  not ill-appearing, toxic-appearing or diaphoretic.  HENT:     Head: Normocephalic and atraumatic.     Nose: No nasal deformity.     Mouth/Throat:     Lips: Pink. No lesions.  Eyes:     General: Gaze aligned appropriately. No scleral icterus.       Right eye: No discharge.        Left eye: Discharge present.No foreign body or hordeolum.     Extraocular Movements: Extraocular movements intact.     Right eye: Normal extraocular motion and no nystagmus.     Left eye: Normal extraocular motion and no nystagmus.     Conjunctiva/sclera:     Right eye: Right conjunctiva is not injected. No exudate or hemorrhage.    Left eye: Left conjunctiva is injected. Chemosis present. No exudate or hemorrhage.    Pupils:     Left eye: Corneal abrasion present.      Comments: Left  eye has conjunctival erythema.  There is a clear to light drainage coming from the left eye.  There is also some chemosis present.  Pupils are equal round and reactive.  Pain is worse with extraocular movements and when I touch the eyelid. Corneal abrasion noted on lateral aspect of left cornea. No evidence of ulcer, herpetic lesion, or penetrating foreign body  Pulmonary:     Effort: Pulmonary effort is normal. No respiratory distress.  Skin:    General: Skin is warm and dry.  Neurological:     Mental Status: He is alert and oriented to person, place, and time.  Psychiatric:        Mood and Affect: Mood normal.        Speech: Speech normal.        Behavior: Behavior normal. Behavior is cooperative.    ED Results / Procedures / Treatments   Labs (all labs ordered are listed, but only abnormal results are displayed) Labs Reviewed - No data to display  EKG None  Radiology No results found.  Procedures Procedures    Medications Ordered in ED Medications  ciprofloxacin (CILOXAN) 0.3 % ophthalmic solution 2 drop (2 drops Left Eye Not Given 03/12/21 2110)  fluorescein ophthalmic strip 1 strip (1 strip Left Eye Given 03/12/21 2015)  tetracaine (PONTOCAINE) 0.5 % ophthalmic solution 2 drop (2 drops Left Eye Given 03/12/21 2015)    ED Course/ Medical Decision Making/ A&P                           Medical Decision Making Problems Addressed: Abrasion of left cornea, initial encounter: acute illness or injury  Amount and/or Complexity of Data Reviewed Independent Historian:     Details: ind historian  Risk Prescription drug management.   This is a 71 y.o. male who presents to the ED with left eye pain after repetitive rubbing of eye yesterday.  Doubt Acute angle glaucoma, keratitis, or other etiology. Patients pain seems superficial and no pupillary changes. No temporal ttp so doubt Giant Cell arteritis. History and exam is concerning for possible corneal injury. Vision is worse  in left eye but patient describes it as blurry and not as vision loss. Likely 2/2 to abrasion. Performed fluorescein dye stain with Woods lamp.  There appears to be a corneal abrasion on the lateral aspect of the left cornea.  No evidence of ulcer, or herpetic lesions.  Pain improved with the numbing medication.  Patient does not wear contacts. Plan to discharge home with  ophthalmic antibiotic with ophthalmology follow-up  Portions of this note were generated with Dragon dictation software. Dictation errors may occur despite best attempts at proofreading.  Final Clinical Impression(s) / ED Diagnoses Final diagnoses:  Abrasion of left cornea, initial encounter    Rx / DC Orders ED Discharge Orders          Ordered    ciprofloxacin (CILOXAN) 0.3 % ophthalmic ointment  3 times daily,   Status:  Discontinued        03/12/21 2112    ciprofloxacin (CILOXAN) 0.3 % ophthalmic ointment  2 times daily        03/12/21 2112              Sheila Oats 03/12/21 2244    Sherwood Gambler, MD 03/14/21 1725

## 2021-03-12 NOTE — ED Triage Notes (Signed)
Pt c/o left eye pain, states its constantly "tearing up". Denies any injury, does not wear contacts.

## 2021-03-12 NOTE — ED Notes (Signed)
Pt wants to leave ED now. Pt asked to please be pt as AC was bringing medication down from main pharmacy.  Pt took eye drops with them and would start regimen when got home.

## 2021-03-12 NOTE — ED Notes (Signed)
Notified edp that materials for eye check are ready at pt bedside

## 2021-03-12 NOTE — ED Notes (Signed)
Pt ambulated unassisted to vision screening  Right Eye: 20/30 Left Eye:   20/50  Pt complains of constant tear production in Left eye

## 2021-03-12 NOTE — ED Notes (Signed)
ED Provider at bedside. 

## 2021-03-12 NOTE — Discharge Instructions (Signed)
You have been seen in the ED for left eye pain. Your exam was concerning for a corneal abrasion. You should start using ciprofloxacin eye drops every 6 hours for 7-10 days. If your symptoms are not improving on their own, please follow up with the ophthalmologist, Dr. Gala Romney or return here to the Emergency department to be reevaluated.

## 2021-03-13 DIAGNOSIS — H18232 Secondary corneal edema, left eye: Secondary | ICD-10-CM | POA: Diagnosis not present

## 2021-03-13 DIAGNOSIS — H2513 Age-related nuclear cataract, bilateral: Secondary | ICD-10-CM | POA: Diagnosis not present

## 2021-03-17 DIAGNOSIS — H40013 Open angle with borderline findings, low risk, bilateral: Secondary | ICD-10-CM | POA: Diagnosis not present

## 2021-03-17 DIAGNOSIS — H20042 Secondary noninfectious iridocyclitis, left eye: Secondary | ICD-10-CM | POA: Diagnosis not present

## 2021-03-26 DIAGNOSIS — I2510 Atherosclerotic heart disease of native coronary artery without angina pectoris: Secondary | ICD-10-CM | POA: Diagnosis not present

## 2021-03-26 DIAGNOSIS — I10 Essential (primary) hypertension: Secondary | ICD-10-CM | POA: Diagnosis not present

## 2021-04-03 DIAGNOSIS — H40013 Open angle with borderline findings, low risk, bilateral: Secondary | ICD-10-CM | POA: Diagnosis not present

## 2021-04-19 DIAGNOSIS — I10 Essential (primary) hypertension: Secondary | ICD-10-CM | POA: Diagnosis not present

## 2021-04-19 DIAGNOSIS — M4726 Other spondylosis with radiculopathy, lumbar region: Secondary | ICD-10-CM | POA: Diagnosis not present

## 2021-04-24 DIAGNOSIS — I2510 Atherosclerotic heart disease of native coronary artery without angina pectoris: Secondary | ICD-10-CM | POA: Diagnosis not present

## 2021-04-24 DIAGNOSIS — I10 Essential (primary) hypertension: Secondary | ICD-10-CM | POA: Diagnosis not present

## 2021-05-25 DIAGNOSIS — I2510 Atherosclerotic heart disease of native coronary artery without angina pectoris: Secondary | ICD-10-CM | POA: Diagnosis not present

## 2021-05-25 DIAGNOSIS — I10 Essential (primary) hypertension: Secondary | ICD-10-CM | POA: Diagnosis not present

## 2021-06-05 DIAGNOSIS — H18232 Secondary corneal edema, left eye: Secondary | ICD-10-CM | POA: Diagnosis not present

## 2021-06-05 DIAGNOSIS — H2513 Age-related nuclear cataract, bilateral: Secondary | ICD-10-CM | POA: Diagnosis not present

## 2021-06-05 DIAGNOSIS — H40013 Open angle with borderline findings, low risk, bilateral: Secondary | ICD-10-CM | POA: Diagnosis not present

## 2021-06-24 DIAGNOSIS — I2510 Atherosclerotic heart disease of native coronary artery without angina pectoris: Secondary | ICD-10-CM | POA: Diagnosis not present

## 2021-06-24 DIAGNOSIS — I10 Essential (primary) hypertension: Secondary | ICD-10-CM | POA: Diagnosis not present

## 2021-07-24 ENCOUNTER — Other Ambulatory Visit (HOSPITAL_COMMUNITY): Payer: Self-pay | Admitting: Internal Medicine

## 2021-07-24 DIAGNOSIS — F17210 Nicotine dependence, cigarettes, uncomplicated: Secondary | ICD-10-CM | POA: Diagnosis not present

## 2021-07-24 DIAGNOSIS — J42 Unspecified chronic bronchitis: Secondary | ICD-10-CM | POA: Diagnosis not present

## 2021-07-24 DIAGNOSIS — Z1389 Encounter for screening for other disorder: Secondary | ICD-10-CM | POA: Diagnosis not present

## 2021-07-24 DIAGNOSIS — I2510 Atherosclerotic heart disease of native coronary artery without angina pectoris: Secondary | ICD-10-CM | POA: Diagnosis not present

## 2021-07-24 DIAGNOSIS — I10 Essential (primary) hypertension: Secondary | ICD-10-CM | POA: Diagnosis not present

## 2021-07-24 DIAGNOSIS — Z87891 Personal history of nicotine dependence: Secondary | ICD-10-CM

## 2021-07-24 DIAGNOSIS — Z0001 Encounter for general adult medical examination with abnormal findings: Secondary | ICD-10-CM | POA: Diagnosis not present

## 2021-08-23 DIAGNOSIS — I10 Essential (primary) hypertension: Secondary | ICD-10-CM | POA: Diagnosis not present

## 2021-08-23 DIAGNOSIS — I2510 Atherosclerotic heart disease of native coronary artery without angina pectoris: Secondary | ICD-10-CM | POA: Diagnosis not present

## 2021-09-01 ENCOUNTER — Ambulatory Visit (HOSPITAL_COMMUNITY)
Admission: RE | Admit: 2021-09-01 | Discharge: 2021-09-01 | Disposition: A | Discharge status: Home / Self Care | Payer: Medicare Other | Source: Ambulatory Visit | Attending: Internal Medicine | Admitting: Internal Medicine

## 2021-09-01 DIAGNOSIS — Z87891 Personal history of nicotine dependence: Secondary | ICD-10-CM | POA: Diagnosis not present

## 2021-09-01 DIAGNOSIS — F17210 Nicotine dependence, cigarettes, uncomplicated: Secondary | ICD-10-CM | POA: Diagnosis not present

## 2021-09-23 DIAGNOSIS — E782 Mixed hyperlipidemia: Secondary | ICD-10-CM | POA: Diagnosis not present

## 2021-09-23 DIAGNOSIS — I2542 Coronary artery dissection: Secondary | ICD-10-CM | POA: Diagnosis not present

## 2021-10-05 DIAGNOSIS — Z955 Presence of coronary angioplasty implant and graft: Secondary | ICD-10-CM | POA: Diagnosis not present

## 2021-10-05 DIAGNOSIS — I2510 Atherosclerotic heart disease of native coronary artery without angina pectoris: Secondary | ICD-10-CM | POA: Diagnosis not present

## 2021-10-05 DIAGNOSIS — Z0001 Encounter for general adult medical examination with abnormal findings: Secondary | ICD-10-CM | POA: Diagnosis not present

## 2021-10-05 DIAGNOSIS — I10 Essential (primary) hypertension: Secondary | ICD-10-CM | POA: Diagnosis not present

## 2021-10-05 DIAGNOSIS — H538 Other visual disturbances: Secondary | ICD-10-CM | POA: Diagnosis not present

## 2021-10-05 DIAGNOSIS — E7849 Other hyperlipidemia: Secondary | ICD-10-CM | POA: Diagnosis not present

## 2021-10-23 DIAGNOSIS — M4726 Other spondylosis with radiculopathy, lumbar region: Secondary | ICD-10-CM | POA: Diagnosis not present

## 2021-11-05 DIAGNOSIS — Z955 Presence of coronary angioplasty implant and graft: Secondary | ICD-10-CM | POA: Diagnosis not present

## 2021-11-05 DIAGNOSIS — I10 Essential (primary) hypertension: Secondary | ICD-10-CM | POA: Diagnosis not present

## 2021-12-05 DIAGNOSIS — Z955 Presence of coronary angioplasty implant and graft: Secondary | ICD-10-CM | POA: Diagnosis not present

## 2021-12-05 DIAGNOSIS — I10 Essential (primary) hypertension: Secondary | ICD-10-CM | POA: Diagnosis not present

## 2021-12-25 ENCOUNTER — Ambulatory Visit: Payer: Medicare Other | Attending: Internal Medicine | Admitting: Internal Medicine

## 2021-12-25 ENCOUNTER — Encounter: Payer: Self-pay | Admitting: Internal Medicine

## 2021-12-25 VITALS — BP 138/80 | HR 47 | Ht 72.0 in | Wt 157.4 lb

## 2021-12-25 DIAGNOSIS — I10 Essential (primary) hypertension: Secondary | ICD-10-CM

## 2021-12-25 DIAGNOSIS — E785 Hyperlipidemia, unspecified: Secondary | ICD-10-CM

## 2021-12-25 DIAGNOSIS — I2510 Atherosclerotic heart disease of native coronary artery without angina pectoris: Secondary | ICD-10-CM | POA: Diagnosis not present

## 2021-12-25 DIAGNOSIS — F17200 Nicotine dependence, unspecified, uncomplicated: Secondary | ICD-10-CM

## 2021-12-25 NOTE — Progress Notes (Signed)
OFFICE NOTE  Chief Complaint:  No complaints  Primary Care Physician: Carrolyn Meiers, MD  HPI:  Robert Shaw is a 71 year old gentleman whom I have been following for a history of coronary disease. He had a stent in his circumflex and diagonal in 2003 and had moderate disease in the LAD and the right coronary. He continues to smoke, which I have talked to him about as a marked risk factor, and he has been working on some exercise. Unfortunately, on January 31 he had an episode when he developed chest pain, somewhat after eating with some shortness of breath, that went to both arms. He took some aspirin at home and the pain did not go away. He, therefore, presented to the emergency department. He was found to have an elevated troponin of 0.72 and underwent cardiac catheterization. That catheterization, performed by me, showed a severe discrete 95% stenosis in the mid LAD. Both the stents in the diagonal and circumflex were patent with minimal in-stent restenosis. The EF was greater than 60% with no focal wall motion abnormalities. The patient then underwent a Xience drug-eluting stent placement to the mid LAD on March 17, 2012, by Dr. Claiborne Billings. This was successful at reducing the lesion to 0% stenosis.  Since his hospitalization he has done very well. He has had no further chest pain. He was switched successfully from Plavix over to Brilinta, which he is taking with low-dose aspirin. His only complaint now is that he is having some numbness and 2 toes on his right foot. He reports he had problems with his right leg after the catheterization saying that he developed a foot drop but that has progressively gotten better over the past year.  He had no hematoma, bruit or catheterization complications that we were aware of at the time. Unfortunately, he continues to smoke, which is an ongoing risk factor.  Robert Shaw returns today for followup. He is doing well denies any chest pain or worsening  shortness of breath. He continues on aspirin and Brillinta. During his last office visit we had recommended starting Chantix for smoking cessation however he has not started that medication. He continues to smoke several cigarettes a day. He has no good reason for why he did not start the medication. He is also anticipating undergoing a switched to Medicare this year. Recent laboratory work was performed by his primary care provider which showed an impaired fasting glucose at 101 and hemoglobin A1c of 6.1 suggesting he may be prediabetic. His creatinine is 1.4. Lipid profile demonstrates total cholesterol 107, HDL 37, triglycerides 95 and LDL 51.  I saw Robert Shaw back in the office today. He is currently without any complaints. He denies any chest pain or worsening shortness of breath. Unfortunately has not been able to stop smoking but he is down to about 2 cigarettes a day. As mentioned above his last PCI was in February 2014. He remains on dual antiplatelets therapy with Brilinta, this was for non-STEMI. The data with long-term therapy of Brilinta does not go out much past 2 years therefore we may consider having him come off of this medication.  09/14/2015  Robert Shaw returns today for follow-up. He is without complaints. Overall he is doing really well. Denies any shortness of breath or chest pain. He remains physically active. He had recent cholesterol testing through his primary care provider which showed LDL less than 70. He is on Crestor 10 mg daily. Blood pressure is at goal.  10/08/2016  Robert Shaw was seen today in follow-up. This is an annual visit. Overall he is asymptomatic. He denies any chest pain or worsening shortness of breath. 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 He is a blood pressure is well-controlled today. He has not had a recent lipid check. His EKG was personally reviewed and shows stable anterolateral T-wave inversions in V3 through V6 as well as high lateral leads of  1 and aVL. He's not had any coronary events that were aware of since 2014.  10/31/2017  Robert Shaw returns for annual visit.  Overall continues to do well.  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  Denies any chest pain or worsening shortness of breath.  He is maintained on low-dose aspirin, metoprolol and Crestor.  He is due for repeat lipid profile as his last study was a year ago.  He has not had any recent testing.  EKG personally reviewed demonstrates stable lateral T wave inversions.  10/29/2019  Robert Shaw is seen today in follow-up with his wife.  He continues to do well and is asymptomatic.  He denies chest pain or shortness of breath.  He is noted to have some bradycardia today.  He was previously on metoprolol 12-1/2 twice daily but is now been taking a full tablet at night.  Heart rates in the 40s today but he is asymptomatic.  Lipids have been well controlled with a total cholesterol 104, HDL 36, LDL 52 and triglycerides 77.  Blood pressure is at goal today as well at 120/80.  12/28/2020  Robert Shaw returns today for follow-up.  Blood pressure is excellent 128/80.  He denies any chest pain or shortness of breath.  EKG shows a normal sinus rhythm.  He still has anterolateral T wave inversions which were present previously.  He has well-controlled lipids on a statin with LDL of 59.  A1c 5.7.  Of interest his creatinine was elevated at 1.6 in July.  He says he had no knowledge of this.  He is advised to follow-up with his PCP regarding this.  12/25/2021  Robert Shaw is seen today in follow-up.  Overall he says he is feeling pretty well.  He Nuys any chest pain or shortness of breath.  Unfortunate continues to smoke.  Has not been able to cut back on this much.  He is blood pressure is reasonably well controlled today.  EKG shows a sinus bradycardia and unchanged from prior EKGs.  His lipids were in August showing total cholesterol 122, HDL 44, triglycerides  74 and LDL 63 which is at goal.  PMHx:  Past Medical History:  Diagnosis Date   Bradycardia 03/18/2012   Coronary artery disease    Dyslipidemia    Family history of heart disease    History of nuclear stress test 07/13/2008   dipyridamole; normal perfusion, no ischemia, low risk    Hypertension    NSTEMI (non-ST elevated myocardial infarction) ([place])    h/o   Tobacco abuse     Past Surgical History:  Procedure Laterality Date   CARDIAC CATHETERIZATION  04/2001   after high lateral MI, preserved LV function w/mild mid anterolateral hypokinesia, diffuse coronary irregularity with signficiant obstruction in LAD, OM1, superior branch of ramus (Dr. Margo Aye - [facility])   [place]  09/23/2001   patent stents from 06/2001 (Dr. Jackie Plum)   CARDIAC CATHETERIZATION  09/17/2002   no restenosis at prior intervention  sites (Dr. Corky Downs)   CARDIAC CATHETERIZATION  09/09/2009   no significant CAD, patent stents in OM1 & LAD (Dr. Corky Downs)    COLONOSCOPY N/A 02/25/2017   Procedure: COLONOSCOPY;  Surgeon: Danie Binder, MD;  [facility];  Service: Endoscopy;  Laterality: N/A;  10:30 Am   COLONOSCOPY WITH PROPOFOL N/A 04/15/2020   Procedure: COLONOSCOPY WITH PROPOFOL;  Surgeon: Eloise Harman, DO;  Location: [facility];  Service: Endoscopy;  Laterality: N/A;  pm ASA II/   CORONARY ANGIOPLASTY WITH STENT PLACEMENT  06/19/2001   Cfx (3.5x55m Zeta stent) & diagonal stenting (2.5x137mpixel stent) w/moderate LAD disease (Dr. T.Corky Downs   CORONARY ANGIOPLASTY WITH STENT PLACEMENT  03/17/2012   Xience DES (2.25x1556mof mid-distal LAD (Dr. T. Corky Downs LEFT HEART CATHETERIZATION WITH CORONARY ANGIOGRAM N/A 03/17/2012   Procedure: LEFT HEART CATHETERIZATION WITH CORONARY ANGIOGRAM;  Surgeon: KenPixie CasinoD;  [facility];  Service: Cardiovascular;  Laterality: N/A;   PERCUTANEOUS CORONARY STENT INTERVENTION (PCI-S)  03/17/2012   Procedure:  PERCUTANEOUS CORONARY STENT INTERVENTION (PCI-S);  Surgeon: KenPixie CasinoD;  [facility];  Service: Cardiovascular;;   POLYPECTOMY  02/25/2017   Procedure: POLYPECTOMY;  Surgeon: FieDanie BinderD;  [facility];  Service: Endoscopy;;  cecal x2; ascending x4;hepatic flexure;splenic flexure; descending x2;   POLYPECTOMY  04/15/2020   Procedure: POLYPECTOMY;  Surgeon: CarEloise HarmanO;  [facility]: [facility];  Service: Endoscopy;;  cecal    TRANSTHORACIC ECHOCARDIOGRAM  03/29/2006   EF normal; mild MR & TR; AV mildly sclerotic    FAMHx:  Family History  Problem Relation Age of Onset   Heart disease Brother        x3   Stroke Father     SOCHx:   reports that he has been smoking cigarettes. He has a 4.00 pack-year smoking history. He has never used smokeless tobacco. He reports that he does not drink alcohol and does not use drugs.  ALLERGIES:  No Known Allergies  ROS: Pertinent items noted in HPI and remainder of comprehensive ROS otherwise negative.  HOME MEDS: Current Outpatient Medications  Medication Sig Dispense Refill   albuterol (VENTOLIN HFA) 108 (90 Base) MCG/ACT inhaler Inhale 2 puffs into the lungs every 4 (four) hours as needed for wheezing or shortness of breath.     amLODipine (NORVASC) 5 MG tablet Take 5 mg by mouth daily.     aspirin EC 81 MG tablet Take 81 mg by mouth daily.     Cholecalciferol (VITAMIN D3) 50 MCG (2000 UT) capsule Take 2,000 Units by mouth daily.     gabapentin (NEURONTIN) 300 MG capsule Take 300 mg by mouth 3 (three) times daily.     loratadine (CLARITIN) 10 MG tablet Take 10 mg by mouth daily.     metoprolol tartrate (LOPRESSOR) 25 MG tablet Take 25 mg by mouth daily.     nitroGLYCERIN (NITROSTAT) 0.4 MG SL tablet Place 1 tablet (0.4 mg total) under the tongue every 5 (five) minutes as needed for chest pain. 25 tablet 2   rosuvastatin (CRESTOR) 10 MG tablet Take 1 tablet by mouth once daily (Patient taking  differently: Take 10 mg by mouth daily.) 90 tablet 0   tadalafil (CIALIS) 20 MG tablet SMARTSIG:1 Tablet(s) By Mouth     tiZANidine (ZANAFLEX) 2 MG tablet Take 2 mg by mouth at bedtime as needed. (Patient not taking: Reported on 12/25/2021)  No current facility-administered medications for this visit.    LABS/IMAGING: No results found for this or any previous visit (from the past 48 hour(s)). No results found.  VITALS: BP 138/80 (BP Location: Left Arm, Patient Position: Sitting)   Pulse (!) 47   Ht 6' (1.829 m)   Wt 157 lb 6.4 oz (71.4 kg)   SpO2 100%   BMI 21.35 kg/m   EXAM: General appearance: alert and no distress Neck: no carotid bruit and no JVD Lungs: clear to auscultation bilaterally Heart: regular rate and rhythm, S1, S2 normal, no murmur, click, rub or gallop Abdomen: soft, non-tender; bowel sounds normal; no masses,  no organomegaly Extremities: extremities normal, atraumatic, no cyanosis or edema Pulses: 2+ and symmetric Skin: Skin color, texture, turgor normal. No rashes or lesions Neurologic: Grossly normal Psych: Mood, affect normal  EKG: Sinus bradycardia at 47, lateral T wave changes -personally reviewed  ASSESSMENT: Coronary artery disease status post PCI to the mid to distal LAD with a Xience DES (03/2012) Dyslipidemia - at goal Hypertension - controlled Tobacco dependence - not ready to quit Anterolateral and inferior T-wave abnormalities-stable  PLAN: 1.   Robert Shaw is without symptoms at this point.  He is now about 8 to 9 years out from stenting to the distal LAD.  Cholesterols been well controlled.  His blood pressure is at goal.  He has not been able to stop smoking.  I have continue to encourage him to work on this.  Follow-up in 6 months or sooner as necessary.  Pixie Casino, MD, [facility], [place] Director of the [facility] Lipid Disorders &  Cardiovascular Risk Reduction Clinic Diplomate of the American  Board of Clinical Lipidology Attending Cardiologist  Direct Dial: [phone]  Fax: [phone]  Website:  [email or web address] 12/25/2021, 9:39 AM

## 2021-12-25 NOTE — Patient Instructions (Signed)
Medication Instructions:  The current medical regimen is effective;  continue present plan and medications.  *If you need a refill on your cardiac medications before your next appointment, please call your pharmacy*   Follow-Up: At [facility], you and your health needs are our priority.  As part of our continuing mission to provide you with exceptional heart care, we have created designated Provider Care Teams.  These Care Teams include your primary Cardiologist (physician) and Advanced Practice Providers (APPs -  Physician Assistants and Nurse Practitioners) who all work together to provide you with the care you need, when you need it.  We recommend signing up for the patient portal called "MyChart".  Sign up information is provided on this After Visit Summary.  MyChart is used to connect with patients for Virtual Visits (Telemedicine).  Patients are able to view lab/test results, encounter notes, upcoming appointments, etc.  Non-urgent messages can be sent to your provider as well.   To learn more about what you can do with MyChart, go to [email or web address].    Your next appointment:   6 month(s)  The format for your next appointment:   In Person  Provider:   Pixie Casino, MD

## 2022-01-05 DIAGNOSIS — I10 Essential (primary) hypertension: Secondary | ICD-10-CM | POA: Diagnosis not present

## 2022-01-05 DIAGNOSIS — Z955 Presence of coronary angioplasty implant and graft: Secondary | ICD-10-CM | POA: Diagnosis not present

## 2022-01-10 DIAGNOSIS — H40023 Open angle with borderline findings, high risk, bilateral: Secondary | ICD-10-CM | POA: Diagnosis not present

## 2022-01-10 DIAGNOSIS — H2513 Age-related nuclear cataract, bilateral: Secondary | ICD-10-CM | POA: Diagnosis not present

## 2022-02-04 DIAGNOSIS — I2510 Atherosclerotic heart disease of native coronary artery without angina pectoris: Secondary | ICD-10-CM | POA: Diagnosis not present

## 2022-02-04 DIAGNOSIS — I10 Essential (primary) hypertension: Secondary | ICD-10-CM | POA: Diagnosis not present

## 2022-02-19 DIAGNOSIS — M4726 Other spondylosis with radiculopathy, lumbar region: Secondary | ICD-10-CM | POA: Diagnosis not present

## 2022-02-19 DIAGNOSIS — I10 Essential (primary) hypertension: Secondary | ICD-10-CM | POA: Diagnosis not present

## 2022-02-19 DIAGNOSIS — I2510 Atherosclerotic heart disease of native coronary artery without angina pectoris: Secondary | ICD-10-CM | POA: Diagnosis not present

## 2022-02-19 DIAGNOSIS — F17210 Nicotine dependence, cigarettes, uncomplicated: Secondary | ICD-10-CM | POA: Diagnosis not present

## 2022-02-19 DIAGNOSIS — J42 Unspecified chronic bronchitis: Secondary | ICD-10-CM | POA: Diagnosis not present

## 2022-02-19 DIAGNOSIS — F17200 Nicotine dependence, unspecified, uncomplicated: Secondary | ICD-10-CM | POA: Diagnosis not present

## 2022-02-19 DIAGNOSIS — Z1389 Encounter for screening for other disorder: Secondary | ICD-10-CM | POA: Diagnosis not present

## 2022-02-19 DIAGNOSIS — Z0001 Encounter for general adult medical examination with abnormal findings: Secondary | ICD-10-CM | POA: Diagnosis not present

## 2022-03-22 DIAGNOSIS — I10 Essential (primary) hypertension: Secondary | ICD-10-CM | POA: Diagnosis not present

## 2022-03-22 DIAGNOSIS — I2510 Atherosclerotic heart disease of native coronary artery without angina pectoris: Secondary | ICD-10-CM | POA: Diagnosis not present

## 2022-04-16 DIAGNOSIS — M4726 Other spondylosis with radiculopathy, lumbar region: Secondary | ICD-10-CM | POA: Diagnosis not present

## 2022-04-20 DIAGNOSIS — I10 Essential (primary) hypertension: Secondary | ICD-10-CM | POA: Diagnosis not present

## 2022-04-20 DIAGNOSIS — I2510 Atherosclerotic heart disease of native coronary artery without angina pectoris: Secondary | ICD-10-CM | POA: Diagnosis not present

## 2022-04-24 DIAGNOSIS — M5416 Radiculopathy, lumbar region: Secondary | ICD-10-CM | POA: Diagnosis not present

## 2022-05-21 DIAGNOSIS — I2510 Atherosclerotic heart disease of native coronary artery without angina pectoris: Secondary | ICD-10-CM | POA: Diagnosis not present

## 2022-05-21 DIAGNOSIS — I10 Essential (primary) hypertension: Secondary | ICD-10-CM | POA: Diagnosis not present

## 2022-06-20 DIAGNOSIS — I10 Essential (primary) hypertension: Secondary | ICD-10-CM | POA: Diagnosis not present

## 2022-06-20 DIAGNOSIS — I2510 Atherosclerotic heart disease of native coronary artery without angina pectoris: Secondary | ICD-10-CM | POA: Diagnosis not present

## 2022-06-25 ENCOUNTER — Other Ambulatory Visit: Payer: Self-pay

## 2022-06-25 ENCOUNTER — Emergency Department (HOSPITAL_COMMUNITY): Payer: Medicare Other

## 2022-06-25 ENCOUNTER — Inpatient Hospital Stay (HOSPITAL_COMMUNITY)
Admission: EM | Admit: 2022-06-25 | Discharge: 2022-06-27 | DRG: 322 | Disposition: A | Discharge status: Home / Self Care | Payer: Medicare Other | Attending: Cardiology | Admitting: Cardiology

## 2022-06-25 ENCOUNTER — Encounter (HOSPITAL_COMMUNITY): Payer: Self-pay | Admitting: Emergency Medicine

## 2022-06-25 DIAGNOSIS — I252 Old myocardial infarction: Secondary | ICD-10-CM | POA: Diagnosis not present

## 2022-06-25 DIAGNOSIS — F17200 Nicotine dependence, unspecified, uncomplicated: Secondary | ICD-10-CM | POA: Diagnosis present

## 2022-06-25 DIAGNOSIS — Z823 Family history of stroke: Secondary | ICD-10-CM | POA: Diagnosis not present

## 2022-06-25 DIAGNOSIS — I10 Essential (primary) hypertension: Secondary | ICD-10-CM | POA: Insufficient documentation

## 2022-06-25 DIAGNOSIS — N1831 Chronic kidney disease, stage 3a: Secondary | ICD-10-CM | POA: Diagnosis not present

## 2022-06-25 DIAGNOSIS — I129 Hypertensive chronic kidney disease with stage 1 through stage 4 chronic kidney disease, or unspecified chronic kidney disease: Secondary | ICD-10-CM | POA: Diagnosis not present

## 2022-06-25 DIAGNOSIS — Z7982 Long term (current) use of aspirin: Secondary | ICD-10-CM

## 2022-06-25 DIAGNOSIS — I214 Non-ST elevation (NSTEMI) myocardial infarction: Secondary | ICD-10-CM | POA: Diagnosis not present

## 2022-06-25 DIAGNOSIS — F17210 Nicotine dependence, cigarettes, uncomplicated: Secondary | ICD-10-CM | POA: Diagnosis present

## 2022-06-25 DIAGNOSIS — R079 Chest pain, unspecified: Secondary | ICD-10-CM | POA: Diagnosis not present

## 2022-06-25 DIAGNOSIS — D72829 Elevated white blood cell count, unspecified: Secondary | ICD-10-CM | POA: Diagnosis not present

## 2022-06-25 DIAGNOSIS — N179 Acute kidney failure, unspecified: Secondary | ICD-10-CM | POA: Diagnosis not present

## 2022-06-25 DIAGNOSIS — H0520 Unspecified exophthalmos: Secondary | ICD-10-CM | POA: Diagnosis not present

## 2022-06-25 DIAGNOSIS — Z8249 Family history of ischemic heart disease and other diseases of the circulatory system: Secondary | ICD-10-CM | POA: Diagnosis not present

## 2022-06-25 DIAGNOSIS — G8929 Other chronic pain: Secondary | ICD-10-CM | POA: Diagnosis present

## 2022-06-25 DIAGNOSIS — R7303 Prediabetes: Secondary | ICD-10-CM | POA: Diagnosis not present

## 2022-06-25 DIAGNOSIS — E785 Hyperlipidemia, unspecified: Secondary | ICD-10-CM | POA: Diagnosis present

## 2022-06-25 DIAGNOSIS — I200 Unstable angina: Secondary | ICD-10-CM | POA: Diagnosis present

## 2022-06-25 DIAGNOSIS — I2510 Atherosclerotic heart disease of native coronary artery without angina pectoris: Secondary | ICD-10-CM | POA: Diagnosis present

## 2022-06-25 DIAGNOSIS — Z720 Tobacco use: Secondary | ICD-10-CM | POA: Diagnosis not present

## 2022-06-25 DIAGNOSIS — Z79899 Other long term (current) drug therapy: Secondary | ICD-10-CM | POA: Diagnosis not present

## 2022-06-25 DIAGNOSIS — Z955 Presence of coronary angioplasty implant and graft: Secondary | ICD-10-CM

## 2022-06-25 DIAGNOSIS — R9431 Abnormal electrocardiogram [ECG] [EKG]: Secondary | ICD-10-CM | POA: Insufficient documentation

## 2022-06-25 LAB — CBC
HCT: 37.5 % — ABNORMAL LOW (ref 39.0–52.0)
Hemoglobin: 13.2 g/dL (ref 13.0–17.0)
MCH: 30.6 pg (ref 26.0–34.0)
MCHC: 35.2 g/dL (ref 30.0–36.0)
MCV: 87 fL (ref 80.0–100.0)
Platelets: 204 10*3/uL (ref 150–400)
RBC: 4.31 MIL/uL (ref 4.22–5.81)
RDW: 13.1 % (ref 11.5–15.5)
WBC: 11.7 10*3/uL — ABNORMAL HIGH (ref 4.0–10.5)
nRBC: 0 % (ref 0.0–0.2)

## 2022-06-25 LAB — TROPONIN I (HIGH SENSITIVITY)
Troponin I (High Sensitivity): 28 ng/L — ABNORMAL HIGH (ref <18)
Troponin I (High Sensitivity): 2859 ng/L (ref <18)

## 2022-06-25 LAB — BASIC METABOLIC PANEL
Anion gap: 11 (ref 5–15)
BUN: 15 mg/dL (ref 8–23)
CO2: 21 mmol/L — ABNORMAL LOW (ref 22–32)
Calcium: 8.9 mg/dL (ref 8.9–10.3)
Chloride: 102 mmol/L (ref 98–111)
Creatinine, Ser: 1.56 mg/dL — ABNORMAL HIGH (ref 0.61–1.24)
GFR, Estimated: 47 mL/min — ABNORMAL LOW (ref >60)
Glucose, Bld: 128 mg/dL — ABNORMAL HIGH (ref 70–99)
Potassium: 3.6 mmol/L (ref 3.5–5.1)
Sodium: 134 mmol/L — ABNORMAL LOW (ref 135–145)

## 2022-06-25 MED ORDER — HEPARIN BOLUS VIA INFUSION
4000.0000 [IU] | Freq: Once | INTRAVENOUS | Status: AC
  Administered 2022-06-25: 4000 [IU] via INTRAVENOUS

## 2022-06-25 MED ORDER — NITROGLYCERIN 0.4 MG SL SUBL
0.4000 mg | SUBLINGUAL_TABLET | SUBLINGUAL | Status: DC | PRN
  Administered 2022-06-25: 0.4 mg via SUBLINGUAL
  Filled 2022-06-25: qty 1

## 2022-06-25 MED ORDER — ASPIRIN 81 MG PO CHEW
324.0000 mg | CHEWABLE_TABLET | Freq: Once | ORAL | Status: AC
  Administered 2022-06-25: 324 mg via ORAL
  Filled 2022-06-25: qty 4

## 2022-06-25 MED ORDER — HEPARIN (PORCINE) 25000 UT/250ML-% IV SOLN
1000.0000 [IU]/h | INTRAVENOUS | Status: DC
  Administered 2022-06-25: 850 [IU]/h via INTRAVENOUS
  Filled 2022-06-25: qty 250

## 2022-06-25 NOTE — ED Notes (Signed)
Date and time results received: 06/25/22 2119   Test: Troponin Critical Value: 2859  Name of Provider Notified: Charm Barges  Orders Received? Or Actions Taken?: N/A

## 2022-06-25 NOTE — ED Provider Notes (Signed)
[place] EMERGENCY DEPARTMENT AT [facility] Provider Note   CSN: [id number] Arrival date & time: 06/25/22  1835     History {Add pertinent medical, surgical, social history, OB history to HPI:1} Chief Complaint  Patient presents with   Chest Pain    Robert Shaw is a 72 y.o. male.  He has a history of MI, coronary disease, stents.  He follows with Dr. Rennis Golden from cardiology.  He said he began experiencing chest pain about 30 minutes ago substernal left-sided aching 6 out of 10 intensity that did not radiate.  He did feel short of breath and was diaphoretic.  He says the pain is a little bit better now maybe 3 out of 10.  He says this feels like when he had his MI.  He denies any cough or recent illness.  He does still smoke.  The history is provided by the patient and the spouse.  Chest Pain Pain location:  Substernal area and L chest Pain quality: aching   Pain radiates to:  Does not radiate Pain severity:  Moderate Onset quality:  Sudden Duration:  30 minutes Timing:  Constant Progression:  Improving Chronicity:  Recurrent Context: at rest   Relieved by:  None tried Worsened by:  Nothing Ineffective treatments:  None tried Associated symptoms: diaphoresis and shortness of breath   Associated symptoms: no abdominal pain, no cough, no fever, no heartburn, no nausea and no vomiting   Risk factors: coronary artery disease, high cholesterol, hypertension, male sex and smoking        Home Medications Prior to Admission medications   Medication Sig Start Date End Date Taking? Authorizing Provider  albuterol (VENTOLIN HFA) 108 (90 Base) MCG/ACT inhaler Inhale 2 puffs into the lungs every 4 (four) hours as needed for wheezing or shortness of breath. 09/16/19   [provider]  amLODipine (NORVASC) 5 MG tablet Take 5 mg by mouth daily. 09/27/21   [provider]  aspirin EC 81 MG tablet Take 81 mg by mouth daily.    [provider]   Cholecalciferol (VITAMIN D3) 50 MCG (2000 UT) capsule Take 2,000 Units by mouth daily.    [provider]  gabapentin (NEURONTIN) 300 MG capsule Take 300 mg by mouth 3 (three) times daily. 10/13/20   [provider]  loratadine (CLARITIN) 10 MG tablet Take 10 mg by mouth daily.    [provider]  metoprolol tartrate (LOPRESSOR) 25 MG tablet Take 25 mg by mouth daily. 09/27/21   [provider]  nitroGLYCERIN (NITROSTAT) 0.4 MG SL tablet Place 1 tablet (0.4 mg total) under the tongue every 5 (five) minutes as needed for chest pain. 10/08/16   Hilty, Lisette Abu, MD  rosuvastatin (CRESTOR) 10 MG tablet Take 1 tablet by mouth once daily Patient taking differently: Take 10 mg by mouth daily. 05/20/18   Hilty, Lisette Abu, MD  tadalafil (CIALIS) 20 MG tablet SMARTSIG:1 Tablet(s) By Mouth 10/21/21   [provider]  tiZANidine (ZANAFLEX) 2 MG tablet Take 2 mg by mouth at bedtime as needed. Patient not taking: Reported on 12/25/2021 08/25/20   [provider]      Allergies    Patient has no known allergies.    Review of Systems   Review of Systems  Constitutional:  Positive for diaphoresis. Negative for fever.  Respiratory:  Positive for shortness of breath. Negative for cough.   Cardiovascular:  Positive for chest pain.  Gastrointestinal:  Negative for abdominal pain, heartburn,  nausea and vomiting.    Physical Exam Updated Vital Signs BP (!) 158/85   Pulse 77   Temp 98.1 F (36.7 C) (Oral)   Resp (!) 26   Ht 6' (1.829 m)   Wt 71.4 kg   SpO2 99%   BMI 21.35 kg/m  Physical Exam Vitals and nursing note reviewed.  Constitutional:      General: He is not in acute distress.    Appearance: He is well-developed.  HENT:     Head: Normocephalic and atraumatic.  Eyes:     Conjunctiva/sclera: Conjunctivae normal.  Cardiovascular:     Rate and Rhythm: Normal rate and regular rhythm.     Pulses:          Radial pulses are 2+ on the right side  and 2+ on the left side.       Dorsalis pedis pulses are 2+ on the right side and 2+ on the left side.       Posterior tibial pulses are 2+ on the right side and 2+ on the left side.     Heart sounds: Normal heart sounds. No murmur heard. Pulmonary:     Effort: Pulmonary effort is normal. No respiratory distress.     Breath sounds: Normal breath sounds.  Abdominal:     Palpations: Abdomen is soft.     Tenderness: There is no abdominal tenderness.  Musculoskeletal:        General: No swelling.     Cervical back: Neck supple.     Right lower leg: No tenderness. No edema.     Left lower leg: No tenderness. No edema.  Skin:    General: Skin is warm and dry.     Capillary Refill: Capillary refill takes less than 2 seconds.  Neurological:     General: No focal deficit present.     Mental Status: He is alert.  Psychiatric:        Mood and Affect: Mood normal.     ED Results / Procedures / Treatments   Labs (all labs ordered are listed, but only abnormal results are displayed) Labs Reviewed  BASIC METABOLIC PANEL  CBC  TROPONIN I (HIGH SENSITIVITY)    EKG EKG Interpretation  Date/Time:  Monday Jun 25 2022 18:48:57 EDT Ventricular Rate:  81 PR Interval:  168 QRS Duration: 86 QT Interval:  388 QTC Calculation: 451 R Axis:   1 Text Interpretation: Sinus rhythm Probable left atrial enlargement no acute ST/Ts Confirmed by Meridee Score [phone]) on 06/25/2022 7:01:39 PM  Radiology No results found.  Procedures Procedures  {Document cardiac monitor, telemetry assessment procedure when appropriate:1}  Medications Ordered in ED Medications  nitroGLYCERIN (NITROSTAT) SL tablet 0.4 mg (has no administration in time range)  aspirin chewable tablet 324 mg (has no administration in time range)    ED Course/ Medical Decision Making/ A&P   {   Click here for ABCD2, HEART and other calculatorsREFRESH Note before signing :1}                          Medical Decision  Making Amount and/or Complexity of Data Reviewed Radiology: ordered.  Risk OTC drugs. Prescription drug management.   This patient complains of ***; this involves an extensive number of treatment Options and is a complaint that carries with it a high risk of complications and morbidity. The differential includes ***  I ordered, reviewed and interpreted labs, which included *** I ordered medication *** and  reviewed PMP when indicated. I ordered imaging studies which included *** and I independently    visualized and interpreted imaging which showed *** Additional history obtained from *** Previous records obtained and reviewed *** I consulted *** and discussed lab and imaging findings and discussed disposition.  Cardiac monitoring reviewed, *** Social determinants considered, *** Critical Interventions: ***  After the interventions stated above, I reevaluated the patient and found *** Admission and further testing considered, ***   {Document critical care time when appropriate:1} {Document review of labs and clinical decision tools ie heart score, Chads2Vasc2 etc:1}  {Document your independent review of radiology images, and any outside records:1} {Document your discussion with family members, caretakers, and with consultants:1} {Document social determinants of health affecting pt's care:1} {Document your decision making why or why not admission, treatments were needed:1} Final Clinical Impression(s) / ED Diagnoses Final diagnoses:  None    Rx / DC Orders ED Discharge Orders     None

## 2022-06-25 NOTE — Progress Notes (Signed)
ANTICOAGULATION CONSULT NOTE - Initial Consult  Pharmacy Consult for heparin Indication: chest pain/ACS  No Known Allergies  Patient Measurements: Height: 6' (182.9 cm) Weight: 71.4 kg (157 lb 6.5 oz) IBW/kg (Calculated) : 77.6 Heparin Dosing Weight: 71 kg  Vital Signs: Temp: 98.1 F (36.7 C) (05/13 1854) Temp Source: Oral (05/13 1854) BP: 120/75 (05/13 2030) Pulse Rate: 77 (05/13 2030)  Labs: Recent Labs    06/25/22 1916 06/25/22 2042  HGB 13.2  --   HCT 37.5*  --   PLT 204  --   CREATININE 1.56*  --   TROPONINIHS 28* 2,859*    Estimated Creatinine Clearance: 44.5 mL/min (A) (by C-G formula based on SCr of 1.56 mg/dL (H)).   Medical History: Past Medical History:  Diagnosis Date   Bradycardia 03/18/2012   Coronary artery disease    Dyslipidemia    Family history of heart disease    History of nuclear stress test 07/13/2008   dipyridamole; normal perfusion, no ischemia, low risk    Hypertension    NSTEMI (non-ST elevated myocardial infarction) (HCC)    h/o   Tobacco abuse     Medications:  (Not in a hospital admission)   Assessment: Pharmacy consulted to dose heparin in patient with chest pain/ACS.  Patient is not on anticoagulation prior to admission.  CBC WNL Trop 2859  Goal of Therapy:  Heparin level 0.3-0.7 units/ml Monitor platelets by anticoagulation protocol: Yes   Plan:  Give 4000 units bolus x 1 Start heparin infusion at 850 units/hr Check anti-Xa level in 8 hours and daily while on heparin Continue to monitor H&H and platelets  Judeth Cornfield, PharmD Clinical Pharmacist 06/25/2022 9:36 PM

## 2022-06-25 NOTE — ED Notes (Addendum)
Pt pain was a 3/10 before nitro tablet given. After nitro tablet pt is now pain free.

## 2022-06-25 NOTE — ED Triage Notes (Signed)
Pt reports chest pain 30 mins ago, pain does not radiate, sweating, mild SOB. Pt has cardiac hx, myocardial infarction 20 years ago, stents were placed.

## 2022-06-26 ENCOUNTER — Inpatient Hospital Stay (HOSPITAL_COMMUNITY): Payer: Medicare Other

## 2022-06-26 ENCOUNTER — Inpatient Hospital Stay (HOSPITAL_COMMUNITY)
Admission: EM | Disposition: A | Discharge status: Home / Self Care | Payer: Self-pay | Source: Home / Self Care | Attending: Cardiology

## 2022-06-26 DIAGNOSIS — N1831 Chronic kidney disease, stage 3a: Secondary | ICD-10-CM

## 2022-06-26 DIAGNOSIS — I2510 Atherosclerotic heart disease of native coronary artery without angina pectoris: Secondary | ICD-10-CM

## 2022-06-26 DIAGNOSIS — I214 Non-ST elevation (NSTEMI) myocardial infarction: Secondary | ICD-10-CM

## 2022-06-26 DIAGNOSIS — R079 Chest pain, unspecified: Secondary | ICD-10-CM

## 2022-06-26 DIAGNOSIS — Z720 Tobacco use: Secondary | ICD-10-CM

## 2022-06-26 DIAGNOSIS — I200 Unstable angina: Secondary | ICD-10-CM | POA: Diagnosis present

## 2022-06-26 DIAGNOSIS — I10 Essential (primary) hypertension: Secondary | ICD-10-CM

## 2022-06-26 HISTORY — PX: LEFT HEART CATH AND CORONARY ANGIOGRAPHY: CATH118249

## 2022-06-26 HISTORY — PX: CORONARY STENT INTERVENTION: CATH118234

## 2022-06-26 LAB — CBC
HCT: 37.6 % — ABNORMAL LOW (ref 39.0–52.0)
Hemoglobin: 12.9 g/dL — ABNORMAL LOW (ref 13.0–17.0)
MCH: 30.1 pg (ref 26.0–34.0)
MCHC: 34.3 g/dL (ref 30.0–36.0)
MCV: 87.6 fL (ref 80.0–100.0)
Platelets: 220 10*3/uL (ref 150–400)
RBC: 4.29 MIL/uL (ref 4.22–5.81)
RDW: 13.4 % (ref 11.5–15.5)
WBC: 10.7 10*3/uL — ABNORMAL HIGH (ref 4.0–10.5)
nRBC: 0 % (ref 0.0–0.2)

## 2022-06-26 LAB — ECHOCARDIOGRAM COMPLETE
AV Mean grad: 3 mmHg
AV Peak grad: 5.6 mmHg
Ao pk vel: 1.18 m/s
Area-P 1/2: 2.32 cm2
Height: 72 in
S' Lateral: 2.6 cm
Weight: 2276.8 oz

## 2022-06-26 LAB — BASIC METABOLIC PANEL
Anion gap: 12 (ref 5–15)
BUN: 14 mg/dL (ref 8–23)
CO2: 19 mmol/L — ABNORMAL LOW (ref 22–32)
Calcium: 9.1 mg/dL (ref 8.9–10.3)
Chloride: 104 mmol/L (ref 98–111)
Creatinine, Ser: 1.31 mg/dL — ABNORMAL HIGH (ref 0.61–1.24)
GFR, Estimated: 59 mL/min — ABNORMAL LOW (ref >60)
Glucose, Bld: 97 mg/dL (ref 70–99)
Potassium: 3.8 mmol/L (ref 3.5–5.1)
Sodium: 135 mmol/L (ref 135–145)

## 2022-06-26 LAB — HEPARIN LEVEL (UNFRACTIONATED): Heparin Unfractionated: 0.22 IU/mL — ABNORMAL LOW (ref 0.30–0.70)

## 2022-06-26 LAB — TROPONIN I (HIGH SENSITIVITY)
Troponin I (High Sensitivity): 16211 ng/L (ref <18)
Troponin I (High Sensitivity): 16403 ng/L (ref <18)

## 2022-06-26 LAB — HEMOGLOBIN A1C
Hgb A1c MFr Bld: 5.9 % — ABNORMAL HIGH (ref 4.8–5.6)
Mean Plasma Glucose: 122.63 mg/dL

## 2022-06-26 LAB — HIV ANTIBODY (ROUTINE TESTING W REFLEX): HIV Screen 4th Generation wRfx: NONREACTIVE

## 2022-06-26 LAB — BRAIN NATRIURETIC PEPTIDE: B Natriuretic Peptide: 91 pg/mL (ref 0.0–100.0)

## 2022-06-26 SURGERY — LEFT HEART CATH AND CORONARY ANGIOGRAPHY
Anesthesia: LOCAL

## 2022-06-26 MED ORDER — NITROGLYCERIN 1 MG/10 ML FOR IR/CATH LAB
INTRA_ARTERIAL | Status: AC
  Filled 2022-06-26: qty 10

## 2022-06-26 MED ORDER — SODIUM CHLORIDE 0.9 % IV SOLN: 250.0000 mL | INTRAVENOUS | Status: DC | PRN

## 2022-06-26 MED ORDER — SODIUM CHLORIDE 0.9% FLUSH
3.0000 mL | Freq: Two times a day (BID) | INTRAVENOUS | Status: DC
  Administered 2022-06-27 (×2): 3 mL via INTRAVENOUS

## 2022-06-26 MED ORDER — FENTANYL CITRATE (PF) 100 MCG/2ML IJ SOLN
INTRAMUSCULAR | Status: DC | PRN
  Administered 2022-06-26: 25 ug via INTRAVENOUS

## 2022-06-26 MED ORDER — CLOPIDOGREL BISULFATE 75 MG PO TABS
75.0000 mg | ORAL_TABLET | Freq: Every day | ORAL | Status: DC
  Administered 2022-06-27: 75 mg via ORAL
  Filled 2022-06-26: qty 1

## 2022-06-26 MED ORDER — MIDAZOLAM HCL 2 MG/2ML IJ SOLN
INTRAMUSCULAR | Status: DC | PRN
  Administered 2022-06-26: 1 mg via INTRAVENOUS

## 2022-06-26 MED ORDER — FAMOTIDINE IN NACL 20-0.9 MG/50ML-% IV SOLN
INTRAVENOUS | Status: AC | PRN
  Administered 2022-06-26: 20 mg via INTRAVENOUS

## 2022-06-26 MED ORDER — HEPARIN SODIUM (PORCINE) 1000 UNIT/ML IJ SOLN
INTRAMUSCULAR | Status: DC | PRN
  Administered 2022-06-26 (×2): 3500 [IU] via INTRAVENOUS

## 2022-06-26 MED ORDER — SODIUM CHLORIDE 0.9% FLUSH: 3.0000 mL | INTRAVENOUS | Status: DC | PRN

## 2022-06-26 MED ORDER — SODIUM CHLORIDE 0.9% FLUSH
3.0000 mL | Freq: Two times a day (BID) | INTRAVENOUS | Status: DC
  Administered 2022-06-26 – 2022-06-27 (×2): 3 mL via INTRAVENOUS

## 2022-06-26 MED ORDER — CLOPIDOGREL BISULFATE 300 MG PO TABS
ORAL_TABLET | ORAL | Status: AC
  Filled 2022-06-26: qty 2

## 2022-06-26 MED ORDER — ROSUVASTATIN CALCIUM 20 MG PO TABS
40.0000 mg | ORAL_TABLET | Freq: Every day | ORAL | Status: DC
  Administered 2022-06-27: 40 mg via ORAL
  Filled 2022-06-26: qty 2

## 2022-06-26 MED ORDER — ALBUTEROL SULFATE (2.5 MG/3ML) 0.083% IN NEBU: 3.0000 mL | INHALATION_SOLUTION | RESPIRATORY_TRACT | Status: DC | PRN

## 2022-06-26 MED ORDER — ASPIRIN 81 MG PO CHEW
81.0000 mg | CHEWABLE_TABLET | ORAL | Status: AC
  Administered 2022-06-26: 81 mg via ORAL
  Filled 2022-06-26: qty 1

## 2022-06-26 MED ORDER — SODIUM CHLORIDE 0.9 % WEIGHT BASED INFUSION
1.0000 mL/kg/h | INTRAVENOUS | Status: AC
  Administered 2022-06-26: 1 mL/kg/h via INTRAVENOUS

## 2022-06-26 MED ORDER — HEPARIN (PORCINE) IN NACL 1000-0.9 UT/500ML-% IV SOLN
INTRAVENOUS | Status: DC | PRN
  Administered 2022-06-26 (×2): 500 mL

## 2022-06-26 MED ORDER — ACETAMINOPHEN 325 MG PO TABS: 650.0000 mg | ORAL_TABLET | ORAL | Status: DC | PRN

## 2022-06-26 MED ORDER — FAMOTIDINE IN NACL 20-0.9 MG/50ML-% IV SOLN
INTRAVENOUS | Status: AC
  Filled 2022-06-26: qty 50

## 2022-06-26 MED ORDER — METOPROLOL TARTRATE 12.5 MG HALF TABLET
12.5000 mg | ORAL_TABLET | Freq: Two times a day (BID) | ORAL | Status: DC
  Administered 2022-06-27: 12.5 mg via ORAL
  Filled 2022-06-26: qty 1

## 2022-06-26 MED ORDER — HYDRALAZINE HCL 20 MG/ML IJ SOLN: 10.0000 mg | INTRAMUSCULAR | Status: AC | PRN

## 2022-06-26 MED ORDER — LORATADINE 10 MG PO TABS
10.0000 mg | ORAL_TABLET | Freq: Every day | ORAL | Status: DC
  Administered 2022-06-27: 10 mg via ORAL
  Filled 2022-06-26: qty 1

## 2022-06-26 MED ORDER — FENTANYL CITRATE (PF) 100 MCG/2ML IJ SOLN
INTRAMUSCULAR | Status: AC
  Filled 2022-06-26: qty 2

## 2022-06-26 MED ORDER — MIDAZOLAM HCL 2 MG/2ML IJ SOLN
INTRAMUSCULAR | Status: AC
  Filled 2022-06-26: qty 2

## 2022-06-26 MED ORDER — SODIUM CHLORIDE 0.9 % IV SOLN: INTRAVENOUS | Status: DC

## 2022-06-26 MED ORDER — CLOPIDOGREL BISULFATE 300 MG PO TABS
ORAL_TABLET | ORAL | Status: DC | PRN
  Administered 2022-06-26: 600 mg via ORAL

## 2022-06-26 MED ORDER — ASPIRIN 81 MG PO TBEC
81.0000 mg | DELAYED_RELEASE_TABLET | Freq: Every day | ORAL | Status: DC
  Administered 2022-06-27: 81 mg via ORAL
  Filled 2022-06-26: qty 1

## 2022-06-26 MED ORDER — ONDANSETRON HCL 4 MG/2ML IJ SOLN: 4.0000 mg | Freq: Four times a day (QID) | INTRAMUSCULAR | Status: DC | PRN

## 2022-06-26 MED ORDER — LIDOCAINE HCL (PF) 1 % IJ SOLN
INTRAMUSCULAR | Status: DC | PRN
  Administered 2022-06-26: 2 mL via INTRADERMAL

## 2022-06-26 MED ORDER — GABAPENTIN 300 MG PO CAPS: 300.0000 mg | ORAL_CAPSULE | Freq: Three times a day (TID) | ORAL | Status: DC | PRN

## 2022-06-26 MED ORDER — VERAPAMIL HCL 2.5 MG/ML IV SOLN
INTRAVENOUS | Status: DC | PRN
  Administered 2022-06-26: 10 mL via INTRA_ARTERIAL

## 2022-06-26 MED ORDER — SODIUM CHLORIDE 0.9 % IV SOLN: INTRAVENOUS | Status: AC

## 2022-06-26 MED ORDER — NITROGLYCERIN 0.4 MG SL SUBL: 0.4000 mg | SUBLINGUAL_TABLET | SUBLINGUAL | Status: DC | PRN

## 2022-06-26 MED ORDER — ASPIRIN 81 MG PO TBEC: 81.0000 mg | DELAYED_RELEASE_TABLET | Freq: Every day | ORAL | Status: DC

## 2022-06-26 MED ORDER — ENOXAPARIN SODIUM 40 MG/0.4ML IJ SOSY
40.0000 mg | PREFILLED_SYRINGE | INTRAMUSCULAR | Status: DC
  Administered 2022-06-27: 40 mg via SUBCUTANEOUS
  Filled 2022-06-26: qty 0.4

## 2022-06-26 SURGICAL SUPPLY — 18 items
BALL SAPPHIRE NC24 2.25X12 (BALLOONS) ×1
BALLN EMERGE MR 2.0X12 (BALLOONS) ×1
BALLOON EMERGE MR 2.0X12 (BALLOONS) IMPLANT
BALLOON SAPPHIRE NC24 2.25X12 (BALLOONS) IMPLANT
CATH 5FR JL3.5 JR4 ANG PIG MP (CATHETERS) IMPLANT
CATH VISTA GUIDE 6FR XBLAD3.5 (CATHETERS) IMPLANT
DEVICE RAD COMP TR BAND LRG (VASCULAR PRODUCTS) IMPLANT
GLIDESHEATH SLEND SS 6F .021 (SHEATH) IMPLANT
GUIDEWIRE INQWIRE 1.5J.035X260 (WIRE) IMPLANT
INQWIRE 1.5J .035X260CM (WIRE) ×1
KIT ENCORE 26 ADVANTAGE (KITS) IMPLANT
KIT HEART LEFT (KITS) ×1 IMPLANT
PACK CARDIAC CATHETERIZATION (CUSTOM PROCEDURE TRAY) ×1 IMPLANT
STENT SYNERGY XD 2.25X12 (Permanent Stent) IMPLANT
SYNERGY XD 2.25X12 (Permanent Stent) ×1 IMPLANT
TRANSDUCER W/STOPCOCK (MISCELLANEOUS) ×1 IMPLANT
TUBING CIL FLEX 10 FLL-RA (TUBING) ×1 IMPLANT
WIRE ASAHI PROWATER 180CM (WIRE) IMPLANT

## 2022-06-26 NOTE — Progress Notes (Signed)
  Echocardiogram 2D Echocardiogram has been performed.  Robert Shaw Wynn Banker 06/26/2022, 11:54 AM

## 2022-06-26 NOTE — ED Notes (Signed)
Date and time results received: 06/26/22 0210   Test: Troponin Critical Value: 16211  Name of Provider Notified: [place]  Orders Received? Or Actions Taken?: NA

## 2022-06-26 NOTE — H&P (Signed)
Cardiology History and Physical   Patient ID: Robert Shaw MRN: [id number]; DOB: [birth year]  Admit date: 06/25/2022 Date of Consult: 06/26/2022  PCP:  Benetta Spar, MD   [facility] Providers Cardiologist:  Chrystie Nose, MD   Patient Profile:   Robert Shaw is a 72 y.o. male with a hx of CAD with prior PCI, tobacco abuse, HTN, HLD, and bradycardia who is being seen 06/26/2022 for the evaluation of NSTEMI at the request of Dr. Charm Barges.  History of Present Illness:   [name] has a cardiac history significant for prior PCI. Per prior notes, he has stents in his LCX and diagonal (2003) with moderate residual disease in his LAD and RCA. LHC 03/17/12 resulted in DES to mid LAD. He was last seen by Dr. Rennis Golden 12/2021 and was doing well at that time with cholesterol at goal. He continued to smoke cigarettes. He does have some bradycardia with BB, but this has been asymptomatic.   He presented to [facility] with chest pain found to have an up-trending troponin, Cardiology was consulted.   He reports ongoing SOB for about 1 year. He states that yesterday while taking a battery out of a tractor and carrying it uphill he became fatigued/tired, which usually doesn't happen. 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 Yesterday evening at about 6pm he developed left sided chest pressure reminiscent of prior angina leading to heart catheterizations. He reports diaphoresis and shortness of breath, no radiation, no nausea. This prompted evaluation at [facility] who trended troponin. Given NSTEMI, he was transferred to [facility] for further management. He has had chest pressure during this admission, last CP was when riding in the ambulance. He is found resting comfortably, no current chest pain. He continues to smoke cigarettes.  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 Baxter Hire - [facility])   CARDIAC CATHETERIZATION  09/23/2001   patent stents from 06/2001 (Dr. Evlyn Courier)   CARDIAC CATHETERIZATION  09/17/2002   no restenosis at prior intervention sites (Dr. Bishop Limbo)   CARDIAC CATHETERIZATION  09/09/2009   no significant CAD, patent stents in OM1 & LAD (Dr. Bishop Limbo)    COLONOSCOPY N/A 02/25/2017   Procedure: COLONOSCOPY;  Surgeon: West Bali, MD;  [facility];  Service: Endoscopy;  Laterality: N/A;  10:30 Am   COLONOSCOPY WITH PROPOFOL N/A 04/15/2020   Procedure: COLONOSCOPY WITH PROPOFOL;  Surgeon: Lanelle Bal, DO;  Location: [facility];  Service: Endoscopy;  Laterality: N/A;  pm ASA II/   CORONARY ANGIOPLASTY WITH STENT PLACEMENT  06/19/2001   Cfx (3.5x93mm Zeta  stent) & diagonal stenting (2.5x33mm pixel stent) w/moderate LAD disease (Dr. Bishop Limbo)    CORONARY ANGIOPLASTY WITH STENT PLACEMENT  03/17/2012   Xience DES (2.25x64mm) of mid-distal LAD (Dr. Bishop Limbo)   LEFT HEART CATHETERIZATION WITH CORONARY ANGIOGRAM N/A 03/17/2012   Procedure: LEFT HEART CATHETERIZATION WITH CORONARY ANGIOGRAM;  Surgeon: Chrystie Nose, MD;  Location: [facility] CATH LAB;  Service: Cardiovascular;  Laterality: N/A;   PERCUTANEOUS CORONARY STENT  INTERVENTION (PCI-S)  03/17/2012   Procedure: PERCUTANEOUS CORONARY STENT INTERVENTION (PCI-S);  Surgeon: Chrystie Nose, MD;  Location: [facility] CATH LAB;  Service: Cardiovascular;;   POLYPECTOMY  02/25/2017   Procedure: POLYPECTOMY;  Surgeon: West Bali, MD;  [facility];  Service: Endoscopy;;  cecal x2; ascending x4;hepatic flexure;splenic flexure; descending x2;   POLYPECTOMY  04/15/2020   Procedure: POLYPECTOMY;  Surgeon: Lanelle Bal, DO;  Location[facility];  Service: Endoscopy;;  cecal    TRANSTHORACIC ECHOCARDIOGRAM  03/29/2006   EF normal; mild MR & TR; AV mildly sclerotic     Home Medications:  Prior to Admission medications   Medication Sig Start Date End Date Taking? Authorizing Provider  acetaminophen (TYLENOL) 500 MG tablet Take 500 mg by mouth every 6 (six) hours as needed for moderate pain.   Yes [provider]  albuterol (VENTOLIN HFA) 108 (90 Base) MCG/ACT inhaler Inhale 2 puffs into the lungs every 4 (four) hours as needed for wheezing or shortness of breath. 09/16/19  Yes [provider]  amLODipine (NORVASC) 5 MG tablet Take 5 mg by mouth daily. 09/27/21  Yes [provider]  aspirin EC 81 MG tablet Take 81 mg by mouth daily.   Yes [provider]  gabapentin (NEURONTIN) 300 MG capsule Take 300 mg by mouth 3 (three) times daily as needed (pain). 10/13/20  Yes [provider]  loratadine (CLARITIN) 10 MG tablet Take 10 mg by mouth daily.   Yes [provider]  metoprolol tartrate (LOPRESSOR) 25 MG tablet Take 25 mg by mouth daily. 09/27/21  Yes [provider]  nitroGLYCERIN (NITROSTAT) 0.4 MG SL tablet Place 1 tablet (0.4 mg total) under the tongue every 5 (five) minutes as needed for chest pain. 10/08/16  Yes Hilty, Lisette Abu, MD  rosuvastatin (CRESTOR) 10 MG tablet Take 1 tablet by mouth once daily Patient taking differently: Take 10 mg by mouth daily. 05/20/18  Yes Chrystie Nose, MD     Inpatient Medications: Scheduled Meds:  Continuous Infusions:  sodium chloride     heparin 850 Units/hr (06/26/22 0728)   PRN Meds: nitroGLYCERIN  Allergies:   No Known Allergies  Social History:   Social History   Socioeconomic History   Marital status: Married    Spouse name: Not on file   Number of children: 1   Years of education: Not on file   Highest education level: Not on file  Occupational History   Not on file  Tobacco Use   Smoking status: Every Day    Packs/day: 0.10    Years: 40.00    Additional pack years: 0.00    Total pack years: 4.00    Types: Cigarettes   Smokeless tobacco: Never   Tobacco comments:    smokes 3-4 cigarettes daily ("it all depends") 10/27/13  Vaping Use   Vaping Use: Never used  Substance and Sexual Activity   Alcohol use: No   Drug use: No   Sexual activity: Not on file  Other Topics Concern  Not on file  Social History Narrative   Not on file   Social Determinants of Health   Financial Resource Strain: Not on file  Food Insecurity: Not on file  Transportation Needs: Not on file  Physical Activity: Not on file  Stress: Not on file  Social Connections: Not on file  Intimate Partner Violence: Not on file    Family History:    Family History  Problem Relation Age of Onset   Heart disease Brother        x3   Stroke Father      ROS:  Please see the history of present illness.   All other ROS reviewed and negative.     Physical Exam/Data:   Vitals:   06/26/22 0330 06/26/22 0400 06/26/22 0500 06/26/22 0717  BP: 134/75 135/80 131/84 129/78  Pulse: 67 68 77 74  Resp: (!) 22 (!) 23 20 18   Temp:   97.6 F (36.4 C) 98.2 F (36.8 C)  TempSrc:   Oral Oral  SpO2: 97% 97% 97% 98%  Weight:   64.5 kg   Height:   6' (1.829 m)     Intake/Output Summary (Last 24 hours) at 06/26/2022 0753 Last data filed at 06/26/2022 0728 Gross per 24 hour  Intake 122.08 ml  Output --  Net 122.08 ml      06/26/2022    5:00  AM 06/25/2022    6:52 PM 12/25/2021    9:24 AM  Last 3 Weights  Weight (lbs) 142 lb 4.8 oz 157 lb 6.5 oz 157 lb 6.4 oz  Weight (kg) 64.547 kg 71.4 kg 71.396 kg     Body mass index is 19.3 kg/m.  General:  Well nourished, well developed, in no acute distress HEENT: normal Neck: no JVD Vascular: No carotid bruits; Distal pulses 2+ bilaterally Cardiac:  normal S1, S2; RRR; no murmur  Lungs:  clear to auscultation bilaterally, no wheezing, rhonchi or rales  Abd: soft, nontender, no hepatomegaly  Ext: no edema Musculoskeletal:  No deformities, BUE and BLE strength normal and equal Skin: warm and dry  Neuro:  CNs 2-12 intact, no focal abnormalities noted Psych:  Normal affect   EKG:  The EKG was personally reviewed and demonstrates:  sinus rhythm with HR 86, TWI anterolateral leads Telemetry:  Telemetry was personally reviewed and demonstrates:  sinus rhythm in the 70-80s  Relevant CV Studies:  Echo pending   Laboratory Data:  High Sensitivity Troponin:   Recent Labs  Lab 06/25/22 1916 06/25/22 2042 06/26/22 0117 06/26/22 0309  TROPONINIHS 28* 2,859* 16,211* 16,403*     Chemistry Recent Labs  Lab 06/25/22 1916  NA 134*  K 3.6  CL 102  CO2 21*  GLUCOSE 128*  BUN 15  CREATININE 1.56*  CALCIUM 8.9  GFRNONAA 47*  ANIONGAP 11    No results for input(s): "PROT", "ALBUMIN", "AST", "ALT", "ALKPHOS", "BILITOT" in the last 168 hours. Lipids No results for input(s): "CHOL", "TRIG", "HDL", "LABVLDL", "LDLCALC", "CHOLHDL" in the last 168 hours.  Hematology Recent Labs  Lab 06/25/22 1916  WBC 11.7*  RBC 4.31  HGB 13.2  HCT 37.5*  MCV 87.0  MCH 30.6  MCHC 35.2  RDW 13.1  PLT 204   Thyroid No results for input(s): "TSH", "FREET4" in the last 168 hours.  BNPNo results for input(s): "BNP", "PROBNP" in the last 168 hours.  DDimer No results for input(s): "DDIMER" in the last 168 hours.   Radiology/Studies:  [facility] Chest Port 1 View  Result  Date: 06/25/2022 CLINICAL  DATA:  Chest pain. EXAM: PORTABLE CHEST 1 VIEW COMPARISON:  03/14/2012. FINDINGS: Clear lungs. Normal heart size and mediastinal contours. No pleural effusion or pneumothorax. Visualized bones and upper abdomen are unremarkable. IMPRESSION: No evidence of acute cardiopulmonary disease. Electronically Signed   By: Orvan Falconer M.D.   On: 06/25/2022 19:33     Assessment and Plan:   NSTEMI - HS troponin trended 28 --> 2859 --> 16211 --> 16403 - EKG with TWI V3-6 - pt presented with chest pain concerning for unstable angina after having new exertional fatigue earlier in the day yesterday - discussed definitive angiography with LHC  The patient understands that risks included but are not limited to stroke (1 in 1000), death (1 in 1000), kidney failure [usually temporary] (1 in 500), bleeding (1 in 200), allergic reaction [possibly serious] (1 in 200).     CAD Prior stenting of LCX, diagonal, LAD Remains on ASA, BB, statin   AKI sCr 1.56 - baseline difficult - ordered gentle fluids  - recheck BMP   Hypertension PTA: BB, amlodipine Hold amlodipine for now   Hyperlipidemia with LDL goal < 70 Update lipid panel Increase crestor to 40 mg   Shortness of breath Currently respirations unlabored BNP pending CXR looks nonacute   Current smoker Cessation recommended. He declines nicotine patch at this time   Risk Assessment/Risk Scores:     TIMI Risk Score for Unstable Angina or Non-ST Elevation MI:   The patient's TIMI risk score is 6, which indicates a 41% risk of all cause mortality, new or recurrent myocardial infarction or need for urgent revascularization in the next 14 days.       For questions or updates, please contact [facility] Please consult [email or web address] for contact info under    Signed, Marcelino Duster, [place]  06/26/2022 7:53 AM

## 2022-06-26 NOTE — Progress Notes (Signed)
ANTICOAGULATION CONSULT NOTE - Follow Up Consult  Pharmacy Consult for Heparin Indication: chest pain/ACS  No Known Allergies  Patient Measurements: Height: 6' (182.9 cm) Weight: 64.5 kg (142 lb 4.8 oz) IBW/kg (Calculated) : 77.6 Heparin Dosing Weight: 64.5 kg  Vital Signs: Temp: 98.2 F (36.8 C) (05/14 0717) Temp Source: Oral (05/14 0717) BP: 129/78 (05/14 0717) Pulse Rate: 74 (05/14 0717)  Labs: Recent Labs    06/25/22 1916 06/25/22 2042 06/26/22 0117 06/26/22 0309 06/26/22 0846  HGB 13.2  --   --   --   --   HCT 37.5*  --   --   --   --   PLT 204  --   --   --   --   HEPARINUNFRC  --   --   --   --  0.22*  CREATININE 1.56*  --   --   --   --   TROPONINIHS 28* 2,859* 16,211* 16,403*  --     Estimated Creatinine Clearance: 40.2 mL/min (A) (by C-G formula based on SCr of 1.56 mg/dL (H)).  Assessment: Pharmacy consulted to dose heparin in patient with chest pain/ACS/STEMI. Patient is not on anticoagulation prior to admission.   Initial heparin level is subtherapeutic (0.22) on 850 units/hr. For cardiac cath today.  Goal of Therapy:  Heparin level 0.3-0.7 units/ml Monitor platelets by anticoagulation protocol: Yes   Plan:  Increase heparin drip to 1000 units/hr. Follow up post-cath.  Dennie Fetters, RPh 06/26/2022,10:55 AM

## 2022-06-26 NOTE — Interval H&P Note (Signed)
History and Physical Interval Note:  06/26/2022 4:29 PM  Robert Shaw  has presented today for surgery, with the diagnosis of nstemi.  The various methods of treatment have been discussed with the patient and family. After consideration of risks, benefits and other options for treatment, the patient has consented to  Procedure(s): LEFT HEART CATH AND CORONARY ANGIOGRAPHY (N/A) as a surgical intervention.  The patient's history has been reviewed, patient examined, no change in status, stable for surgery.  I have reviewed the patient's chart and labs.  Questions were answered to the patient's satisfaction.    Cath Lab Visit (complete for each Cath Lab visit)  Clinical Evaluation Leading to the Procedure:   ACS: Yes.    Non-ACS:    Anginal Classification: CCS IV  Anti-ischemic medical therapy: Maximal Therapy (2 or more classes of medications)  Non-Invasive Test Results: No non-invasive testing performed  Prior CABG: No previous CABG       Robert Shaw [facility] 06/26/2022 4:29 PM

## 2022-06-26 NOTE — Progress Notes (Signed)
  Transition of Care [facility]) Screening Note   Patient Details  Name: BRANDTLY DOIRON Date of Birth: 15-Mar-1950   Transition of Care [facility]) CM/SW Contact:    Darrold Span, RN Phone Number: 06/26/2022, 3:15 PM    Transition of Care Department [facility]) has reviewed patient and note plan for cardiac cath. No TOC needs have been identified at this time. We will continue to monitor patient advancement through interdisciplinary progression rounds. If new patient transition needs arise, please place a TOC consult.

## 2022-06-27 ENCOUNTER — Telehealth: Payer: Self-pay | Admitting: Physician Assistant

## 2022-06-27 ENCOUNTER — Other Ambulatory Visit (HOSPITAL_COMMUNITY): Payer: Self-pay

## 2022-06-27 ENCOUNTER — Telehealth (HOSPITAL_COMMUNITY): Payer: Self-pay | Admitting: Pharmacy Technician

## 2022-06-27 ENCOUNTER — Encounter (HOSPITAL_COMMUNITY): Payer: Self-pay | Admitting: Cardiology

## 2022-06-27 DIAGNOSIS — R9431 Abnormal electrocardiogram [ECG] [EKG]: Secondary | ICD-10-CM | POA: Insufficient documentation

## 2022-06-27 DIAGNOSIS — I10 Essential (primary) hypertension: Secondary | ICD-10-CM | POA: Insufficient documentation

## 2022-06-27 DIAGNOSIS — H0520 Unspecified exophthalmos: Secondary | ICD-10-CM | POA: Insufficient documentation

## 2022-06-27 DIAGNOSIS — N179 Acute kidney failure, unspecified: Secondary | ICD-10-CM | POA: Insufficient documentation

## 2022-06-27 LAB — CBC
HCT: 36.6 % — ABNORMAL LOW (ref 39.0–52.0)
Hemoglobin: 12.9 g/dL — ABNORMAL LOW (ref 13.0–17.0)
MCH: 30.1 pg (ref 26.0–34.0)
MCHC: 35.2 g/dL (ref 30.0–36.0)
MCV: 85.5 fL (ref 80.0–100.0)
Platelets: 215 10*3/uL (ref 150–400)
RBC: 4.28 MIL/uL (ref 4.22–5.81)
RDW: 13.2 % (ref 11.5–15.5)
WBC: 11.1 10*3/uL — ABNORMAL HIGH (ref 4.0–10.5)
nRBC: 0 % (ref 0.0–0.2)

## 2022-06-27 LAB — LIPID PANEL
Cholesterol: 88 mg/dL (ref 0–200)
HDL: 26 mg/dL — ABNORMAL LOW (ref >40)
LDL Cholesterol: 48 mg/dL (ref 0–99)
Total CHOL/HDL Ratio: 3.4 RATIO
Triglycerides: 71 mg/dL (ref <150)
VLDL: 14 mg/dL (ref 0–40)

## 2022-06-27 LAB — HEPATIC FUNCTION PANEL
ALT: 27 U/L (ref 0–44)
AST: 53 U/L — ABNORMAL HIGH (ref 15–41)
Albumin: 3.3 g/dL — ABNORMAL LOW (ref 3.5–5.0)
Alkaline Phosphatase: 80 U/L (ref 38–126)
Bilirubin, Direct: 0.4 mg/dL — ABNORMAL HIGH (ref 0.0–0.2)
Total Bilirubin: UNDETERMINED mg/dL (ref 0.3–1.2)
Total Protein: 7.5 g/dL (ref 6.5–8.1)

## 2022-06-27 LAB — BASIC METABOLIC PANEL
Anion gap: 14 (ref 5–15)
BUN: 15 mg/dL (ref 8–23)
CO2: 17 mmol/L — ABNORMAL LOW (ref 22–32)
Calcium: 8.9 mg/dL (ref 8.9–10.3)
Chloride: 105 mmol/L (ref 98–111)
Creatinine, Ser: 1.44 mg/dL — ABNORMAL HIGH (ref 0.61–1.24)
GFR, Estimated: 52 mL/min — ABNORMAL LOW (ref >60)
Glucose, Bld: 106 mg/dL — ABNORMAL HIGH (ref 70–99)
Potassium: 3.6 mmol/L (ref 3.5–5.1)
Sodium: 136 mmol/L (ref 135–145)

## 2022-06-27 LAB — MAGNESIUM: Magnesium: 2 mg/dL (ref 1.7–2.4)

## 2022-06-27 LAB — TSH: TSH: 0.931 u[IU]/mL (ref 0.350–4.500)

## 2022-06-27 LAB — POCT ACTIVATED CLOTTING TIME: Activated Clotting Time: 385 seconds

## 2022-06-27 MED ORDER — POTASSIUM CHLORIDE CRYS ER 20 MEQ PO TBCR
40.0000 meq | EXTENDED_RELEASE_TABLET | Freq: Once | ORAL | Status: AC
  Administered 2022-06-27: 40 meq via ORAL
  Filled 2022-06-27: qty 2

## 2022-06-27 MED ORDER — ROSUVASTATIN CALCIUM 40 MG PO TABS
40.0000 mg | ORAL_TABLET | Freq: Every day | ORAL | 5 refills | Status: DC
  Filled 2022-06-27: qty 30, 30d supply, fill #0

## 2022-06-27 MED ORDER — METOPROLOL TARTRATE 25 MG PO TABS
12.5000 mg | ORAL_TABLET | Freq: Two times a day (BID) | ORAL | 5 refills | Status: DC
  Filled 2022-06-27: qty 30, 30d supply, fill #0

## 2022-06-27 MED ORDER — AMLODIPINE BESYLATE 5 MG PO TABS
5.0000 mg | ORAL_TABLET | Freq: Every day | ORAL | Status: DC
  Administered 2022-06-27: 5 mg via ORAL
  Filled 2022-06-27: qty 1

## 2022-06-27 MED ORDER — CLOPIDOGREL BISULFATE 75 MG PO TABS
75.0000 mg | ORAL_TABLET | Freq: Every day | ORAL | 11 refills | Status: DC
  Filled 2022-06-27: qty 30, 30d supply, fill #0

## 2022-06-27 MED ORDER — NITROGLYCERIN 0.4 MG SL SUBL
0.4000 mg | SUBLINGUAL_TABLET | SUBLINGUAL | 3 refills | Status: AC | PRN
Start: 2022-06-27 — End: ?
  Filled 2022-06-27: qty 25, 5d supply, fill #0

## 2022-06-27 MED ORDER — IOHEXOL 350 MG/ML SOLN
INTRAVENOUS | Status: DC | PRN
  Administered 2022-06-26: 50 mL

## 2022-06-27 MED FILL — Nitroglycerin IV Soln 100 MCG/ML in D5W: INTRA_ARTERIAL | Qty: 10 | Status: AC

## 2022-06-27 NOTE — Progress Notes (Signed)
Patient removed dressing to R/radial, NT called primary RN in to assess bleeding.   On assessment patient was "oozing " from R/Radial site.  Cardiology informed.

## 2022-06-27 NOTE — Progress Notes (Signed)
Progress Note  Patient Name: Robert Shaw Date of Encounter: 06/27/2022  Primary Cardiologist: Chrystie Nose, MD  Subjective   Feeling well this AM, no CP or SOB. Walked with cardiac rehab. Per their note "Pt stood but then immediately sat back down, pt refused to st why. After 1 min of rest pt ambulated hall without major difficulty. HR up but no CP. Pt seemed slightly weaker/more fatigued than his norm but he would not admit to this. Return to EOB. Overall tolerated well."  Inpatient Medications    Scheduled Meds:  aspirin EC  81 mg Oral Daily   clopidogrel  75 mg Oral Q breakfast   enoxaparin (LOVENOX) injection  40 mg Subcutaneous Q24H   loratadine  10 mg Oral Daily   metoprolol tartrate  12.5 mg Oral BID   rosuvastatin  40 mg Oral Daily   sodium chloride flush  3 mL Intravenous Q12H   sodium chloride flush  3 mL Intravenous Q12H   Continuous Infusions:  sodium chloride     PRN Meds: sodium chloride, acetaminophen, albuterol, gabapentin, nitroGLYCERIN, ondansetron (ZOFRAN) IV, sodium chloride flush   Vital Signs    Vitals:   06/26/22 1900 06/26/22 1922 06/27/22 0252 06/27/22 0754  BP: (!) 168/81 (!) 161/70 130/76 125/75  Pulse: 91 68 83 77  Resp: (!) 25 (!) 22 20 12   Temp:  97.9 F (36.6 C) 98 F (36.7 C) 98.2 F (36.8 C)  TempSrc:  Bladder Oral Oral  SpO2: 96% 99% 95%   Weight:   65 kg   Height:   6' (1.829 m)     Intake/Output Summary (Last 24 hours) at 06/27/2022 0917 Last data filed at 06/27/2022 0300 Gross per 24 hour  Intake 414.95 ml  Output 425 ml  Net -10.05 ml      06/27/2022    2:52 AM 06/26/2022    5:00 AM 06/25/2022    6:52 PM  Last 3 Weights  Weight (lbs) 143 lb 4.8 oz 142 lb 4.8 oz 157 lb 6.5 oz  Weight (kg) 65 kg 64.547 kg 71.4 kg     Telemetry    NSR, rare PACs with ambulation - Personally Reviewed  ECG    Not crossing over into Epic but shows NSR 76bpm, marked TWI II, III, avF, V2-V6, QTc - Personally  Reviewed    Physical Exam   GEN: No acute distress.  HEENT: Normocephalic, atraumatic, sclera non-icteric. Mild exopthlamos Neck: No JVD or bruits. Cardiac: RRR no murmurs, rubs, or gallops.  Respiratory: Clear to auscultation bilaterally. Breathing is unlabored. GI: Soft, nontender, non-distended, BS +x 4. MS: no deformity. Extremities: No clubbing or cyanosis. No edema. Distal pedal pulses are 2+ and equal bilaterally. Right radial cath site without hematoma or ecchymosis; good pulse. Neuro:  AAOx3. Follows commands. Psych:  Responds to questions appropriately with a normal affect.  Labs    High Sensitivity Troponin:   Recent Labs  Lab 06/25/22 1916 06/25/22 2042 06/26/22 0117 06/26/22 0309  TROPONINIHS 28* 2,859* 16,211* 16,403*      Cardiac EnzymesNo results for input(s): "TROPONINI" in the last 168 hours. No results for input(s): "TROPIPOC" in the last 168 hours.   Chemistry Recent Labs  Lab 06/25/22 1916 06/26/22 1247 06/27/22 0250  NA 134* 135 136  K 3.6 3.8 3.6  CL 102 104 105  CO2 21* 19* 17*  GLUCOSE 128* 97 106*  BUN 15 14 15   CREATININE 1.56* 1.31* 1.44*  CALCIUM 8.9 9.1 8.9  GFRNONAA  47* 59* 52*  ANIONGAP 11 12 14      Hematology Recent Labs  Lab 06/25/22 1916 06/26/22 1247 06/27/22 0250  WBC 11.7* 10.7* 11.1*  RBC 4.31 4.29 4.28  HGB 13.2 12.9* 12.9*  HCT 37.5* 37.6* 36.6*  MCV 87.0 87.6 85.5  MCH 30.6 30.1 30.1  MCHC 35.2 34.3 35.2  RDW 13.1 13.4 13.2  PLT 204 220 215    BNP Recent Labs  Lab 06/26/22 0906  BNP 91.0     DDimer No results for input(s): "DDIMER" in the last 168 hours.   Radiology    CARDIAC CATHETERIZATION  Result Date: 06/26/2022   Dist LAD lesion is 90% stenosed.   Lat 1st Diag lesion is 90% stenosed.   Prox RCA to Mid RCA lesion is 60% stenosed.   Previously placed Mid LAD to Dist LAD stent of unknown type is  widely patent.   Previously placed 1st Diag stent of unknown type is  widely patent.   Previously  placed Prox Cx to Mid Cx stent of unknown type is  widely patent.   A drug-eluting stent was successfully placed using a SYNERGY XD 2.25X12.   Post intervention, there is a 0% residual stenosis.   Post intervention, there is a 0% residual stenosis.   The left ventricular systolic function is normal.   LV end diastolic pressure is mildly elevated.   The left ventricular ejection fraction is 55-65% by visual estimate. Continued patency of stents in the mid LAD, first diagonal, LCx. New culprit lesion in the distal LAD at distal stent margin. Sub branch of the fist diagonal is too small for PCI. The larger diagonal branch is patent. Overall good LV function with inferoapical wall motion abnormality from wrap around LAD Mildly elevated LVEDP 17 mm Hg Successful PCI of the distal LAD with DES x 1 overlapping with old stent Plan: DAPT for one year. Anticipate DC in am.   ECHOCARDIOGRAM COMPLETE  Result Date: 06/26/2022    ECHOCARDIOGRAM REPORT   Patient Name:   Robert Shaw Date of Exam: 06/26/2022 Medical Rec #:  [id number]      Height:       72.0 in Accession #:    [id number]     Weight:       142.3 lb Date of Birth:  06/14/1950      BSA:          1.844 m Patient Age:    71 years       BP:           129/78 mmHg Patient Gender: M              HR:           69 bpm. Exam Location:  Inpatient Procedure: 2D Echo, Cardiac Doppler and Color Doppler Indications:    Chest pain  History:        Patient has prior history of Echocardiogram examinations, most                 recent 03/29/2006. Angina, Arrythmias:Bradycardia; Risk                 Factors:HLD.  Sonographer:    Lucy Antigua Referring Phys: [id number] ANGELA NICOLE DUKE IMPRESSIONS  1. Left ventricular ejection fraction, by estimation, is 55 to 60%. The left ventricle has normal function. The left ventricle has no regional wall motion abnormalities. Left ventricular diastolic parameters are indeterminate.  2. Right ventricular systolic function is mildly reduced. The  right ventricular size is normal. Mildly increased right ventricular wall thickness. Tricuspid regurgitation signal is inadequate for assessing PA pressure.  3. The mitral valve is normal in structure. No evidence of mitral valve regurgitation. No evidence of mitral stenosis.  4. The aortic valve is normal in structure. Aortic valve regurgitation is not visualized. No aortic stenosis is present.  5. The inferior vena cava is normal in size with greater than 50% respiratory variability, suggesting right atrial pressure of 3 mmHg. FINDINGS  Left Ventricle: Left ventricular ejection fraction, by estimation, is 55 to 60%. The left ventricle has normal function. The left ventricle has no regional wall motion abnormalities. The left ventricular internal cavity size was normal in size. There is  no left ventricular hypertrophy. Left ventricular diastolic parameters are indeterminate. Right Ventricle: The right ventricular size is normal. Mildly increased right ventricular wall thickness. Right ventricular systolic function is mildly reduced. Tricuspid regurgitation signal is inadequate for assessing PA pressure. Left Atrium: Left atrial size was normal in size. Right Atrium: Right atrial size was normal in size. Pericardium: There is no evidence of pericardial effusion. Mitral Valve: The mitral valve is normal in structure. No evidence of mitral valve regurgitation. No evidence of mitral valve stenosis. Tricuspid Valve: The tricuspid valve is normal in structure. Tricuspid valve regurgitation is not demonstrated. No evidence of tricuspid stenosis. Aortic Valve: The aortic valve is normal in structure. Aortic valve regurgitation is not visualized. No aortic stenosis is present. Aortic valve mean gradient measures 3.0 mmHg. Aortic valve peak gradient measures 5.6 mmHg. Pulmonic Valve: The pulmonic valve was normal in structure. Pulmonic valve regurgitation is not visualized. No evidence of pulmonic stenosis. Aorta: The  aortic root is normal in size and structure. Venous: The inferior vena cava is normal in size with greater than 50% respiratory variability, suggesting right atrial pressure of 3 mmHg. IAS/Shunts: No atrial level shunt detected by color flow Doppler.  LEFT VENTRICLE PLAX 2D LVIDd:         4.20 cm Diastology LVIDs:         2.60 cm LV e' medial:    5.77 cm/s LV PW:         0.90 cm LV E/e' medial:  8.4 LV IVS:        0.90 cm LV e' lateral:   7.51 cm/s                        LV E/e' lateral: 6.5  RIGHT VENTRICLE RV S prime:     14.70 cm/s TAPSE (M-mode): 2.6 cm LEFT ATRIUM           Index        RIGHT ATRIUM           Index LA Vol (A2C): 7.3 ml  3.98 ml/m   RA Area:     12.10 cm LA Vol (A4C): 26.7 ml 14.48 ml/m  RA Volume:   28.60 ml  15.51 ml/m  AORTIC VALVE AV Vmax:           118.00 cm/s AV Vmean:          75.300 cm/s AV VTI:            0.267 m AV Peak Grad:      5.6 mmHg AV Mean Grad:      3.0 mmHg LVOT Vmax:         113.00 cm/s LVOT Vmean:        65.500 cm/s LVOT  VTI:          0.227 m LVOT/AV VTI ratio: 0.85  AORTA Ao Root diam: 3.40 cm Ao Asc diam:  3.40 cm MITRAL VALVE MV Area (PHT): 2.32 cm    SHUNTS MV E velocity: 48.70 cm/s  Systemic VTI: 0.23 m MV A velocity: 65.10 cm/s MV E/A ratio:  0.75 Kardie Tobb DO Electronically signed by Thomasene Ripple DO Signature Date/Time: 06/26/2022/12:41:44 PM    Final    DG Chest Port 1 View  Result Date: 06/25/2022 CLINICAL DATA:  Chest pain. EXAM: PORTABLE CHEST 1 VIEW COMPARISON:  03/14/2012. FINDINGS: Clear lungs. Normal heart size and mediastinal contours. No pleural effusion or pneumothorax. Visualized bones and upper abdomen are unremarkable. IMPRESSION: No evidence of acute cardiopulmonary disease. Electronically Signed   By: Orvan Falconer M.D.   On: 06/25/2022 19:33    Cardiac Studies   Cath 06/26/22   Dist LAD lesion is 90% stenosed.   Lat 1st Diag lesion is 90% stenosed.   Prox RCA to Mid RCA lesion is 60% stenosed.   Previously placed Mid LAD to Dist LAD  stent of unknown type is  widely patent.   Previously placed 1st Diag stent of unknown type is  widely patent.   Previously placed Prox Cx to Mid Cx stent of unknown type is  widely patent.   A drug-eluting stent was successfully placed using a SYNERGY XD 2.25X12.   Post intervention, there is a 0% residual stenosis.   Post intervention, there is a 0% residual stenosis.   The left ventricular systolic function is normal.   LV end diastolic pressure is mildly elevated.   The left ventricular ejection fraction is 55-65% by visual estimate.   Continued patency of stents in the mid LAD, first diagonal, LCx. New culprit lesion in the distal LAD at distal stent margin. Sub branch of the fist diagonal is too small for PCI. The larger diagonal branch is patent. Overall good LV function with inferoapical wall motion abnormality from wrap around LAD Mildly elevated LVEDP 17 mm Hg Successful PCI of the distal LAD with DES x 1 overlapping with old stent   Plan: DAPT for one year. Anticipate DC in am.   Echo 06/26/22   1. Left ventricular ejection fraction, by estimation, is 55 to 60%. The  left ventricle has normal function. The left ventricle has no regional  wall motion abnormalities. Left ventricular diastolic parameters are  indeterminate.   2. Right ventricular systolic function is mildly reduced. The right  ventricular size is normal. Mildly increased right ventricular wall  thickness. Tricuspid regurgitation signal is inadequate for assessing PA  pressure.   3. The mitral valve is normal in structure. No evidence of mitral valve  regurgitation. No evidence of mitral stenosis.   4. The aortic valve is normal in structure. Aortic valve regurgitation is  not visualized. No aortic stenosis is present.   5. The inferior vena cava is normal in size with greater than 50%  respiratory variability, suggesting right atrial pressure of 3 mmHg.     Patient Profile     72 y.o. male with CAD s/p  prior stents in his LCX and diagonal 2003, DES to mLAD 2014, sinus bradycardia, tobacco abuse, HTN, HLD, suspected CKD stage 3a by labs (Cr 1.3-1.4 by remote labs)  presented upon [facility] to [facility] with NSTEMI and mildly elevated Cr from prior at 1.56.  Assessment & Plan    1. NSTEMI/CAD, HLD - lipid panel with  trig 71, HDL 26, LDL 48 - s/p cath 1/61/09 with findings above - Continued patency of stents in the mid LAD, first diagonal, LCx. New culprit lesion in the distal LAD at distal stent margin. Sub branch of the fist diagonal is too small for PCI. The larger diagonal branch is patent. He underwent successful PCI of the distal LAD with DES x 1 overlapping with old stent. Had overall good LV function with inferoapical wall motion abnormality from wrap around LAD, mildly elevated LVEDP not requiring diuresis - 2D echo this admission EF 55-60% no RWMA, mildly reduced RVSF with normal RV size - continue ASA, Plavix (new), metoprolol 12.5mg  BID (was on Lopressor 25mg  once daily PTA), rosuvastatin 40mg  daily (increased from 10mg  daily, CrCl >56ml/min and OK for dose increase) - will discuss plan for home amlodipine with MD  2. Prolonged QT interval - suspect driven by ischemia, will need to  today's EKG not crossing over into Epic, nurse looking into this - copied image above - K 3.6, will supplement x1 - add Mg level - avoid QT prolonging agents  3. Essential HTN - BP controlled this AM, meds as above  4. AKI on CKD stage 3a - Cr 1.56 on admission -> 1.31 -> 1.44 which is near prior baseline (the AKI may have been at baseline as well, we just don't have interim values since 2018)  5. Tobacco abuse - counseled on cessation  Mild exopthalmos noted on exam. May be patient's norm but sent TSH. Wants to use TOC pharmacy including for [facility] NTG refill. Does not need work note. Anticipate possible DC today, pending MD review of QT prolongation.  For questions or updates, please  contact [facility] Please consult [email or web address] for contact info under Cardiology/STEMI.  Signed, Laurann Montana, PA-C 06/27/2022, 9:17 AM

## 2022-06-27 NOTE — Progress Notes (Signed)
Medications delivered by pharmacy to bedside.

## 2022-06-27 NOTE — Telephone Encounter (Signed)
Pharmacy Patient Advocate Encounter  Insurance verification completed.    The patient is insured through [employer] Medicare Part D   The patient is currently admitted and ran test claims for the following: Brilinta .  Copays and coinsurance results were relayed to Inpatient clinical team.  

## 2022-06-27 NOTE — Telephone Encounter (Signed)
Patient currently still admitted. Will call once discharged.   Thanks!

## 2022-06-27 NOTE — TOC Benefit Eligibility Note (Signed)
Patient Advocate Encounter  Insurance verification completed.    The patient is currently admitted and upon discharge could be taking Brilinta 90 mg.  The current 30 day co-pay is $47.00.   The patient is insured through [employer] Medicare Part D  This test claim was processed through [facility]- copay amounts may vary at other pharmacies due to pharmacy/plan contracts, or as the patient moves through the different stages of their insurance plan.  Nyeli Holtmeyer, CPHT Pharmacy Patient Advocate Specialist [facility] Patient Advocate Team Direct Number: [phone]  Fax: [phone]       

## 2022-06-27 NOTE — Progress Notes (Signed)
AVS instruction were reviewed with the patient and wife at bedside.   All questions answered, all personal belongings were returned.    Patient verbalizes understanding. He stated, "I already told y'all I'm not going to rehab".    Education was provided regarding the importance of adhering to discharge instructions.

## 2022-06-27 NOTE — Discharge Summary (Signed)
Discharge Summary    Patient ID: Robert Shaw MRN: [id number]; DOB: [birth year]  Admit date: 06/25/2022 Discharge date: 06/27/2022  PCP:  Benetta Spar, MD   [facility] Providers Cardiologist:  Chrystie Nose, MD        Discharge Diagnoses    Principal Problem:   NSTEMI (non-ST elevated myocardial infarction) Active Problems:   CAD (coronary artery disease)   Hyperlipidemia   Tobacco dependence   Unstable angina (HCC)   QT prolongation   Acute kidney injury superimposed on CKD [facility])   Essential hypertension   Exophthalmos    Diagnostic Studies/Procedures    Cath 06/26/22   Dist LAD lesion is 90% stenosed.   Lat 1st Diag lesion is 90% stenosed.   Prox RCA to Mid RCA lesion is 60% stenosed.   Previously placed Mid LAD to Dist LAD stent of unknown type is  widely patent.   Previously placed 1st Diag stent of unknown type is  widely patent.   Previously placed Prox Cx to Mid Cx stent of unknown type is  widely patent.   A drug-eluting stent was successfully placed using a SYNERGY XD 2.25X12.   Post intervention, there is a 0% residual stenosis.   Post intervention, there is a 0% residual stenosis.   The left ventricular systolic function is normal.   LV end diastolic pressure is mildly elevated.   The left ventricular ejection fraction is 55-65% by visual estimate.   Continued patency of stents in the mid LAD, first diagonal, LCx. New culprit lesion in the distal LAD at distal stent margin. Sub branch of the fist diagonal is too small for PCI. The larger diagonal branch is patent. Overall good LV function with inferoapical wall motion abnormality from wrap around LAD Mildly elevated LVEDP 17 mm Hg Successful PCI of the distal LAD with DES x 1 overlapping with old stent   Plan: DAPT for one year. Anticipate DC in am.    Echo 06/26/22  1. Left ventricular ejection fraction, by estimation, is 55 to 60%. The  left ventricle has normal function. The  left ventricle has no regional  wall motion abnormalities. Left ventricular diastolic parameters are  indeterminate.   2. Right ventricular systolic function is mildly reduced. The right  ventricular size is normal. Mildly increased right ventricular wall  thickness. Tricuspid regurgitation signal is inadequate for assessing PA  pressure.   3. The mitral valve is normal in structure. No evidence of mitral valve  regurgitation. No evidence of mitral stenosis.   4. The aortic valve is normal in structure. Aortic valve regurgitation is  not visualized. No aortic stenosis is present.   5. The inferior vena cava is normal in size with greater than 50%  respiratory variability, suggesting right atrial pressure of 3 mmHg.  _____________   History of Present Illness     72 y.o. male with CAD s/p prior stents in his LCX and diagonal 2003, DES to mLAD 2014, sinus bradycardia, tobacco abuse, HTN, HLD, suspected CKD stage 3a by labs (Cr 1.3-1.4 by remote labs) presented upon transfer from [facility] to [facility] with NSTEMI and mildly elevated Cr at 1.56. He had been reporting ongoing SOB for 1 year but the day prior to admission he developed worsening fatigue while taking the battery out of a tractor. That evening around 6pm he developed left sided chest pressure reminiscent of prior angina leading to heart catheterizations. He reports diaphoresis and shortness of breath, no radiation, no nausea. This  prompted evaluation at [facility] who trended troponin, found to be elevated. EKG showed NSR with marked TWI and QT prolongation. Given NSTEMI, he was transferred to [facility] for further management.   Hospital Course     1. NSTEMI/CAD, HLD - hsTroponin peaked at 16,403 - lipid panel with trig 71, HDL 26, LDL 48 - s/p cath 9/60/45 with findings above - Continued patency of stents in the mid LAD, first diagonal, LCx. New culprit lesion in the distal LAD at distal stent margin. Sub branch of the fist diagonal is too small for PCI.  The larger diagonal branch is patent. He underwent successful PCI of the distal LAD with DES x 1 overlapping with old stent. Had overall good LV function with inferoapical wall motion abnormality from wrap around LAD, mildly elevated LVEDP not requiring diuresis - 2D echo this admission EF 55-60% no RWMA, mildly reduced RVSF with normal RV size - continue ASA, Plavix (new), metoprolol 12.5mg  BID (was on Lopressor 25mg  once daily PTA), rosuvastatin 40mg  daily (increased from 10mg  daily, CrCl >72ml/min and OK for dose increase), resume home amlodipine 5mg  daily today - baseline LFTs sent day of discharge -  If the patient is tolerating statin at time of follow-up appointment, would consider rechecking liver function/lipid panel in 6-8 weeks - refill of SL NTG also sent in per request (pt wishes to use [facility] pharmacy)   2. Prolonged QT interval - suspect driven by ischemia - today's EKG not crossing over into Epic, see rounding note/Media for capture (QTc ) - Mg level wnl at 2.0 - K 3.6, will supplement x1, hold off regular supplementation given CKD but recommend to f/u lytes and EKG in follow-up visit - avoid QT prolonging agents   3. Essential HTN - managed in context above   4. AKI on CKD stage 3a - Cr 1.56 on admission -> 1.31 -> 1.44 which is near prior baseline (the AKI may have been at baseline as well, we just don't have interim values since 2018) - consider recheck Cr at f/u   5. Tobacco abuse - counseled on cessation, patient disinterested in quitting  6. Mild exophalmos on exam - TSH wnl, suspect normal for patient  7. Mild leukocytosis - suspect reactive to acute MI, no infective symptoms - consider trending in follow-up to ensure normalized  8. Pre-DM - A1c 5.9, recommended to f/u discuss with PCP in follow-up  Dr. Swaziland has seen and examined the patient today and feels he is stable for discharge. He does not need work note. Standard post MI restrictions outlined  on AVS. F/u has been arranged.     Did the patient have an acute coronary syndrome (MI, NSTEMI, STEMI, etc) this admission?:  Yes                               AHA/ACC Clinical Performance & Quality Measures: Aspirin prescribed? - Yes ADP Receptor Inhibitor (Plavix/Clopidogrel, Brilinta/Ticagrelor or Effient/Prasugrel) prescribed (includes medically managed patients)? - Yes Beta Blocker prescribed? - Yes High Intensity Statin (Lipitor 40-80mg  or Crestor 20-40mg ) prescribed? - Yes EF assessed during THIS hospitalization? - Yes For EF <40%, was ACEI/ARB prescribed? - Not Applicable (EF >/= 40%) For EF <40%, Aldosterone Antagonist (Spironolactone or Eplerenone) prescribed? - Not Applicable (EF >/= 40%) Cardiac Rehab Phase II ordered (including medically managed patients)? - Yes      The patient will be scheduled for a TOC follow up appointment on 5/24.  A message has been sent to the [facility].  _____________  Discharge Vitals Blood pressure (!) 114/59, pulse 75, temperature 98.3 F (36.8 C), temperature source Oral, resp. rate 18, height 6' (1.829 m), weight 65 kg, SpO2 100 %.  Filed Weights   06/25/22 1852 06/26/22 0500 06/27/22 0252  Weight: 71.4 kg 64.5 kg 65 kg    Labs & Radiologic Studies    CBC Recent Labs    06/26/22 1247 06/27/22 0250  WBC 10.7* 11.1*  HGB 12.9* 12.9*  HCT 37.6* 36.6*  MCV 87.6 85.5  PLT 220 215   Basic Metabolic Panel Recent Labs    16/10/96 1247 06/27/22 0250  NA 135 136  K 3.8 3.6  CL 104 105  CO2 19* 17*  GLUCOSE 97 106*  BUN 14 15  CREATININE 1.31* 1.44*  CALCIUM 9.1 8.9  MG  --  2.0   Liver Function Tests No results for input(s): "AST", "ALT", "ALKPHOS", "BILITOT", "PROT", "ALBUMIN" in the last 72 hours. No results for input(s): "LIPASE", "AMYLASE" in the last 72 hours. High Sensitivity Troponin:   Recent Labs  Lab 06/25/22 1916 06/25/22 2042 06/26/22 0117 06/26/22 0309  TROPONINIHS 28* 2,859* 16,211* 16,403*     BNP Invalid input(s): "POCBNP" D-Dimer No results for input(s): "DDIMER" in the last 72 hours. Hemoglobin A1C Recent Labs    06/26/22 1247  HGBA1C 5.9*   Fasting Lipid Panel Recent Labs    06/27/22 0250  CHOL 88  HDL 26*  LDLCALC 48  TRIG 71  CHOLHDL 3.4   Thyroid Function Tests Recent Labs    06/27/22 0250  TSH 0.931   _____________  CARDIAC CATHETERIZATION  Result Date: 06/26/2022   Dist LAD lesion is 90% stenosed.   Lat 1st Diag lesion is 90% stenosed.   Prox RCA to Mid RCA lesion is 60% stenosed.   Previously placed Mid LAD to Dist LAD stent of unknown type is  widely patent.   Previously placed 1st Diag stent of unknown type is  widely patent.   Previously placed Prox Cx to Mid Cx stent of unknown type is  widely patent.   A drug-eluting stent was successfully placed using a SYNERGY XD 2.25X12.   Post intervention, there is a 0% residual stenosis.   Post intervention, there is a 0% residual stenosis.   The left ventricular systolic function is normal.   LV end diastolic pressure is mildly elevated.   The left ventricular ejection fraction is 55-65% by visual estimate. Continued patency of stents in the mid LAD, first diagonal, LCx. New culprit lesion in the distal LAD at distal stent margin. Sub branch of the fist diagonal is too small for PCI. The larger diagonal branch is patent. Overall good LV function with inferoapical wall motion abnormality from wrap around LAD Mildly elevated LVEDP 17 mm Hg Successful PCI of the distal LAD with DES x 1 overlapping with old stent Plan: DAPT for one year. Anticipate DC in am.   ECHOCARDIOGRAM COMPLETE  Result Date: 06/26/2022    ECHOCARDIOGRAM REPORT   Patient Name:   Robert Shaw Date of Exam: 06/26/2022 Medical Rec #:  [id number]      Height:       72.0 in Accession #:    [id number]     Weight:       142.3 lb Date of Birth:  18-Mar-1950      BSA:          1.844 m Patient Age:  71 years       BP:           129/78 mmHg Patient Gender: M               HR:           69 bpm. Exam Location:  Inpatient Procedure: 2D Echo, Cardiac Doppler and Color Doppler Indications:    Chest pain  History:        Patient has prior history of Echocardiogram examinations, most                 recent 03/29/2006. Angina, Arrythmias:Bradycardia; Risk                 Factors:HLD.  Sonographer:    Lucy Antigua Referring Phys: [id number] ANGELA NICOLE DUKE IMPRESSIONS  1. Left ventricular ejection fraction, by estimation, is 55 to 60%. The left ventricle has normal function. The left ventricle has no regional wall motion abnormalities. Left ventricular diastolic parameters are indeterminate.  2. Right ventricular systolic function is mildly reduced. The right ventricular size is normal. Mildly increased right ventricular wall thickness. Tricuspid regurgitation signal is inadequate for assessing PA pressure.  3. The mitral valve is normal in structure. No evidence of mitral valve regurgitation. No evidence of mitral stenosis.  4. The aortic valve is normal in structure. Aortic valve regurgitation is not visualized. No aortic stenosis is present.  5. The inferior vena cava is normal in size with greater than 50% respiratory variability, suggesting right atrial pressure of 3 mmHg. FINDINGS  Left Ventricle: Left ventricular ejection fraction, by estimation, is 55 to 60%. The left ventricle has normal function. The left ventricle has no regional wall motion abnormalities. The left ventricular internal cavity size was normal in size. There is  no left ventricular hypertrophy. Left ventricular diastolic parameters are indeterminate. Right Ventricle: The right ventricular size is normal. Mildly increased right ventricular wall thickness. Right ventricular systolic function is mildly reduced. Tricuspid regurgitation signal is inadequate for assessing PA pressure. Left Atrium: Left atrial size was normal in size. Right Atrium: Right atrial size was normal in size. Pericardium: There is no  evidence of pericardial effusion. Mitral Valve: The mitral valve is normal in structure. No evidence of mitral valve regurgitation. No evidence of mitral valve stenosis. Tricuspid Valve: The tricuspid valve is normal in structure. Tricuspid valve regurgitation is not demonstrated. No evidence of tricuspid stenosis. Aortic Valve: The aortic valve is normal in structure. Aortic valve regurgitation is not visualized. No aortic stenosis is present. Aortic valve mean gradient measures 3.0 mmHg. Aortic valve peak gradient measures 5.6 mmHg. Pulmonic Valve: The pulmonic valve was normal in structure. Pulmonic valve regurgitation is not visualized. No evidence of pulmonic stenosis. Aorta: The aortic root is normal in size and structure. Venous: The inferior vena cava is normal in size with greater than 50% respiratory variability, suggesting right atrial pressure of 3 mmHg. IAS/Shunts: No atrial level shunt detected by color flow Doppler.  LEFT VENTRICLE PLAX 2D LVIDd:         4.20 cm Diastology LVIDs:         2.60 cm LV e' medial:    5.77 cm/s LV PW:         0.90 cm LV E/e' medial:  8.4 LV IVS:        0.90 cm LV e' lateral:   7.51 cm/s  LV E/e' lateral: 6.5  RIGHT VENTRICLE RV S prime:     14.70 cm/s TAPSE (M-mode): 2.6 cm LEFT ATRIUM           Index        RIGHT ATRIUM           Index LA Vol (A2C): 7.3 ml  3.98 ml/m   RA Area:     12.10 cm LA Vol (A4C): 26.7 ml 14.48 ml/m  RA Volume:   28.60 ml  15.51 ml/m  AORTIC VALVE AV Vmax:           118.00 cm/s AV Vmean:          75.300 cm/s AV VTI:            0.267 m AV Peak Grad:      5.6 mmHg AV Mean Grad:      3.0 mmHg LVOT Vmax:         113.00 cm/s LVOT Vmean:        65.500 cm/s LVOT VTI:          0.227 m LVOT/AV VTI ratio: 0.85  AORTA Ao Root diam: 3.40 cm Ao Asc diam:  3.40 cm MITRAL VALVE MV Area (PHT): 2.32 cm    SHUNTS MV E velocity: 48.70 cm/s  Systemic VTI: 0.23 m MV A velocity: 65.10 cm/s MV E/A ratio:  0.75 Kardie Tobb DO Electronically  signed by Thomasene Ripple DO Signature Date/Time: 06/26/2022/12:41:44 PM    Final    DG Chest Port 1 View  Result Date: 06/25/2022 CLINICAL DATA:  Chest pain. EXAM: PORTABLE CHEST 1 VIEW COMPARISON:  03/14/2012. FINDINGS: Clear lungs. Normal heart size and mediastinal contours. No pleural effusion or pneumothorax. Visualized bones and upper abdomen are unremarkable. IMPRESSION: No evidence of acute cardiopulmonary disease. Electronically Signed   By: Orvan Falconer M.D.   On: 06/25/2022 19:33   Disposition   Pt is being discharged home today in good condition.  Follow-up Plans & Appointments     Follow-up Information     Azalee Course, [place] Follow up.   Specialties: Cardiology, Radiology Why: [facility] location - cardiology follow-up arranged on Friday Jul 06, 2022 at 10:55 AM (Arrive by 10:40 AM). Wynema Birch is one of our PAs that works with Dr. Rennis Golden and our team. Contact information: [address] [place] [address] [phone]         Benetta Spar, MD Follow up.   Specialty: Internal Medicine Why: Your A1C level was 5.9 indicating pre-diabetes. Make sure to discuss at your next follow-up appointment with primary care. Contact information: [address] [place] [address] [phone]                Discharge Instructions     Amb Referral to Cardiac Rehabilitation   Complete by: As directed    Diagnosis:  Coronary Stents NSTEMI PTCA     After initial evaluation and assessments completed: Virtual Based Care may be provided alone or in conjunction with Phase 2 Cardiac Rehab based on patient barriers.: Yes   Intensive Cardiac Rehabilitation (ICR) [facility] location only OR Traditional Cardiac Rehabilitation (TCR) *If criteria for ICR are not met will enroll in TCR [facility] only): Yes   Diet - low sodium heart healthy   Complete by: As directed    Discharge instructions   Complete by: As directed    Please review your medication  list for new changes!   Increase activity slowly  Complete by: As directed    No driving for 1 week. No lifting over 10 lbs for 2 weeks. No sexual activity for 2 weeks. Keep procedure site clean & dry. If you notice increased pain, swelling, bleeding or pus, call/return!  You may shower, but no soaking baths/hot tubs/pools for 1 week.        Discharge Medications   Allergies as of 06/27/2022   No Known Allergies      Medication List     TAKE these medications    acetaminophen 500 MG tablet Commonly known as: TYLENOL Take 500 mg by mouth every 6 (six) hours as needed for moderate pain.   albuterol 108 (90 Base) MCG/ACT inhaler Commonly known as: VENTOLIN HFA Inhale 2 puffs into the lungs every 4 (four) hours as needed for wheezing or shortness of breath.   amLODipine 5 MG tablet Commonly known as: NORVASC Take 5 mg by mouth daily.   aspirin EC 81 MG tablet Take 81 mg by mouth daily.   clopidogrel 75 MG tablet Commonly known as: PLAVIX Take 1 tablet (75 mg total) by mouth daily.   gabapentin 300 MG capsule Commonly known as: NEURONTIN Take 300 mg by mouth 3 (three) times daily as needed (pain).   loratadine 10 MG tablet Commonly known as: CLARITIN Take 10 mg by mouth daily.   metoprolol tartrate 25 MG tablet Commonly known as: LOPRESSOR Take 0.5 tablets (12.5 mg total) by mouth 2 (two) times daily. What changed:  how much to take when to take this   nitroGLYCERIN 0.4 MG SL tablet Commonly known as: NITROSTAT Place 1 tablet (0.4 mg total) under the tongue every 5 (five) minutes as needed for chest pain (up to 3 doses). What changed: reasons to take this   rosuvastatin 40 MG tablet Commonly known as: CRESTOR Take 1 tablet (40 mg total) by mouth daily. What changed:  medication strength how much to take           Outstanding Labs/Studies   None ordered, see suggestions above.  Duration of Discharge Encounter   Greater than 30 minutes including  physician time.  Signed, Laurann Montana, PA-C 06/27/2022, 12:24 PM

## 2022-06-27 NOTE — Plan of Care (Signed)
  Problem: Education: Goal: Knowledge of General Education information will improve Description: Including pain rating scale, medication(s)/side effects and non-pharmacologic comfort measures Outcome: Adequate for Discharge   Problem: Health Behavior/Discharge Planning: Goal: Ability to manage health-related needs will improve Outcome: Adequate for Discharge   Problem: Clinical Measurements: Goal: Ability to maintain clinical measurements within normal limits will improve Outcome: Adequate for Discharge Goal: Will remain free from infection Outcome: Adequate for Discharge Goal: Diagnostic test results will improve Outcome: Adequate for Discharge Goal: Respiratory complications will improve Outcome: Adequate for Discharge Goal: Cardiovascular complication will be avoided Outcome: Adequate for Discharge   Problem: Activity: Goal: Risk for activity intolerance will decrease Outcome: Adequate for Discharge   Problem: Nutrition: Goal: Adequate nutrition will be maintained Outcome: Adequate for Discharge   Problem: Coping: Goal: Level of anxiety will decrease Outcome: Adequate for Discharge   Problem: Elimination: Goal: Will not experience complications related to bowel motility Outcome: Adequate for Discharge Goal: Will not experience complications related to urinary retention Outcome: Adequate for Discharge   Problem: Pain Managment: Goal: General experience of comfort will improve Outcome: Adequate for Discharge   Problem: Safety: Goal: Ability to remain free from injury will improve Outcome: Adequate for Discharge   Problem: Skin Integrity: Goal: Risk for impaired skin integrity will decrease Outcome: Adequate for Discharge   Problem: Education: Goal: Understanding of cardiac disease, CV risk reduction, and recovery process will improve Outcome: Adequate for Discharge Goal: Individualized Educational Video(s) Outcome: Adequate for Discharge   Problem:  Activity: Goal: Ability to tolerate increased activity will improve Outcome: Adequate for Discharge   Problem: Health Behavior/Discharge Planning: Goal: Ability to safely manage health-related needs after discharge will improve Outcome: Adequate for Discharge   Problem: Cardiac: Goal: Ability to achieve and maintain adequate cardiovascular perfusion will improve Outcome: Adequate for Discharge   Problem: Education: Goal: Understanding of CV disease, CV risk reduction, and recovery process will improve Outcome: Adequate for Discharge Goal: Individualized Educational Video(s) Outcome: Adequate for Discharge   Problem: Health Behavior/Discharge Planning: Goal: Ability to safely manage health-related needs after discharge will improve Outcome: Adequate for Discharge

## 2022-06-27 NOTE — Progress Notes (Signed)
CARDIAC REHAB PHASE I   PRE:  Rate/Rhythm: 80 SR    BP: sitting 138/77    SpO2: 98 RA  MODE:  Ambulation: 330 ft   POST:  Rate/Rhythm: 117 ST    BP: sitting 135/76     SpO2: 96 RA  Pt stood but then immediately sat back down, pt refused to st why. After 1 min of rest pt ambulated hall without major difficulty. HR up but no CP. Pt seemed slightly weaker/more fatigued than his norm but he would not admit to this. Return to EOB. Overall tolerated well.   Discussed MI, stent, restrictions, Plavix importance, smoking cessation, diet, exercise, NTG and CRPII with pt and spouse. Pt voiced reception however he is uninterested in quitting smoking. Will refer to [facility] CRPII however he is uninterested in program. He needs new NTG.  [id number]   Ethelda Chick BS, ACSM-CEP 06/27/2022 9:37 AM

## 2022-06-27 NOTE — Progress Notes (Signed)
Notified by nurse that after patient took tegaderm off there was slow oozing at radial cath site. Patient assessed with Dr. Swaziland. Patient assessed with very minimal oozing in the periphery of the cath site, possibly related to skin tear irritation around the area. The bleeding is minor and nonpulsatile. VSS. Patient still eager to be discharged. Per MD, would have him keep a dressing in place to keep gauze against the skin for the next 24 hours. Nurse will replace current gauze with fresh gauze so that he can monitor for worsening bleeding and keep arm wrapped with tape to support gentle pressure with the gauze. The patient was reminded to avoid using arm and lifting restrictions. Per Dr. Swaziland, OK to be discharged. Lab was not able to get his LFTs added on so would try to obtain for baseline when he's seen in follow-up. Added reminder to get EKG, CMET, CBC, Mg to appt notes for visit.

## 2022-06-27 NOTE — Telephone Encounter (Signed)
   Transition of Care Follow-up Phone Call Request    Patient Name: Robert Shaw Date of Birth: 09-28-1950 Date of Encounter: 06/27/2022  Primary Care Provider:  Benetta Spar, MD Primary Cardiologist:  Chrystie Nose, MD  [name] has been scheduled for a transition of care follow up appointment with a [facility] provider: Friday Jul 06, 2022 10:55 AM with Azalee Course PA-C  Please reach out to [name] within 48 hours to confirm appointment.  Laurann Montana, PA-C  06/27/2022, 10:52 AM

## 2022-06-28 LAB — LIPOPROTEIN A (LPA): Lipoprotein (a): 106.5 nmol/L — ABNORMAL HIGH (ref <75.0)

## 2022-06-28 NOTE — Telephone Encounter (Signed)
Patient contacted regarding discharge from [facility] on 06/27/2022.  Patient understands to follow up with provider [facility]  on 07/06/22 at 10:55 am  at [facility] [address] Patient understands discharge instructions? yes  Patient understands medications and regiment? yes  Patient understands to bring all medications to this visit? yes

## 2022-07-06 ENCOUNTER — Ambulatory Visit: Payer: Medicare Other | Attending: Physician Assistant | Admitting: Physician Assistant

## 2022-07-06 ENCOUNTER — Encounter: Payer: Self-pay | Admitting: Physician Assistant

## 2022-07-06 VITALS — BP 132/64 | HR 74 | Ht 72.0 in | Wt 137.8 lb

## 2022-07-06 DIAGNOSIS — I2510 Atherosclerotic heart disease of native coronary artery without angina pectoris: Secondary | ICD-10-CM

## 2022-07-06 DIAGNOSIS — Z79899 Other long term (current) drug therapy: Secondary | ICD-10-CM

## 2022-07-06 DIAGNOSIS — E785 Hyperlipidemia, unspecified: Secondary | ICD-10-CM

## 2022-07-06 DIAGNOSIS — I10 Essential (primary) hypertension: Secondary | ICD-10-CM | POA: Diagnosis not present

## 2022-07-06 DIAGNOSIS — N1831 Chronic kidney disease, stage 3a: Secondary | ICD-10-CM | POA: Diagnosis not present

## 2022-07-06 DIAGNOSIS — R630 Anorexia: Secondary | ICD-10-CM

## 2022-07-06 MED ORDER — ROSUVASTATIN CALCIUM 20 MG PO TABS: 20.0000 mg | ORAL_TABLET | Freq: Every day | ORAL | 3 refills | Status: DC

## 2022-07-06 NOTE — Patient Instructions (Signed)
Medication Instructions:  Your physician has recommended you make the following change in your medication:  DECREASE: Crestor 20 mg once daily *If you need a refill on your cardiac medications before your next appointment, please call your pharmacy*   Lab Work: Your physician recommends that you return for lab work: Today: BMET In 4 weeks: Lipids, LFTs If you have labs (blood work) drawn today and your tests are completely normal, you will receive your results only by: MyChart Message (if you have MyChart) OR A paper copy in the mail If you have any lab test that is abnormal or we need to change your treatment, we will call you to review the results.   Testing/Procedures: None   Follow-Up: At [facility], you and your health needs are our priority.  As part of our continuing mission to provide you with exceptional heart care, we have created designated Provider Care Teams.  These Care Teams include your primary Cardiologist (physician) and Advanced Practice Providers (APPs -  Physician Assistants and Nurse Practitioners) who all work together to provide you with the care you need, when you need it.     Your next appointment:   3-4 month(s)  Provider:   Chrystie Nose, MD

## 2022-07-06 NOTE — Progress Notes (Unsigned)
Cardiology Office Note:    Date:  07/07/2022   ID:  Robert Shaw, DOB 03/18/1950, MRN [id number]  PCP:  Benetta Spar, MD   [facility] Providers Cardiologist:  Chrystie Nose, MD     Referring MD: Benetta Spar*   Chief Complaint  Patient presents with   Follow-up    Seen for Dr. Rennis Golden    History of Present Illness:    Robert Shaw is a 72 y.o. male with a hx of CAD, bradycardia, tobacco abuse, hyperlipidemia and hypertension.  Patient had a stenting of the left circumflex artery and the diagonal in 2003 with moderate residual disease in LAD and RCA.  Left heart cath performed in February 2014 resulted in DES to mid LAD.  He presented to the hospital on 07/12/2022 with chest pain.  Serial enzyme went up to 16,000.  EKG showed T wave inversion in the anterolateral leads.  Initial creatinine was 1.56.  Echocardiogram obtained on 06/26/2022 showed EF 55 to 60%, no regional wall motion abnormality, mildly reduced RVEF, no significant valve issue.  Subsequent cardiac catheterization performed on 06/26/2022 demonstrated 90% distal LAD, 90% lateral D1, 60% proximal to mid RCA lesion.  Previously placed mid to distal LAD stent was widely patent.  Previously placed to proximal left circumflex stent was widely patent.  Distal LAD lesion was treated with a Synergy XD 2.25 x 12 mm DES.  The lateral D1 lesion was too small to fix.  Postprocedure, patient was placed on aspirin, Plavix on top of metoprolol and Crestor.  Since discharge, he has been doing well and has been compliant with his medication.  He denies any further chest discomfort.  He says he has completely lost all appetite after he left the hospital.  He is not sure which medication is causing the loss of appetite.  The only 2 new medication is the Plavix and a higher dose of crestor 40 mg daily.  I will reduced Crestor to 20 mg daily.  He will need a basic metabolic panel today to follow-up on renal function  and fasting lipid panel and LFT in 4 weeks.  He can follow-up with Dr. Rennis Golden in 3 to 3-month.   Past Medical History:  Diagnosis Date   Bradycardia 03/18/2012   Coronary artery disease    Dyslipidemia    Family history of heart disease    History of nuclear stress test 07/13/2008   dipyridamole; normal perfusion, no ischemia, low risk    Hypertension    NSTEMI (non-ST elevated myocardial infarction) (HCC)    h/o   Tobacco abuse     Past Surgical History:  Procedure Laterality Date   CARDIAC CATHETERIZATION  04/2001   after high lateral MI, preserved LV function w/mild mid anterolateral hypokinesia, diffuse coronary irregularity with signficiant obstruction in LAD, OM1, superior branch of ramus (Dr. Baxter Hire - [facility])   CARDIAC CATHETERIZATION  09/23/2001   patent stents from 06/2001 (Dr. Evlyn Courier)   CARDIAC CATHETERIZATION  09/17/2002   no restenosis at prior intervention sites (Dr. Bishop Limbo)   CARDIAC CATHETERIZATION  09/09/2009   no significant CAD, patent stents in OM1 & LAD (Dr. Bishop Limbo)    COLONOSCOPY N/A 02/25/2017   Procedure: COLONOSCOPY;  Surgeon: West Bali, MD;  [facility];  Service: Endoscopy;  Laterality: N/A;  10:30 Am   COLONOSCOPY WITH PROPOFOL N/A 04/15/2020   Procedure: COLONOSCOPY WITH PROPOFOL;  Surgeon: Lanelle Bal,  DO;  [facility];  Service: Endoscopy;  Laterality: N/A;  pm ASA II/   CORONARY ANGIOPLASTY WITH STENT PLACEMENT  06/19/2001   Cfx (3.5x41mm Zeta stent) & diagonal stenting (2.5x47mm pixel stent) w/moderate LAD disease (Dr. Bishop Limbo)    CORONARY ANGIOPLASTY WITH STENT PLACEMENT  03/17/2012   Xience DES (2.25x16mm) of mid-distal LAD (Dr. Bishop Limbo)   CORONARY STENT INTERVENTION N/A 06/26/2022   Procedure: CORONARY STENT INTERVENTION;  Surgeon: Swaziland, Peter M, MD;  Location: [facility] INVASIVE CV LAB;  Service: Cardiovascular;  Laterality: N/A;   LEFT HEART CATH AND CORONARY ANGIOGRAPHY N/A 06/26/2022    Procedure: LEFT HEART CATH AND CORONARY ANGIOGRAPHY;  Surgeon: Swaziland, Peter M, MD;  Location: [facility] INVASIVE CV LAB;  Service: Cardiovascular;  Laterality: N/A;   LEFT HEART CATHETERIZATION WITH CORONARY ANGIOGRAM N/A 03/17/2012   Procedure: LEFT HEART CATHETERIZATION WITH CORONARY ANGIOGRAM;  Surgeon: Chrystie Nose, MD;  Location: [facility] CATH LAB;  Service: Cardiovascular;  Laterality: N/A;   PERCUTANEOUS CORONARY STENT INTERVENTION (PCI-S)  03/17/2012   Procedure: PERCUTANEOUS CORONARY STENT INTERVENTION (PCI-S);  Surgeon: Chrystie Nose, MD;  Location: [facility] CATH LAB;  Service: Cardiovascular;;   POLYPECTOMY  02/25/2017   Procedure: POLYPECTOMY;  Surgeon: West Bali, MD;  [facility];  Service: Endoscopy;;  cecal x2; ascending x4;hepatic flexure;splenic flexure; descending x2;   POLYPECTOMY  04/15/2020   Procedure: POLYPECTOMY;  Surgeon: Lanelle Bal, DO;  [facility];  Service: Endoscopy;;  cecal    TRANSTHORACIC ECHOCARDIOGRAM  03/29/2006   EF normal; mild MR & TR; AV mildly sclerotic    Current Medications: Current Meds  Medication Sig   acetaminophen (TYLENOL) 500 MG tablet Take 500 mg by mouth every 6 (six) hours as needed for moderate pain.   albuterol (VENTOLIN HFA) 108 (90 Base) MCG/ACT inhaler Inhale 2 puffs into the lungs every 4 (four) hours as needed for wheezing or shortness of breath.   amLODipine (NORVASC) 5 MG tablet Take 5 mg by mouth daily.   aspirin EC 81 MG tablet Take 81 mg by mouth daily.   clopidogrel (PLAVIX) 75 MG tablet Take 1 tablet (75 mg total) by mouth daily.   gabapentin (NEURONTIN) 300 MG capsule Take 300 mg by mouth 3 (three) times daily as needed (pain).   loratadine (CLARITIN) 10 MG tablet Take 10 mg by mouth daily.   metoprolol tartrate (LOPRESSOR) 25 MG tablet Take 0.5 tablets (12.5 mg total) by mouth 2 (two) times daily.   nitroGLYCERIN (NITROSTAT) 0.4 MG SL tablet Place 1 tablet (0.4 mg total) under the tongue every 5 (five)  minutes as needed for chest pain (up to 3 doses).   rosuvastatin (CRESTOR) 20 MG tablet Take 1 tablet (20 mg total) by mouth daily.   [DISCONTINUED] rosuvastatin (CRESTOR) 40 MG tablet Take 1 tablet (40 mg total) by mouth daily.     Allergies:   Patient has no known allergies.   Social History   Socioeconomic History   Marital status: Married    Spouse name: Not on file   Number of children: 1   Years of education: Not on file   Highest education level: Not on file  Occupational History   Not on file  Tobacco Use   Smoking status: Every Day    Packs/day: 0.10    Years: 40.00    Additional pack years: 0.00    Total pack years: 4.00    Types: Cigarettes   Smokeless tobacco: Never  Tobacco comments:    smokes 3-4 cigarettes daily ("it all depends") 10/27/13  Vaping Use   Vaping Use: Never used  Substance and Sexual Activity   Alcohol use: No   Drug use: No   Sexual activity: Not on file  Other Topics Concern   Not on file  Social History Narrative   Not on file   Social Determinants of Health   Financial Resource Strain: Not on file  Food Insecurity: Not on file  Transportation Needs: Not on file  Physical Activity: Not on file  Stress: Not on file  Social Connections: Not on file     Family History: The patient's family history includes Heart disease in his brother; Stroke in his father.  ROS:   Please see the history of present illness.     All other systems reviewed and are negative.  EKGs/Labs/Other Studies Reviewed:    The following studies were reviewed today:  Cath 06/26/2022   Dist LAD lesion is 90% stenosed.   Lat 1st Diag lesion is 90% stenosed.   Prox RCA to Mid RCA lesion is 60% stenosed.   Previously placed Mid LAD to Dist LAD stent of unknown type is  widely patent.   Previously placed 1st Diag stent of unknown type is  widely patent.   Previously placed Prox Cx to Mid Cx stent of unknown type is  widely patent.   A drug-eluting stent was  successfully placed using a SYNERGY XD 2.25X12.   Post intervention, there is a 0% residual stenosis.   Post intervention, there is a 0% residual stenosis.   The left ventricular systolic function is normal.   LV end diastolic pressure is mildly elevated.   The left ventricular ejection fraction is 55-65% by visual estimate.   Continued patency of stents in the mid LAD, first diagonal, LCx. New culprit lesion in the distal LAD at distal stent margin. Sub branch of the fist diagonal is too small for PCI. The larger diagonal branch is patent. Overall good LV function with inferoapical wall motion abnormality from wrap around LAD Mildly elevated LVEDP 17 mm Hg Successful PCI of the distal LAD with DES x 1 overlapping with old stent   Plan: DAPT for one year. Anticipate DC in am.   EKG:  EKG is ordered today.  The ekg ordered today demonstrates normal sinus rhythm, T wave inversion in the anterior leads.  Recent Labs: 06/26/2022: B Natriuretic Peptide 91.0 06/27/2022: ALT 27; Hemoglobin 12.9; Magnesium 2.0; Platelets 215; TSH 0.931 07/06/2022: BUN 14; Creatinine, Ser 1.49; Potassium 5.2; Sodium 137  Recent Lipid Panel    Component Value Date/Time   CHOL 88 06/27/2022 0250   CHOL 103 10/31/2017 0909   TRIG 71 06/27/2022 0250   HDL 26 (L) 06/27/2022 0250   HDL 33 (L) 10/31/2017 0909   CHOLHDL 3.4 06/27/2022 0250   VLDL 14 06/27/2022 0250   LDLCALC 48 06/27/2022 0250   LDLCALC 54 10/31/2017 0909     Risk Assessment/Calculations:           Physical Exam:    VS:  BP 132/64 (BP Location: Left Arm, Patient Position: Sitting, Cuff Size: Normal)   Pulse 74   Ht 6' (1.829 m)   Wt 137 lb 12.8 oz (62.5 kg)   BMI 18.69 kg/m         Wt Readings from Last 3 Encounters:  07/06/22 137 lb 12.8 oz (62.5 kg)  06/27/22 143 lb 4.8 oz (65 kg)  12/25/21 157 lb  6.4 oz (71.4 kg)     GEN:  Well nourished, well developed in no acute distress HEENT: Normal NECK: No JVD; No carotid  bruits LYMPHATICS: No lymphadenopathy CARDIAC: RRR, no murmurs, rubs, gallops RESPIRATORY:  Clear to auscultation without rales, wheezing or rhonchi  ABDOMEN: Soft, non-tender, non-distended MUSCULOSKELETAL:  No edema; No deformity  SKIN: Warm and dry NEUROLOGIC:  Alert and oriented x 3 PSYCHIATRIC:  Normal affect   ASSESSMENT:    1. Coronary artery disease involving native coronary artery of native heart without angina pectoris   2. Stage 3a chronic kidney disease (HCC)   3. Hyperlipidemia, unspecified hyperlipidemia type   4. Medication management   5. Essential hypertension   6. Loss of appetite    PLAN:    In order of problems listed above:  CAD: Recently underwent DES to distal LAD.  Patient denies any chest discomfort.  He has been started on Plavix on top of aspirin.  Emphasis has been placed on compliance with dual antiplatelet therapy.  CKD stage III: Will obtain basic metabolic panel  Hyperlipidemia: He was previously on 10 mg daily of Crestor, the dose has recently been increased to 40 mg daily after last PCI.  Medication management: Obtain basic metabolic panel  Hypertension: Blood pressure stable  Loss of appetite: Since his PCI, he has completely lost all appetite.  The only 2 new medications are Plavix and higher dose of Crestor.  I will lower Crestor to 20 mg daily to see if his symptoms improve.    Cardiac Rehabilitation Eligibility Assessment  The patient is ready to start cardiac rehabilitation from a cardiac standpoint.          Medication Adjustments/Labs and Tests Ordered: Current medicines are reviewed at length with the patient today.  Concerns regarding medicines are outlined above.  Orders Placed This Encounter  Procedures   Lipid panel   Hepatic function panel   Basic Metabolic Panel (BMET)   EKG 12-Lead   Meds ordered this encounter  Medications   rosuvastatin (CRESTOR) 20 MG tablet    Sig: Take 1 tablet (20 mg total) by mouth daily.     Dispense:  90 tablet    Refill:  3    Patient Instructions  Medication Instructions:  Your physician has recommended you make the following change in your medication:  DECREASE: Crestor 20 mg once daily *If you need a refill on your cardiac medications before your next appointment, please call your pharmacy*   Lab Work: Your physician recommends that you return for lab work: Today: BMET In 4 weeks: Lipids, LFTs If you have labs (blood work) drawn today and your tests are completely normal, you will receive your results only by: MyChart Message (if you have MyChart) OR A paper copy in the mail If you have any lab test that is abnormal or we need to change your treatment, we will call you to review the results.   Testing/Procedures: None   Follow-Up: At [facility], you and your health needs are our priority.  As part of our continuing mission to provide you with exceptional heart care, we have created designated Provider Care Teams.  These Care Teams include your primary Cardiologist (physician) and Advanced Practice Providers (APPs -  Physician Assistants and Nurse Practitioners) who all work together to provide you with the care you need, when you need it.     Your next appointment:   3-4 month(s)  Provider:   Chrystie Nose, MD  Ramond Dial, [place]  07/07/2022 8:07 PM    [facility]

## 2022-07-07 ENCOUNTER — Encounter: Payer: Self-pay | Admitting: Physician Assistant

## 2022-07-07 LAB — BASIC METABOLIC PANEL
BUN/Creatinine Ratio: 9 — ABNORMAL LOW (ref 10–24)
BUN: 14 mg/dL (ref 8–27)
CO2: 21 mmol/L (ref 20–29)
Calcium: 9.3 mg/dL (ref 8.6–10.2)
Chloride: 100 mmol/L (ref 96–106)
Creatinine, Ser: 1.49 mg/dL — ABNORMAL HIGH (ref 0.76–1.27)
Glucose: 99 mg/dL (ref 70–99)
Potassium: 5.2 mmol/L (ref 3.5–5.2)
Sodium: 137 mmol/L (ref 134–144)
eGFR: 50 mL/min/{1.73_m2} — ABNORMAL LOW (ref >59)

## 2022-07-12 ENCOUNTER — Other Ambulatory Visit (HOSPITAL_BASED_OUTPATIENT_CLINIC_OR_DEPARTMENT_OTHER): Payer: Self-pay

## 2022-07-12 DIAGNOSIS — N1831 Chronic kidney disease, stage 3a: Secondary | ICD-10-CM

## 2022-07-18 ENCOUNTER — Other Ambulatory Visit (HOSPITAL_COMMUNITY): Payer: Self-pay

## 2022-07-21 DIAGNOSIS — I10 Essential (primary) hypertension: Secondary | ICD-10-CM | POA: Diagnosis not present

## 2022-07-21 DIAGNOSIS — I2510 Atherosclerotic heart disease of native coronary artery without angina pectoris: Secondary | ICD-10-CM | POA: Diagnosis not present

## 2022-08-03 DIAGNOSIS — Z79899 Other long term (current) drug therapy: Secondary | ICD-10-CM | POA: Diagnosis not present

## 2022-08-03 DIAGNOSIS — E785 Hyperlipidemia, unspecified: Secondary | ICD-10-CM | POA: Diagnosis not present

## 2022-08-04 LAB — LIPID PANEL
Chol/HDL Ratio: 2.3 ratio (ref 0.0–5.0)
Cholesterol, Total: 119 mg/dL (ref 100–199)
HDL: 52 mg/dL (ref >39)
LDL Chol Calc (NIH): 51 mg/dL (ref 0–99)
Triglycerides: 83 mg/dL (ref 0–149)
VLDL Cholesterol Cal: 16 mg/dL (ref 5–40)

## 2022-08-04 LAB — HEPATIC FUNCTION PANEL
ALT: 13 IU/L (ref 0–44)
AST: 19 IU/L (ref 0–40)
Albumin: 4.2 g/dL (ref 3.8–4.8)
Alkaline Phosphatase: 87 IU/L (ref 44–121)
Bilirubin Total: 0.6 mg/dL (ref 0.0–1.2)
Bilirubin, Direct: 0.19 mg/dL (ref 0.00–0.40)
Total Protein: 6.6 g/dL (ref 6.0–8.5)

## 2022-08-07 ENCOUNTER — Other Ambulatory Visit: Payer: Self-pay

## 2022-08-07 DIAGNOSIS — N1831 Chronic kidney disease, stage 3a: Secondary | ICD-10-CM

## 2022-08-08 DIAGNOSIS — H52223 Regular astigmatism, bilateral: Secondary | ICD-10-CM | POA: Diagnosis not present

## 2022-08-08 DIAGNOSIS — H40023 Open angle with borderline findings, high risk, bilateral: Secondary | ICD-10-CM | POA: Diagnosis not present

## 2022-08-08 DIAGNOSIS — H5213 Myopia, bilateral: Secondary | ICD-10-CM | POA: Diagnosis not present

## 2022-08-08 DIAGNOSIS — H2513 Age-related nuclear cataract, bilateral: Secondary | ICD-10-CM | POA: Diagnosis not present

## 2022-08-08 DIAGNOSIS — H524 Presbyopia: Secondary | ICD-10-CM | POA: Diagnosis not present

## 2022-08-20 DIAGNOSIS — I10 Essential (primary) hypertension: Secondary | ICD-10-CM | POA: Diagnosis not present

## 2022-08-20 DIAGNOSIS — I2510 Atherosclerotic heart disease of native coronary artery without angina pectoris: Secondary | ICD-10-CM | POA: Diagnosis not present

## 2022-09-11 DIAGNOSIS — N1831 Chronic kidney disease, stage 3a: Secondary | ICD-10-CM | POA: Diagnosis not present

## 2022-09-20 DIAGNOSIS — I2510 Atherosclerotic heart disease of native coronary artery without angina pectoris: Secondary | ICD-10-CM | POA: Diagnosis not present

## 2022-09-20 DIAGNOSIS — I10 Essential (primary) hypertension: Secondary | ICD-10-CM | POA: Diagnosis not present

## 2022-10-19 DIAGNOSIS — F17200 Nicotine dependence, unspecified, uncomplicated: Secondary | ICD-10-CM | POA: Diagnosis not present

## 2022-10-19 DIAGNOSIS — I10 Essential (primary) hypertension: Secondary | ICD-10-CM | POA: Diagnosis not present

## 2022-10-19 DIAGNOSIS — N390 Urinary tract infection, site not specified: Secondary | ICD-10-CM | POA: Diagnosis not present

## 2022-10-22 DIAGNOSIS — R8290 Unspecified abnormal findings in urine: Secondary | ICD-10-CM | POA: Diagnosis not present

## 2022-11-21 DIAGNOSIS — E559 Vitamin D deficiency, unspecified: Secondary | ICD-10-CM | POA: Diagnosis not present

## 2022-11-21 DIAGNOSIS — I2510 Atherosclerotic heart disease of native coronary artery without angina pectoris: Secondary | ICD-10-CM | POA: Diagnosis not present

## 2022-11-21 DIAGNOSIS — N390 Urinary tract infection, site not specified: Secondary | ICD-10-CM | POA: Diagnosis not present

## 2022-11-22 ENCOUNTER — Ambulatory Visit: Payer: Medicare Other | Admitting: Urology

## 2022-11-23 ENCOUNTER — Ambulatory Visit: Payer: Medicare Other | Admitting: Urology

## 2022-11-23 VITALS — BP 119/73 | HR 69

## 2022-11-23 DIAGNOSIS — N390 Urinary tract infection, site not specified: Secondary | ICD-10-CM | POA: Diagnosis not present

## 2022-11-23 DIAGNOSIS — R972 Elevated prostate specific antigen [PSA]: Secondary | ICD-10-CM | POA: Diagnosis not present

## 2022-11-23 LAB — URINALYSIS, ROUTINE W REFLEX MICROSCOPIC
Bilirubin, UA: NEGATIVE
Glucose, UA: NEGATIVE
Ketones, UA: NEGATIVE
Nitrite, UA: POSITIVE — AB
Specific Gravity, UA: 1.01 (ref 1.005–1.030)
Urobilinogen, Ur: 2 mg/dL — ABNORMAL HIGH (ref 0.2–1.0)
pH, UA: 6 (ref 5.0–7.5)

## 2022-11-23 LAB — MICROSCOPIC EXAMINATION
RBC, Urine: >30 /[HPF] — AB (ref 0–2)
WBC, UA: >30 /[HPF] — AB (ref 0–5)

## 2022-11-23 LAB — BLADDER SCAN AMB NON-IMAGING: Scan Result: 10

## 2022-11-23 MED ORDER — TAMSULOSIN HCL 0.4 MG PO CAPS: 0.4000 mg | ORAL_CAPSULE | Freq: Every day | ORAL | 11 refills | Status: DC

## 2022-11-23 MED ORDER — SULFAMETHOXAZOLE-TRIMETHOPRIM 800-160 MG PO TABS
1.0000 | ORAL_TABLET | Freq: Two times a day (BID) | ORAL | 0 refills | Status: DC
Start: 2022-11-23 — End: 2023-02-01

## 2022-11-23 NOTE — Progress Notes (Signed)
post void residual=10 

## 2022-11-23 NOTE — Progress Notes (Signed)
11/23/2022 11:07 AM   Robert Shaw [birth year] [id number]  Referring provider: Benetta Spar, MD [address] [place],  [place] [address]  Elevated PSA   HPI: [name] is a 72yo here for evaluation of elevated PSA. PSA was 25 in 10/2022. He was treated with a UTI at this time. He has having dysuria at that time. He does not recall previous PSA levels. IPSS 20 QOL 5. He was given an antibotic per the patient. His LUTS have not improved with antibiotic therapy.    PMH: Past Medical History:  Diagnosis Date   Bradycardia 03/18/2012   Coronary artery disease    Dyslipidemia    Family history of heart disease    History of nuclear stress test 07/13/2008   dipyridamole; normal perfusion, no ischemia, low risk    Hypertension    NSTEMI (non-ST elevated myocardial infarction) (HCC)    h/o   Tobacco abuse     Surgical History: Past Surgical History:  Procedure Laterality Date   CARDIAC CATHETERIZATION  04/2001   after high lateral MI, preserved LV function w/mild mid anterolateral hypokinesia, diffuse coronary irregularity with signficiant obstruction in LAD, OM1, superior branch of ramus (Dr. Baxter Hire - [facility])   CARDIAC CATHETERIZATION  09/23/2001   patent stents from 06/2001 (Dr. Evlyn Courier)   CARDIAC CATHETERIZATION  09/17/2002   no restenosis at prior intervention sites (Dr. Bishop Limbo)   CARDIAC CATHETERIZATION  09/09/2009   no significant CAD, patent stents in OM1 & LAD (Dr. Bishop Limbo)    COLONOSCOPY N/A 02/25/2017   Procedure: COLONOSCOPY;  Surgeon: West Bali, MD;  [facility];  Service: Endoscopy;  Laterality: N/A;  10:30 Am   COLONOSCOPY WITH PROPOFOL N/A 04/15/2020   Procedure: COLONOSCOPY WITH PROPOFOL;  Surgeon: Lanelle Bal, DO;  [facility];  Service: Endoscopy;  Laterality: N/A;  pm ASA II/   CORONARY ANGIOPLASTY WITH STENT PLACEMENT  06/19/2001   Cfx (3.5x39mm Zeta stent) & diagonal  stenting (2.5x56mm pixel stent) w/moderate LAD disease (Dr. Bishop Limbo)    CORONARY ANGIOPLASTY WITH STENT PLACEMENT  03/17/2012   Xience DES (2.25x53mm) of mid-distal LAD (Dr. Bishop Limbo)   CORONARY STENT INTERVENTION N/A 06/26/2022   Procedure: CORONARY STENT INTERVENTION;  Surgeon: Swaziland, Peter M, MD;  Location[facility] INVASIVE CV LAB;  Service: Cardiovascular;  Laterality: N/A;   LEFT HEART CATH AND CORONARY ANGIOGRAPHY N/A 06/26/2022   Procedure: LEFT HEART CATH AND CORONARY ANGIOGRAPHY;  Surgeon: Swaziland, Peter M, MD;  Location: [facility] INVASIVE CV LAB;  Service: Cardiovascular;  Laterality: N/A;   LEFT HEART CATHETERIZATION WITH CORONARY ANGIOGRAM N/A 03/17/2012   Procedure: LEFT HEART CATHETERIZATION WITH CORONARY ANGIOGRAM;  Surgeon: Chrystie Nose, MD;  Location: [facility] CATH LAB;  Service: Cardiovascular;  Laterality: N/A;   PERCUTANEOUS CORONARY STENT INTERVENTION (PCI-S)  03/17/2012   Procedure: PERCUTANEOUS CORONARY STENT INTERVENTION (PCI-S);  Surgeon: Chrystie Nose, MD;  Location: [facility] CATH LAB;  Service: Cardiovascular;;   POLYPECTOMY  02/25/2017   Procedure: POLYPECTOMY;  Surgeon: West Bali, MD;  [facility];  Service: Endoscopy;;  cecal x2; ascending x4;hepatic flexure;splenic flexure; descending x2;   POLYPECTOMY  04/15/2020   Procedure: POLYPECTOMY;  Surgeon: Lanelle Bal, DO;  [facility];  Service: Endoscopy;;  cecal    TRANSTHORACIC ECHOCARDIOGRAM  03/29/2006   EF normal; mild MR & TR; AV mildly sclerotic    Home Medications:  Allergies as of 11/23/2022  No Known Allergies      Medication List        Accurate as of November 23, 2022 11:07 AM. If you have any questions, ask your nurse or doctor.          acetaminophen 500 MG tablet Commonly known as: TYLENOL Take 500 mg by mouth every 6 (six) hours as needed for moderate pain.   albuterol 108 (90 Base) MCG/ACT inhaler Commonly known as: VENTOLIN HFA Inhale 2 puffs into the lungs every 4 (four)  hours as needed for wheezing or shortness of breath.   amLODipine 5 MG tablet Commonly known as: NORVASC Take 5 mg by mouth daily.   aspirin EC 81 MG tablet Take 81 mg by mouth daily.   clopidogrel 75 MG tablet Commonly known as: PLAVIX Take 1 tablet (75 mg total) by mouth daily.   gabapentin 300 MG capsule Commonly known as: NEURONTIN Take 300 mg by mouth 3 (three) times daily as needed (pain).   loratadine 10 MG tablet Commonly known as: CLARITIN Take 10 mg by mouth daily.   metoprolol tartrate 25 MG tablet Commonly known as: LOPRESSOR Take 0.5 tablets (12.5 mg total) by mouth 2 (two) times daily.   nitroGLYCERIN 0.4 MG SL tablet Commonly known as: NITROSTAT Place 1 tablet (0.4 mg total) under the tongue every 5 (five) minutes as needed for chest pain (up to 3 doses).   rosuvastatin 20 MG tablet Commonly known as: CRESTOR Take 1 tablet (20 mg total) by mouth daily.        Allergies: No Known Allergies  Family History: Family History  Problem Relation Age of Onset   Heart disease Brother        x3   Stroke Father     Social History:  reports that he has been smoking cigarettes. He has a 4 pack-year smoking history. He has never used smokeless tobacco. He reports that he does not drink alcohol and does not use drugs.  ROS: All other review of systems were reviewed and are negative except what is noted above in HPI  Physical Exam: BP 119/73   Pulse 69   Constitutional:  Alert and oriented, No acute distress. HEENT: [place] AT, moist mucus membranes.  Trachea midline, no masses. Cardiovascular: No clubbing, cyanosis, or edema. Respiratory: Normal respiratory effort, no increased work of breathing. GI: Abdomen is soft, nontender, nondistended, no abdominal masses GU: No CVA tenderness. Circumcised phallus. No masses/lesions on penis, testis, scrotum. Prostate 40g smooth no nodules no induration.  Lymph: No cervical or inguinal lymphadenopathy. Skin: No rashes,  bruises or suspicious lesions. Neurologic: Grossly intact, no focal deficits, moving all 4 extremities. Psychiatric: Normal mood and affect.  Laboratory Data: Lab Results  Component Value Date   WBC 5.6 09/11/2022   HGB 11.8 (L) 09/11/2022   HCT 35.4 (L) 09/11/2022   MCV 89 09/11/2022   PLT 219 09/11/2022    Lab Results  Component Value Date   CREATININE 1.34 (H) 09/11/2022    No results found for: "PSA"  No results found for: "TESTOSTERONE"  Lab Results  Component Value Date   HGBA1C 5.9 (H) 06/26/2022    Urinalysis    Component Value Date/Time   COLORURINE YELLOW 06/17/2007 1054   APPEARANCEUR CLEAR 06/17/2007 1054   LABSPEC 1.010 06/17/2007 1054   PHURINE 7.0 06/17/2007 1054   GLUCOSEU NEGATIVE 06/17/2007 1054   HGBUR NEGATIVE 06/17/2007 1054   BILIRUBINUR NEGATIVE 06/17/2007 1054   KETONESUR NEGATIVE 06/17/2007 1054   PROTEINUR NEGATIVE  06/17/2007 1054   UROBILINOGEN 0.2 06/17/2007 1054   NITRITE NEGATIVE 06/17/2007 1054   LEUKOCYTESUR  06/17/2007 1054    NEGATIVE MICROSCOPIC NOT DONE ON URINES WITH NEGATIVE PROTEIN, BLOOD, LEUKOCYTES, NITRITE, OR GLUCOSE <1000 mg/dL.    No results found for: "LABMICR", "WBCUA", "RBCUA", "LABEPIT", "MUCUS", "BACTERIA"  Pertinent Imaging:  No results found for this or any previous visit.  No results found for this or any previous visit.  No results found for this or any previous visit.  No results found for this or any previous visit.  No results found for this or any previous visit.  No valid procedures specified. No results found for this or any previous visit.  No results found for this or any previous visit.   Assessment & Plan:    1. Elevated PSA The patient and I talked about etiologies of elevated PSA.  We discussed the possible relationship between elevated PSA and prostate cancer, BPH, prostatitis, infection trauma and recent ejaculations.  Patient currently has symptoms of prostatitis. We will treat  with bactrim for 28 days and start flomax 0.4mg  BID. We will recheck his PSA in 6-8 weeks - Urinalysis, Routine w reflex microscopic - BLADDER SCAN AMB NON-IMAGING   No follow-ups on file.  Wilkie Aye, MD  [facility] [place]

## 2022-11-27 ENCOUNTER — Encounter: Payer: Self-pay | Admitting: Urology

## 2022-11-27 LAB — URINE CULTURE

## 2022-11-27 NOTE — Patient Instructions (Signed)

## 2022-11-28 ENCOUNTER — Other Ambulatory Visit (HOSPITAL_COMMUNITY): Payer: Self-pay | Admitting: Gerontology

## 2022-11-28 DIAGNOSIS — N390 Urinary tract infection, site not specified: Secondary | ICD-10-CM | POA: Diagnosis not present

## 2022-11-28 DIAGNOSIS — F17200 Nicotine dependence, unspecified, uncomplicated: Secondary | ICD-10-CM | POA: Diagnosis not present

## 2022-11-28 DIAGNOSIS — F17210 Nicotine dependence, cigarettes, uncomplicated: Secondary | ICD-10-CM

## 2022-11-28 DIAGNOSIS — I10 Essential (primary) hypertension: Secondary | ICD-10-CM | POA: Diagnosis not present

## 2022-11-29 ENCOUNTER — Ambulatory Visit: Payer: Medicare Other | Admitting: Urology

## 2022-11-29 ENCOUNTER — Other Ambulatory Visit (HOSPITAL_COMMUNITY): Payer: Self-pay

## 2022-12-13 ENCOUNTER — Ambulatory Visit: Payer: Medicare Other | Attending: Internal Medicine | Admitting: Internal Medicine

## 2022-12-13 ENCOUNTER — Encounter: Payer: Self-pay | Admitting: Internal Medicine

## 2022-12-13 VITALS — BP 124/62 | HR 57 | Ht 72.0 in | Wt 136.2 lb

## 2022-12-13 DIAGNOSIS — R634 Abnormal weight loss: Secondary | ICD-10-CM

## 2022-12-13 DIAGNOSIS — R630 Anorexia: Secondary | ICD-10-CM

## 2022-12-13 DIAGNOSIS — I10 Essential (primary) hypertension: Secondary | ICD-10-CM | POA: Diagnosis not present

## 2022-12-13 DIAGNOSIS — R0609 Other forms of dyspnea: Secondary | ICD-10-CM | POA: Diagnosis not present

## 2022-12-13 MED ORDER — ROSUVASTATIN CALCIUM 20 MG PO TABS: 20.0000 mg | ORAL_TABLET | Freq: Every day | ORAL | 3 refills | Status: DC

## 2022-12-13 NOTE — Patient Instructions (Signed)
Medication Instructions:  None  *If you need a refill on your cardiac medications before your next appointment, please call your pharmacy*   Lab Work: BMET  If you have labs (blood work) drawn today and your tests are completely normal, you will receive your results only by: MyChart Message (if you have MyChart) OR A paper copy in the mail If you have any lab test that is abnormal or we need to change your treatment, we will call you to review the results.   Testing/Procedures: DR.Hilty has ordered a Ct chest abdomen pelvis this will be scheduled at [facility]    Follow-Up: At [facility], you and your health needs are our priority.  As part of our continuing mission to provide you with exceptional heart care, we have created designated Provider Care Teams.  These Care Teams include your primary Cardiologist (physician) and Advanced Practice Providers (APPs -  Physician Assistants and Nurse Practitioners) who all work together to provide you with the care you need, when you need it.  We recommend signing up for the patient portal called "MyChart".  Sign up information is provided on this After Visit Summary.  MyChart is used to connect with patients for Virtual Visits (Telemedicine).  Patients are able to view lab/test results, encounter notes, upcoming appointments, etc.  Non-urgent messages can be sent to your provider as well.   To learn more about what you can do with MyChart, go to [email or web address].    Your next appointment:   2-3 month(s) DR. Hilty or NP  Provider:   Chrystie Nose, MD

## 2022-12-13 NOTE — Progress Notes (Signed)
OFFICE NOTE  Chief Complaint:  Decreased appetite, weight loss  Primary Care Physician: Benetta Spar, MD  HPI:  Robert Shaw is a 72 year old gentleman whom I have been following for a history of coronary disease. He had a stent in his circumflex and diagonal in 2003 and had moderate disease in the LAD and the right coronary. He continues to smoke, which I have talked to him about as a marked risk factor, and he has been working on some exercise. Unfortunately, on January 31 he had an episode when he developed chest pain, somewhat after eating with some shortness of breath, that went to both arms. He took some aspirin at home and the pain did not go away. He, therefore, presented to the emergency department. He was found to have an elevated troponin of 0.72 and underwent cardiac catheterization. That catheterization, performed by me, showed a severe discrete 95% stenosis in the mid LAD. Both the stents in the diagonal and circumflex were patent with minimal in-stent restenosis. The EF was greater than 60% with no focal wall motion abnormalities. The patient then underwent a Xience drug-eluting stent placement to the mid LAD on March 17, 2012, by Dr. Tresa Endo. This was successful at reducing the lesion to 0% stenosis.  Since his hospitalization he has done very well. He has had no further chest pain. He was switched successfully from Plavix over to Brilinta, which he is taking with low-dose aspirin. His only complaint now is that he is having some numbness and 2 toes on his right foot. He reports he had problems with his right leg after the catheterization saying that he developed a foot drop but that has progressively gotten better over the past year.  He had no hematoma, bruit or catheterization complications that we were aware of at the time. Unfortunately, he continues to smoke, which is an ongoing risk factor.  Robert Shaw returns today for followup. He is doing well denies any chest  pain or worsening shortness of breath. He continues on aspirin and Brillinta. During his last office visit we had recommended starting Chantix for smoking cessation however he has not started that medication. He continues to smoke several cigarettes a day. He has no good reason for why he did not start the medication. He is also anticipating undergoing a switched to Medicare this year. Recent laboratory work was performed by his primary care provider which showed an impaired fasting glucose at 101 and hemoglobin A1c of 6.1 suggesting he may be prediabetic. His creatinine is 1.4. Lipid profile demonstrates total cholesterol 107, HDL 37, triglycerides 95 and LDL 51.  I saw Robert Shaw back in the office today. He is currently without any complaints. He denies any chest pain or worsening shortness of breath. Unfortunately has not been able to stop smoking but he is down to about 2 cigarettes a day. As mentioned above his last PCI was in February 2014. He remains on dual antiplatelets therapy with Brilinta, this was for non-STEMI. The data with long-term therapy of Brilinta does not go out much past 2 years therefore we may consider having him come off of this medication.  09/14/2015  Robert Shaw returns today for follow-up. He is without complaints. Overall he is doing really well. Denies any shortness of breath or chest pain. He remains physically active. He had recent cholesterol testing through his primary care provider which showed LDL less than 70. He is on Crestor 10 mg daily. Blood pressure is at goal.  10/08/2016  Robert Shaw was seen today in follow-up. This is an annual visit. Overall he is asymptomatic. He denies any chest pain or worsening shortness of breath. 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 He is a blood pressure is well-controlled today. He has not had a recent lipid check. His EKG was personally reviewed and shows stable anterolateral T-wave inversions in V3 through V6 as well as high  lateral leads of 1 and aVL. He's not had any coronary events that were aware of since 2014.  10/31/2017  Robert Shaw returns for annual visit.  Overall continues to do well.  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  Denies any chest pain or worsening shortness of breath.  He is maintained on low-dose aspirin, metoprolol and Crestor.  He is due for repeat lipid profile as his last study was a year ago.  He has not had any recent testing.  EKG personally reviewed demonstrates stable lateral T wave inversions.  10/29/2019  Robert Shaw is seen today in follow-up with his wife.  He continues to do well and is asymptomatic.  He denies chest pain or shortness of breath.  He is noted to have some bradycardia today.  He was previously on metoprolol 12-1/2 twice daily but is now been taking a full tablet at night.  Heart rates in the 40s today but he is asymptomatic.  Lipids have been well controlled with a total cholesterol 104, HDL 36, LDL 52 and triglycerides 77.  Blood pressure is at goal today as well at 120/80.  12/28/2020  Robert Shaw returns today for follow-up.  Blood pressure is excellent 128/80.  He denies any chest pain or shortness of breath.  EKG shows a normal sinus rhythm.  He still has anterolateral T wave inversions which were present previously.  He has well-controlled lipids on a statin with LDL of 59.  A1c 5.7.  Of interest his creatinine was elevated at 1.6 in July.  He says he had no knowledge of this.  He is advised to follow-up with his PCP regarding this.  12/25/2021  Robert Shaw is seen today in follow-up.  Overall he says he is feeling pretty well.  He Nuys any chest pain or shortness of breath.  Unfortunate continues to smoke.  Has not been able to cut back on this much.  He is blood pressure is reasonably well controlled today.  EKG shows a sinus bradycardia and unchanged from prior EKGs.  His lipids were in August showing total cholesterol 122, HDL  44, triglycerides 74 and LDL 63 which is at goal.  40/98/1191  Robert Shaw is seen today in follow-up. His main concern is that he has been losing weight unintentionally. He was recently placed on Megace by his PCP for this. He has also had some exertional dyspnea. Recently he had an NSTEMI in 06/2022. Cardiac cath showed distal LAD disease and he was stented. After that he noted some symptomatic improvement, but subsequently he has continued to decline. He is frustrated that we have not identified the cause of his significant weight loss, although by the scales he is only about 1 lb less than back in May. He says his appetite is poor. He denies nausea, vomiting, diarrhea, fever or consitutional symptoms. He has a colonoscopy a few years ago which showed some polyps. He had a recent bladder infection which was treated. He is a former smoker but has had yearly lung cancer  screening CT's which have been negative.  PMHx:  Past Medical History:  Diagnosis Date   Bradycardia 03/18/2012   Coronary artery disease    Dyslipidemia    Family history of heart disease    History of nuclear stress test 07/13/2008   dipyridamole; normal perfusion, no ischemia, low risk    Hypertension    NSTEMI (non-ST elevated myocardial infarction) (HCC)    h/o   Tobacco abuse     Past Surgical History:  Procedure Laterality Date   CARDIAC CATHETERIZATION  04/2001   after high lateral MI, preserved LV function w/mild mid anterolateral hypokinesia, diffuse coronary irregularity with signficiant obstruction in LAD, OM1, superior branch of ramus (Dr. Baxter Hire - [facility])   CARDIAC CATHETERIZATION  09/23/2001   patent stents from 06/2001 (Dr. Evlyn Courier)   CARDIAC CATHETERIZATION  09/17/2002   no restenosis at prior intervention sites (Dr. Bishop Limbo)   CARDIAC CATHETERIZATION  09/09/2009   no significant CAD, patent stents in OM1 & LAD (Dr. Bishop Limbo)    COLONOSCOPY N/A 02/25/2017   Procedure:  COLONOSCOPY;  Surgeon: West Bali, MD;  [facility];  Service: Endoscopy;  Laterality: N/A;  10:30 Am   COLONOSCOPY WITH PROPOFOL N/A 04/15/2020   Procedure: COLONOSCOPY WITH PROPOFOL;  Surgeon: Lanelle Bal, DO;  [facility];  Service: Endoscopy;  Laterality: N/A;  pm ASA II/   CORONARY ANGIOPLASTY WITH STENT PLACEMENT  06/19/2001   Cfx (3.5x64mm Zeta stent) & diagonal stenting (2.5x41mm pixel stent) w/moderate LAD disease (Dr. Bishop Limbo)    CORONARY ANGIOPLASTY WITH STENT PLACEMENT  03/17/2012   Xience DES (2.25x83mm) of mid-distal LAD (Dr. Bishop Limbo)   CORONARY STENT INTERVENTION N/A 06/26/2022   Procedure: CORONARY STENT INTERVENTION;  Surgeon: Swaziland, Peter M, MD;  Location: [facility] INVASIVE CV LAB;  Service: Cardiovascular;  Laterality: N/A;   LEFT HEART CATH AND CORONARY ANGIOGRAPHY N/A 06/26/2022   Procedure: LEFT HEART CATH AND CORONARY ANGIOGRAPHY;  Surgeon: Swaziland, Peter M, MD;  Location: [facility] INVASIVE CV LAB;  Service: Cardiovascular;  Laterality: N/A;   LEFT HEART CATHETERIZATION WITH CORONARY ANGIOGRAM N/A 03/17/2012   Procedure: LEFT HEART CATHETERIZATION WITH CORONARY ANGIOGRAM;  Surgeon: Chrystie Nose, MD;  Location: [facility] CATH LAB;  Service: Cardiovascular;  Laterality: N/A;   PERCUTANEOUS CORONARY STENT INTERVENTION (PCI-S)  03/17/2012   Procedure: PERCUTANEOUS CORONARY STENT INTERVENTION (PCI-S);  Surgeon: Chrystie Nose, MD;  Location: [facility] CATH LAB;  Service: Cardiovascular;;   POLYPECTOMY  02/25/2017   Procedure: POLYPECTOMY;  Surgeon: West Bali, MD;  [facility];  Service: Endoscopy;;  cecal x2; ascending x4;hepatic flexure;splenic flexure; descending x2;   POLYPECTOMY  04/15/2020   Procedure: POLYPECTOMY;  Surgeon: Lanelle Bal, DO;  [facility];  Service: Endoscopy;;  cecal    TRANSTHORACIC ECHOCARDIOGRAM  03/29/2006   EF normal; mild MR & TR; AV mildly sclerotic    FAMHx:  Family History  Problem Relation Age of Onset    Heart disease Brother        x3   Stroke Father     SOCHx:   reports that he has been smoking cigarettes. He has a 4 pack-year smoking history. He has never used smokeless tobacco. He reports that he does not drink alcohol and does not use drugs.  ALLERGIES:  No Known Allergies  ROS: Pertinent items noted in HPI and remainder of comprehensive ROS otherwise negative.  HOME MEDS: Current Outpatient Medications  Medication Sig Dispense Refill   acetaminophen (TYLENOL) 500 MG tablet Take 500 mg by mouth every 6 (six) hours as needed for moderate pain.     albuterol (VENTOLIN HFA) 108 (90 Base) MCG/ACT inhaler Inhale 2 puffs into the lungs every 4 (four) hours as needed for wheezing or shortness of breath.     amLODipine (NORVASC) 5 MG tablet Take 5 mg by mouth daily.     aspirin EC 81 MG tablet Take 81 mg by mouth daily.     clopidogrel (PLAVIX) 75 MG tablet Take 1 tablet (75 mg total) by mouth daily. 30 tablet 11   gabapentin (NEURONTIN) 300 MG capsule Take 300 mg by mouth 3 (three) times daily as needed (pain).     loratadine (CLARITIN) 10 MG tablet Take 10 mg by mouth daily.     metoprolol tartrate (LOPRESSOR) 25 MG tablet Take 0.5 tablets (12.5 mg total) by mouth 2 (two) times daily. 30 tablet 5   nitroGLYCERIN (NITROSTAT) 0.4 MG SL tablet Place 1 tablet (0.4 mg total) under the tongue every 5 (five) minutes as needed for chest pain (up to 3 doses). 25 tablet 3   sulfamethoxazole-trimethoprim (BACTRIM DS) 800-160 MG tablet Take 1 tablet by mouth every 12 (twelve) hours. 56 tablet 0   tamsulosin (FLOMAX) 0.4 MG CAPS capsule Take 1 capsule (0.4 mg total) by mouth daily after supper. 30 capsule 11   rosuvastatin (CRESTOR) 20 MG tablet Take 1 tablet (20 mg total) by mouth daily. 90 tablet 3   No current facility-administered medications for this visit.    LABS/IMAGING: No results found for this or any previous visit (from the past 48 hour(s)). No results found.  VITALS: BP 124/62  (BP Location: Right Arm, Patient Position: Sitting, Cuff Size: Normal)   Pulse (!) 57   Ht 6' (1.829 m)   Wt 136 lb 3.2 oz (61.8 kg)   SpO2 99%   BMI 18.47 kg/m   EXAM: General appearance: alert and no distress Neck: no carotid bruit and no JVD Lungs: clear to auscultation bilaterally Heart: regular rate and rhythm, S1, S2 normal, no murmur, click, rub or gallop Abdomen: soft, non-tender; bowel sounds normal; no masses,  no organomegaly Extremities: extremities normal, atraumatic, no cyanosis or edema Pulses: 2+ and symmetric Skin: Skin color, texture, turgor normal. No rashes or lesions Neurologic: Grossly normal Psych: Mood, affect normal  EKG: EKG Interpretation Date/Time:  Thursday December 13 2022 14:04:41 EDT Ventricular Rate:  57 PR Interval:  174 QRS Duration:  98 QT Interval:  432 QTC Calculation: 420 R Axis:   46  Text Interpretation: Sinus bradycardia Septal infarct , age undetermined ST & T wave abnormality, consider inferolateral ischemia When compared with ECG of 26-Jun-2022 17:20, Left anterior fascicular block is no longer Present T wave inversion less evident in Anterior leads QT has shortened Confirmed by Zoila Shutter [phone]) on 12/13/2022 2:07:30 PM    ASSESSMENT: Coronary artery disease status post PCI to the mid to distal LAD with a Xience DES (03/2012) and repeat PCI to the distal LAD (06/2022) Dyslipidemia - at goal Hypertension - controlled Tobacco dependence - not ready to quit Anterolateral and inferior T-wave abnormalities-stable  PLAN: 1.   Robert Shaw has been concerned about unintentional weight loss and loss of appetite. He also feels fatigued and short of breath with exertion. He only noted mild symptomatic improvement after stenting in May for NSTEMI. His symptoms are concerning for possible malignancy. I will order a contrast CT of the  chest, abdomen and pelvis for screening. If that is unremarkable, we could consider other cardiac causes of  his fatigue and dyspnea, however, I think that this is much less likely.   Follow-up with me or APP in 2-3 months.  Chrystie Nose, MD, [facility], FACP  [place]  [facility]  Medical Director of the [facility]  Cardiovascular Risk Reduction Clinic Diplomate of the American Board of Clinical Lipidology Attending Cardiologist  Direct Dial: [phone]  Fax: [phone]  Website:  [email or web address] Nicely Dalvin Clipper 12/13/2022, 2:07 PM

## 2022-12-14 ENCOUNTER — Telehealth: Payer: Self-pay | Admitting: Internal Medicine

## 2022-12-14 LAB — BASIC METABOLIC PANEL
BUN/Creatinine Ratio: 8 — ABNORMAL LOW (ref 10–24)
BUN: 14 mg/dL (ref 8–27)
CO2: 19 mmol/L — ABNORMAL LOW (ref 20–29)
Calcium: 9.2 mg/dL (ref 8.6–10.2)
Chloride: 106 mmol/L (ref 96–106)
Creatinine, Ser: 1.76 mg/dL — ABNORMAL HIGH (ref 0.76–1.27)
Glucose: 101 mg/dL — ABNORMAL HIGH (ref 70–99)
Potassium: 4.4 mmol/L (ref 3.5–5.2)
Sodium: 139 mmol/L (ref 134–144)
eGFR: 41 mL/min/{1.73_m2} — ABNORMAL LOW (ref >59)

## 2022-12-14 NOTE — Telephone Encounter (Signed)
Spoke to patient's wife who called stating was started on Megestrol Acetate 40 mg once a day by Dr Letitia Neri office.  She wants to know if this could be the cause if his SOB?

## 2022-12-14 NOTE — Telephone Encounter (Signed)
Pt's wife calling to give the name of a medication the pt takes. The medication Megestrol Acetate. Please advise

## 2022-12-14 NOTE — Telephone Encounter (Signed)
Spoke to patient's wife. Message relayed. No questions at this time.

## 2022-12-19 ENCOUNTER — Telehealth: Payer: Self-pay | Admitting: Internal Medicine

## 2022-12-19 DIAGNOSIS — I10 Essential (primary) hypertension: Secondary | ICD-10-CM

## 2022-12-19 DIAGNOSIS — N1831 Chronic kidney disease, stage 3a: Secondary | ICD-10-CM

## 2022-12-19 NOTE — Telephone Encounter (Signed)
Pt is returning call to nurse for lab results. Please Advise

## 2022-12-19 NOTE — Telephone Encounter (Signed)
Spoke to patient and below lab message relayed. Patient would like all calls to home number. Patient verbalized understanding and agree. Lab order placed.    Labs show creatinine is worse - would advise better hydration. He is not on any diuretics. Needs a repeat BMET prior to contrast CT in December to see if renal function will allow contrast dye.  Also, I have already messaged scheduling - he does not need separate chest and chest/abdomen/pelvis CT scans - can we combine them if not already done?   Dr. Rennis Golden

## 2022-12-20 ENCOUNTER — Telehealth: Payer: Self-pay | Admitting: Internal Medicine

## 2022-12-20 ENCOUNTER — Other Ambulatory Visit: Payer: Self-pay | Admitting: *Deleted

## 2022-12-20 DIAGNOSIS — N1831 Chronic kidney disease, stage 3a: Secondary | ICD-10-CM

## 2022-12-20 DIAGNOSIS — I10 Essential (primary) hypertension: Secondary | ICD-10-CM

## 2022-12-20 NOTE — Telephone Encounter (Signed)
Left message for PCP office re: CT testing  PCP ordered CT lung cancer screening - 12/16 Dr. Rennis Golden ordered CT chest/abd/pelvis - 12/19  Per Dr. Rennis Golden, patient does NOT need both imaging test as the CT he ordered will capture what the PCP ordered as well.   Left message asking for follow up to see if theirs can be cancelled.   Patient/wife aware of this outgoing call also.

## 2022-12-20 NOTE — Telephone Encounter (Signed)
LM for PCP office about CT tests

## 2022-12-26 NOTE — Telephone Encounter (Signed)
Spoke with patient/wife. Advised that 12/16 CT lung cancer screening has been cancelled but 12/19 CT chest/abd/pelv is still scheduled.

## 2023-01-07 ENCOUNTER — Other Ambulatory Visit: Payer: Medicare Other

## 2023-01-08 LAB — BASIC METABOLIC PANEL
BUN/Creatinine Ratio: 7 — ABNORMAL LOW (ref 10–24)
BUN: 9 mg/dL (ref 8–27)
CO2: 21 mmol/L (ref 20–29)
Calcium: 8.8 mg/dL (ref 8.6–10.2)
Chloride: 108 mmol/L — ABNORMAL HIGH (ref 96–106)
Creatinine, Ser: 1.25 mg/dL (ref 0.76–1.27)
Glucose: 103 mg/dL — ABNORMAL HIGH (ref 70–99)
Potassium: 4.5 mmol/L (ref 3.5–5.2)
Sodium: 143 mmol/L (ref 134–144)
eGFR: 62 mL/min/{1.73_m2} (ref >59)

## 2023-01-15 ENCOUNTER — Ambulatory Visit: Payer: Medicare Other | Admitting: Adult Health

## 2023-01-16 ENCOUNTER — Ambulatory Visit: Payer: Medicare Other | Admitting: Urology

## 2023-01-16 VITALS — BP 158/84 | HR 59

## 2023-01-16 DIAGNOSIS — R351 Nocturia: Secondary | ICD-10-CM | POA: Diagnosis not present

## 2023-01-16 DIAGNOSIS — N401 Enlarged prostate with lower urinary tract symptoms: Secondary | ICD-10-CM

## 2023-01-16 DIAGNOSIS — R972 Elevated prostate specific antigen [PSA]: Secondary | ICD-10-CM | POA: Diagnosis not present

## 2023-01-16 DIAGNOSIS — N138 Other obstructive and reflux uropathy: Secondary | ICD-10-CM

## 2023-01-16 LAB — URINALYSIS, ROUTINE W REFLEX MICROSCOPIC
Bilirubin, UA: NEGATIVE
Glucose, UA: NEGATIVE
Ketones, UA: NEGATIVE
Leukocytes,UA: NEGATIVE
Nitrite, UA: NEGATIVE
Protein,UA: NEGATIVE
RBC, UA: NEGATIVE
Specific Gravity, UA: 1.01 (ref 1.005–1.030)
Urobilinogen, Ur: 1 mg/dL (ref 0.2–1.0)
pH, UA: 6 (ref 5.0–7.5)

## 2023-01-16 MED ORDER — TAMSULOSIN HCL 0.4 MG PO CAPS: 0.4000 mg | ORAL_CAPSULE | Freq: Every day | ORAL | 11 refills | Status: DC

## 2023-01-16 NOTE — Progress Notes (Signed)
01/16/2023 10:47 AM   Robert Shaw [birth year] [id number]  Referring provider: Benetta Spar, MD [address] [place],  [place] [address]  Followup elevated PSA and BPH   HPI: [name] is a 72yo here for followup fro BPh and elevated PSA. IP{SS 17 QOL 3 on flomax. He stopped the medication yesterday and he notes his LUTS have worsened today. No recent PSA. Nocturia 2-3x. Urine stream strong. No dysuria.    PMH: Past Medical History:  Diagnosis Date   Bradycardia 03/18/2012   Coronary artery disease    Dyslipidemia    Family history of heart disease    History of nuclear stress test 07/13/2008   dipyridamole; normal perfusion, no ischemia, low risk    Hypertension    NSTEMI (non-ST elevated myocardial infarction) (HCC)    h/o   Tobacco abuse     Surgical History: Past Surgical History:  Procedure Laterality Date   CARDIAC CATHETERIZATION  04/2001   after high lateral MI, preserved LV function w/mild mid anterolateral hypokinesia, diffuse coronary irregularity with signficiant obstruction in LAD, OM1, superior branch of ramus (Dr. Baxter Shaw - [facility])   CARDIAC CATHETERIZATION  09/23/2001   patent stents from 06/2001 (Dr. Evlyn Shaw)   CARDIAC CATHETERIZATION  09/17/2002   no restenosis at prior intervention sites (Dr. Bishop Shaw)   CARDIAC CATHETERIZATION  09/09/2009   no significant CAD, patent stents in OM1 & LAD (Dr. Bishop Shaw)    COLONOSCOPY N/A 02/25/2017   Procedure: COLONOSCOPY;  Surgeon: Robert Bali, MD;  [facility];  Service: Endoscopy;  Laterality: N/A;  10:30 Am   COLONOSCOPY WITH PROPOFOL N/A 04/15/2020   Procedure: COLONOSCOPY WITH PROPOFOL;  Surgeon: Robert Bal, DO;  [facility];  Service: Endoscopy;  Laterality: N/A;  pm ASA II/   CORONARY ANGIOPLASTY WITH STENT PLACEMENT  06/19/2001   Cfx (3.5x40mm Zeta stent) & diagonal stenting (2.5x63mm pixel stent) w/moderate LAD disease (Dr. Bishop Shaw)    CORONARY ANGIOPLASTY WITH STENT PLACEMENT  03/17/2012   Xience DES (2.25x26mm) of mid-distal LAD (Dr. Bishop Shaw)   CORONARY STENT INTERVENTION N/A 06/26/2022   Procedure: CORONARY STENT INTERVENTION;  Surgeon: Shaw, Robert M, MD;  Location: [facility] INVASIVE CV LAB;  Service: Cardiovascular;  Laterality: N/A;   LEFT HEART CATH AND CORONARY ANGIOGRAPHY N/A 06/26/2022   Procedure: LEFT HEART CATH AND CORONARY ANGIOGRAPHY;  Surgeon: Shaw, Robert M, MD;  Location: [facility] INVASIVE CV LAB;  Service: Cardiovascular;  Laterality: N/A;   LEFT HEART CATHETERIZATION WITH CORONARY ANGIOGRAM N/A 03/17/2012   Procedure: LEFT HEART CATHETERIZATION WITH CORONARY ANGIOGRAM;  Surgeon: Robert Nose, MD;  Location: [facility] CATH LAB;  Service: Cardiovascular;  Laterality: N/A;   PERCUTANEOUS CORONARY STENT INTERVENTION (PCI-S)  03/17/2012   Procedure: PERCUTANEOUS CORONARY STENT INTERVENTION (PCI-S);  Surgeon: Robert Nose, MD;  Location: [facility] CATH LAB;  Service: Cardiovascular;;   POLYPECTOMY  02/25/2017   Procedure: POLYPECTOMY;  Surgeon: Robert Bali, MD;  [facility];  Service: Endoscopy;;  cecal x2; ascending x4;hepatic flexure;splenic flexure; descending x2;   POLYPECTOMY  04/15/2020   Procedure: POLYPECTOMY;  Surgeon: Robert Bal, DO;  [facility];  Service: Endoscopy;;  cecal    TRANSTHORACIC ECHOCARDIOGRAM  03/29/2006   EF normal; mild MR & TR; AV mildly sclerotic    Home Medications:  Allergies as of 01/16/2023   No Known Allergies      Medication List  Accurate as of January 16, 2023 10:47 AM. If you have any questions, ask your nurse or doctor.          acetaminophen 500 MG tablet Commonly known as: TYLENOL Take 500 mg by mouth every 6 (six) hours as needed for moderate pain.   albuterol 108 (90 Base) MCG/ACT inhaler Commonly known as: VENTOLIN HFA Inhale 2 puffs into the lungs every 4 (four) hours as needed for wheezing or shortness of breath.    amLODipine 5 MG tablet Commonly known as: NORVASC Take 5 mg by mouth daily.   aspirin EC 81 MG tablet Take 81 mg by mouth daily.   clopidogrel 75 MG tablet Commonly known as: PLAVIX Take 1 tablet (75 mg total) by mouth daily.   gabapentin 300 MG capsule Commonly known as: NEURONTIN Take 300 mg by mouth 3 (three) times daily as needed (pain).   loratadine 10 MG tablet Commonly known as: CLARITIN Take 10 mg by mouth daily.   metoprolol tartrate 25 MG tablet Commonly known as: LOPRESSOR Take 0.5 tablets (12.5 mg total) by mouth 2 (two) times daily.   nitroGLYCERIN 0.4 MG SL tablet Commonly known as: NITROSTAT Place 1 tablet (0.4 mg total) under the tongue every 5 (five) minutes as needed for chest pain (up to 3 doses).   rosuvastatin 20 MG tablet Commonly known as: CRESTOR Take 1 tablet (20 mg total) by mouth daily.   sulfamethoxazole-trimethoprim 800-160 MG tablet Commonly known as: BACTRIM DS Take 1 tablet by mouth every 12 (twelve) hours.   tamsulosin 0.4 MG Caps capsule Commonly known as: FLOMAX Take 1 capsule (0.4 mg total) by mouth daily after supper.        Allergies: No Known Allergies  Family History: Family History  Problem Relation Age of Onset   Heart disease Brother        x3   Stroke Father     Social History:  reports that he has been smoking cigarettes. He has a 4 pack-year smoking history. He has never used smokeless tobacco. He reports that he does not drink alcohol and does not use drugs.  ROS: All other review of systems were reviewed and are negative except what is noted above in HPI  Physical Exam: BP (!) 158/84   Pulse (!) 59   Constitutional:  Alert and oriented, No acute distress. HEENT: [place] AT, moist mucus membranes.  Trachea midline, no masses. Cardiovascular: No clubbing, cyanosis, or edema. Respiratory: Normal respiratory effort, no increased work of breathing. GI: Abdomen is soft, nontender, nondistended, no abdominal  masses GU: No CVA tenderness.  Lymph: No cervical or inguinal lymphadenopathy. Skin: No rashes, bruises or suspicious lesions. Neurologic: Grossly intact, no focal deficits, moving all 4 extremities. Psychiatric: Normal mood and affect.  Laboratory Data: Lab Results  Component Value Date   WBC 5.6 09/11/2022   HGB 11.8 (L) 09/11/2022   HCT 35.4 (L) 09/11/2022   MCV 89 09/11/2022   PLT 219 09/11/2022    Lab Results  Component Value Date   CREATININE 1.25 01/07/2023    No results found for: "PSA"  No results found for: "TESTOSTERONE"  Lab Results  Component Value Date   HGBA1C 5.9 (H) 06/26/2022    Urinalysis    Component Value Date/Time   COLORURINE YELLOW 06/17/2007 1054   APPEARANCEUR Cloudy (A) 11/23/2022 1045   LABSPEC 1.010 06/17/2007 1054   PHURINE 7.0 06/17/2007 1054   GLUCOSEU Negative 11/23/2022 1045   HGBUR NEGATIVE 06/17/2007 1054   BILIRUBINUR Negative  11/23/2022 1045   KETONESUR NEGATIVE 06/17/2007 1054   PROTEINUR 1+ (A) 11/23/2022 1045   PROTEINUR NEGATIVE 06/17/2007 1054   UROBILINOGEN 0.2 06/17/2007 1054   NITRITE Positive (A) 11/23/2022 1045   NITRITE NEGATIVE 06/17/2007 1054   LEUKOCYTESUR 2+ (A) 11/23/2022 1045    Lab Results  Component Value Date   LABMICR See below: 11/23/2022   WBCUA >30 (A) 11/23/2022   LABEPIT 0-10 11/23/2022   BACTERIA Moderate (A) 11/23/2022    Pertinent Imaging:  No results found for this or any previous visit.  No results found for this or any previous visit.  No results found for this or any previous visit.  No results found for this or any previous visit.  No results found for this or any previous visit.  No valid procedures specified. No results found for this or any previous visit.  No results found for this or any previous visit.   Assessment & Plan:    1. Elevated PSA -PSA today - Urinalysis, Routine w reflex microscopic  2. Benign prostatic hyperplasia with urinary  obstruction Restart flomax 0.4mg  daily  3. Nocturia Flomax 0.4mg  daily   No follow-ups on file.  Wilkie Aye, MD  [facility] [place]

## 2023-01-17 LAB — PSA: Prostate Specific Ag, Serum: 18.8 ng/mL — ABNORMAL HIGH (ref 0.0–4.0)

## 2023-01-22 ENCOUNTER — Encounter: Payer: Self-pay | Admitting: Urology

## 2023-01-22 ENCOUNTER — Other Ambulatory Visit: Payer: Self-pay

## 2023-01-22 DIAGNOSIS — R972 Elevated prostate specific antigen [PSA]: Secondary | ICD-10-CM

## 2023-01-22 NOTE — Progress Notes (Signed)
Patient called and made aware of the need for the prostate biopsy. Biopsy instructions went over with patient via phone and sent vial mail.  Orders placed and f/u scheduled.

## 2023-01-22 NOTE — Telephone Encounter (Signed)
-----   Message from Wilkie Aye sent at 01/22/2023 10:18 AM EST ----- PSA remains high. He needs to proceed with biopsy ----- Message ----- From: Interface, [facility] Lab Results In Sent: 01/16/2023   3:36 PM EST To: Malen Gauze, MD

## 2023-01-22 NOTE — Patient Instructions (Signed)

## 2023-01-22 NOTE — Telephone Encounter (Signed)
Patient called and made aware of the need for the prostate biopsy. Biopsy instructions went over with patient via phone and sent vial mail.  Orders placed and f/u scheduled.

## 2023-01-23 ENCOUNTER — Telehealth: Payer: Self-pay

## 2023-01-23 NOTE — Telephone Encounter (Signed)
...     Pre-operative Risk Assessment    Patient Name: Robert Shaw  DOB: 11/15/1950 MRN: [id number]      Request for Surgical Clearance    Procedure:   PROSTATE BIOPSY  Date of Surgery:  Clearance 03/06/23                                 Surgeon:   DR Wilkie Aye Surgeon's Group or Practice Name: [facility]  Phone number:  [phone] Fax number:  [phone]   Type of Clearance Requested:   - Medical  - Pharmacy:  Hold Aspirin and Clopidogrel (Plavix)     Type of Anesthesia:  Local    Additional requests/questions:   LAST O/V 07/06/22, NEXT APPT 02/01/23  Signed, Renee Ramus   01/23/2023, 9:07 AM

## 2023-01-23 NOTE — Telephone Encounter (Signed)
   Name: DEMAR FRITCHIE  DOB: October 19, 1950  MRN: [id number]  Primary Cardiologist: Chrystie Nose, MD  Chart reviewed as part of pre-operative protocol coverage. The patient has an upcoming visit scheduled with Dr. Lyman Bishop on 02/01/2023 at which time clearance can be addressed in case there are any issues that would impact surgical recommendations.  I will route this message as FYI to requesting party and remove this message from the preop box as separate preop APP input not needed at this time.   Please call with any questions.  Napoleon Form, Leodis Rains, NP  01/23/2023, 9:12 AM

## 2023-01-25 ENCOUNTER — Other Ambulatory Visit: Payer: Self-pay

## 2023-01-25 MED ORDER — AMOXICILLIN-POT CLAVULANATE 875-125 MG PO TABS: 1.0000 | ORAL_TABLET | Freq: Every day | ORAL | 0 refills | Status: DC

## 2023-01-25 NOTE — Progress Notes (Signed)
Cardiology Office Note:  .   Date:  02/01/2023  ID:  Robert Shaw, DOB [birth year], MRN [id number] PCP: Robert Spar, MD  [facility] Providers Cardiologist:Robert Alona Bene, MD }  History of Present Illness: .   Robert Shaw is a 72 y.o. male with history of coronary artery disease status post PCI to the mid and distal LAD with a Xience drug-eluting stent in 2014, with repeat PCI to the distal LAD on 06/2022, hypertension, dyslipidemia, ongoing tobacco abuse, with EKG revealing anterior lateral and inferior T wave abnormalities which are unchanged.  Was last seen by Dr. Rennis Shaw on 12/13/2022 and he was complaining of unintentional weight loss and anorexia.  He was advised to follow-up with PCP as this was not found to be cardiac in etiology  He is here today for preoperative evaluation in order to have a prostate biopsy on 03/06/2023 by Dr. Wilkie Aye with [facility] urology.  He is currently on Plavix 75 mg daily and aspirin 81 mg daily.    He is here today with questions about what the procedure involves that he is having his preoperative evaluation for.  He was unaware that he was having a prostate biopsy.  The patient denies any significant chest pain, he does have chronic dyspnea on exertion which has been stable.  He states he just has to stop when he is doing something exertional to catch his breath sometimes.  This is not worsened.  He is medically compliant.  Denies any bleeding bruising on the DAPT.  ROS: As above otherwise negative  Studies Reviewed: Marland Kitchen   LHC 06/26/2022  Dist LAD lesion is 90% stenosed.   Lat 1st Diag lesion is 90% stenosed.   Prox RCA to Mid RCA lesion is 60% stenosed.   Previously placed Mid LAD to Dist LAD stent of unknown type is  widely patent.   Previously placed 1st Diag stent of unknown type is  widely patent.   Previously placed Prox Cx to Mid Cx stent of unknown type is  widely patent.   A drug-eluting stent was  successfully placed using a SYNERGY XD 2.25X12.   Post intervention, there is a 0% residual stenosis.   Post intervention, there is a 0% residual stenosis.   The left ventricular systolic function is normal.   LV end diastolic pressure is mildly elevated.   The left ventricular ejection fraction is 55-65% by visual estimate.   Continued patency of stents in the mid LAD, first diagonal, LCx. New culprit lesion in the distal LAD at distal stent margin. Sub branch of the fist diagonal is too small for PCI. The larger diagonal branch is patent. Overall good LV function with inferoapical wall motion abnormality from wrap around LAD Mildly elevated LVEDP 17 mm Hg Successful PCI of the distal LAD with DES x 1 overlapping with old stent  EKG Interpretation Date/Time:  Friday February 01 2023 09:27:14 EST Ventricular Rate:  57 PR Interval:  152 QRS Duration:  100 QT Interval:  430 QTC Calculation: 418 R Axis:   13  Text Interpretation: Sinus bradycardia Possible Left atrial enlargement T wave abnormality, consider lateral ischemia When compared with ECG of 13-Dec-2022 14:04, Nonspecific T wave abnormality has replaced inverted T waves in Inferior leads Confirmed by Robert Shaw [id number]) on 02/01/2023 9:43:12 AM      Physical Exam:   VS:  BP 128/72   Pulse (!) 57   Ht 6' (1.829 m)   Wt 148 lb  9.6 oz (67.4 kg)   SpO2 99%   BMI 20.15 kg/m    Wt Readings from Last 3 Encounters:  02/01/23 148 lb 9.6 oz (67.4 kg)  12/13/22 136 lb 3.2 oz (61.8 kg)  07/06/22 137 lb 12.8 oz (62.5 kg)    GEN: Well nourished, well developed in no acute distress NECK: No JVD; No carotid bruits CARDIAC: RRR, bradycardic no murmurs, rubs, gallops RESPIRATORY:  Clear to auscultation with occasional upper airway congested cough, without rales, wheezing or rhonchi  ABDOMEN: Soft, non-tender, non-distended EXTREMITIES:  No edema; No deformity   ASSESSMENT AND PLAN: .   1.Pre-Op Evaluation:  According to the  Revised Cardiac Risk Index (RCRI), his Perioperative Risk of Major Cardiac Event is (%): 0.4  His Functional Capacity in METs is: 7.25 according to the Duke Activity Status Index (DASI).  Activity is limited by some generalized fatigue.  Per office protocol, if patient is without any new symptoms or concerns at the time of their virtual visit, he/she may hold Plavix and ASA for 5 days prior to procedure. Please resume Plalvix and ASA as soon as possible postprocedure, at the discretion of the surgeon.    Therefore, based on ACC/AHA guidelines, patient would be at acceptable risk for the planned procedure without further cardiovascular testing. I will route this recommendation to the requesting party via Epic fax function.   2.  Coronary artery disease: He denies any chest pain he has chronic shortness of breath which he believes is related to deconditioning.  He denies any profound fatigue.  Continue secondary prevention with lipid management, blood pressure control, and purposeful exercise.  3.  Chronic dyspnea on exertion: Patient has ongoing tobacco abuse and therefore this may be related to lung disease.  Should follow-up with PCP for further evaluation.  4.  Hypercholesterolemia: With coronary artery disease LDL should be less than 70.  He remains on rosuvastatin 20 mg daily.  Most recent labs reviewed around 08/03/2022 with total cholesterol 119, LDL 51 HDL 52.      Signed, Bettey Mare. Robert Shaw, ANP, AACC

## 2023-01-28 ENCOUNTER — Encounter (HOSPITAL_COMMUNITY): Payer: Self-pay

## 2023-01-28 ENCOUNTER — Ambulatory Visit (HOSPITAL_COMMUNITY): Payer: Medicare Other

## 2023-01-31 ENCOUNTER — Ambulatory Visit (HOSPITAL_COMMUNITY)
Admission: RE | Admit: 2023-01-31 | Discharge: 2023-01-31 | Disposition: A | Discharge status: Home / Self Care | Payer: Medicare Other | Source: Ambulatory Visit | Attending: Internal Medicine | Admitting: Internal Medicine

## 2023-01-31 DIAGNOSIS — J439 Emphysema, unspecified: Secondary | ICD-10-CM | POA: Diagnosis not present

## 2023-01-31 DIAGNOSIS — N400 Enlarged prostate without lower urinary tract symptoms: Secondary | ICD-10-CM | POA: Diagnosis not present

## 2023-01-31 DIAGNOSIS — I700 Atherosclerosis of aorta: Secondary | ICD-10-CM | POA: Diagnosis not present

## 2023-01-31 DIAGNOSIS — R634 Abnormal weight loss: Secondary | ICD-10-CM | POA: Insufficient documentation

## 2023-01-31 DIAGNOSIS — K5730 Diverticulosis of large intestine without perforation or abscess without bleeding: Secondary | ICD-10-CM | POA: Diagnosis not present

## 2023-01-31 DIAGNOSIS — R630 Anorexia: Secondary | ICD-10-CM | POA: Diagnosis present

## 2023-01-31 MED ORDER — IOHEXOL 300 MG/ML  SOLN
100.0000 mL | Freq: Once | INTRAMUSCULAR | Status: AC | PRN
  Administered 2023-01-31: 100 mL via INTRAVENOUS

## 2023-02-01 ENCOUNTER — Encounter: Payer: Self-pay | Admitting: Adult Health

## 2023-02-01 ENCOUNTER — Ambulatory Visit: Payer: Medicare Other | Attending: Adult Health | Admitting: Adult Health

## 2023-02-01 VITALS — BP 128/72 | HR 57 | Ht 72.0 in | Wt 148.6 lb

## 2023-02-01 DIAGNOSIS — F17200 Nicotine dependence, unspecified, uncomplicated: Secondary | ICD-10-CM

## 2023-02-01 DIAGNOSIS — I43 Cardiomyopathy in diseases classified elsewhere: Secondary | ICD-10-CM

## 2023-02-01 DIAGNOSIS — Z01818 Encounter for other preprocedural examination: Secondary | ICD-10-CM | POA: Diagnosis not present

## 2023-02-01 DIAGNOSIS — R0609 Other forms of dyspnea: Secondary | ICD-10-CM

## 2023-02-01 DIAGNOSIS — Z01810 Encounter for preprocedural cardiovascular examination: Secondary | ICD-10-CM | POA: Diagnosis not present

## 2023-02-01 DIAGNOSIS — E7800 Pure hypercholesterolemia, unspecified: Secondary | ICD-10-CM

## 2023-02-01 NOTE — Patient Instructions (Signed)
Medication Instructions:  No changes *If you need a refill on your cardiac medications before your next appointment, please call your pharmacy*   Lab Work: No Labs If you have labs (blood work) drawn today and your tests are completely normal, you will receive your results only by: MyChart Message (if you have MyChart) OR A paper copy in the mail If you have any lab test that is abnormal or we need to change your treatment, we will call you to review the results.   Testing/Procedures: No Testing   Follow-Up: At [facility], you and your health needs are our priority.  As part of our continuing mission to provide you with exceptional heart care, we have created designated Provider Care Teams.  These Care Teams include your primary Cardiologist (physician) and Advanced Practice Providers (APPs -  Physician Assistants and Nurse Practitioners) who all work together to provide you with the care you need, when you need it.  We recommend signing up for the patient portal called "MyChart".  Sign up information is provided on this After Visit Summary.  MyChart is used to connect with patients for Virtual Visits (Telemedicine).  Patients are able to view lab/test results, encounter notes, upcoming appointments, etc.  Non-urgent messages can be sent to your provider as well.   To learn more about what you can do with MyChart, go to [email or web address].    Your next appointment:   1 month(s)  Provider:   Chrystie Nose, MD

## 2023-02-11 ENCOUNTER — Encounter: Payer: Self-pay | Admitting: Internal Medicine

## 2023-02-14 ENCOUNTER — Telehealth: Payer: Self-pay

## 2023-02-14 DIAGNOSIS — H40023 Open angle with borderline findings, high risk, bilateral: Secondary | ICD-10-CM | POA: Diagnosis not present

## 2023-02-14 NOTE — Telephone Encounter (Signed)
 I called patient to discuss prostate biopsy instructions regarding clearance that was requested and approval received.  Patient did not answer. Mailbox full.

## 2023-02-14 NOTE — Telephone Encounter (Signed)
-----   Message from Wilkie Aye sent at 01/22/2023 10:18 AM EST ----- PSA remains high. He needs to proceed with biopsy ----- Message ----- From: Interface, [facility] Lab Results In Sent: 01/16/2023   3:36 PM EST To: Malen Gauze, MD

## 2023-02-14 NOTE — Telephone Encounter (Signed)
 Patient returned call to office. I spoke with wife reviewed plavix 5 days prior per cardiology okay to hold. Wife voiced understanding of patient to hold plavix as directed. Patient scheduled for prostate biopsy for 03/06/2023

## 2023-03-02 DIAGNOSIS — I10 Essential (primary) hypertension: Secondary | ICD-10-CM | POA: Diagnosis not present

## 2023-03-02 DIAGNOSIS — I2510 Atherosclerotic heart disease of native coronary artery without angina pectoris: Secondary | ICD-10-CM | POA: Diagnosis not present

## 2023-03-06 ENCOUNTER — Encounter (HOSPITAL_COMMUNITY): Payer: Self-pay

## 2023-03-06 ENCOUNTER — Ambulatory Visit: Payer: Medicare Other | Admitting: Urology

## 2023-03-06 ENCOUNTER — Encounter: Payer: Self-pay | Admitting: Urology

## 2023-03-06 ENCOUNTER — Ambulatory Visit (HOSPITAL_COMMUNITY)
Admission: RE | Admit: 2023-03-06 | Discharge: 2023-03-06 | Disposition: A | Discharge status: Home / Self Care | Payer: Medicare Other | Source: Ambulatory Visit | Attending: Urology | Admitting: Urology

## 2023-03-06 ENCOUNTER — Other Ambulatory Visit: Payer: Self-pay | Admitting: Urology

## 2023-03-06 VITALS — BP 122/61 | HR 54 | Temp 98.0°F | Resp 18

## 2023-03-06 DIAGNOSIS — C61 Malignant neoplasm of prostate: Secondary | ICD-10-CM | POA: Diagnosis not present

## 2023-03-06 DIAGNOSIS — R972 Elevated prostate specific antigen [PSA]: Secondary | ICD-10-CM

## 2023-03-06 MED ORDER — LIDOCAINE HCL (PF) 2 % IJ SOLN
INTRAMUSCULAR | Status: AC
  Filled 2023-03-06: qty 10

## 2023-03-06 MED ORDER — GENTAMICIN SULFATE 40 MG/ML IJ SOLN
INTRAMUSCULAR | Status: AC
  Filled 2023-03-06: qty 2

## 2023-03-06 MED ORDER — GENTAMICIN SULFATE 40 MG/ML IJ SOLN
80.0000 mg | Freq: Once | INTRAMUSCULAR | Status: AC
  Administered 2023-03-06: 80 mg via INTRAMUSCULAR

## 2023-03-06 MED ORDER — LIDOCAINE HCL (PF) 2 % IJ SOLN
10.0000 mL | Freq: Once | INTRAMUSCULAR | Status: AC
  Administered 2023-03-06: 10 mL

## 2023-03-06 NOTE — Progress Notes (Signed)
Prostate Biopsy Procedure   Informed consent was obtained after discussing risks/benefits of the procedure.  A time out was performed to ensure correct patient identity.  Pre-Procedure: - Last PSA Level: No results found for: "PSA" - Gentamicin given prophylactically - Levaquin 500 mg administered PO -Transrectal Ultrasound performed revealing a 96.3 gm prostate -No significant hypoechoic or median lobe noted  Procedure: - Prostate block performed using 10 cc 1% lidocaine and biopsies taken from sextant areas, a total of 12 under ultrasound guidance.  Post-Procedure: - Patient tolerated the procedure well - He was counseled to seek immediate medical attention if experiences any severe pain, significant bleeding, or fevers - Return in one week to discuss biopsy results

## 2023-03-06 NOTE — Patient Instructions (Signed)
Transrectal Ultrasound-Guided Prostate Biopsy, Care After What can I expect after the procedure? After the procedure, it is common to have: Pain and discomfort near your butt (rectum), especially while sitting. Pink-colored pee (urine). This is due to small amounts of blood in your pee. A burning feeling while peeing. Blood in your poop (stool). Bleeding from your butt. Blood in your semen. Follow these instructions at home: Medicines Take over-the-counter and prescription medicines only as told by your doctor. If you were given a sedative during your procedure, do not drive or use machines until your doctor says that it is safe. A sedative is a medicine that helps you relax. If you were prescribed an antibiotic medicine, take it as told by your doctor. Do not stop taking it even if you start to feel better. Activity  Return to your normal activities when your doctor says that it is safe. Ask your doctor when it is okay for you to have sex. You may have to avoid lifting. Ask your doctor how much you can safely lift. General instructions  Drink enough water to keep your pee pale yellow. Watch your pee, poop, and semen for new bleeding or bleeding that gets worse. Keep all follow-up visits. Contact a doctor if: You have any of these: Blood clots in your pee or poop. Blood in your pee more than 2 weeks after the procedure. Blood in your semen more than 2 months after the procedure. New or worse bleeding in your pee, poop, or semen. Very bad belly pain. Your pee smells bad or unusual. You have trouble peeing. Your lower belly feels firm. You have problems getting an erection. You feel like you may vomit (are nauseous), or you vomit. Get help right away if: You have a fever or chills. You have bright red pee. You have very bad pain that does not get better with medicine. You cannot pee. Summary After this procedure, it is common to have pain and discomfort near your butt,  especially while sitting. You may have blood in your pee and poop. It is common to have blood in your semen. Get help right away if you have a fever or chills. This information is not intended to replace advice given to you by your health care provider. Make sure you discuss any questions you have with your health care provider. Document Revised: 07/25/2020 Document Reviewed: 07/25/2020 Elsevier Patient Education  2024 [employer].

## 2023-03-06 NOTE — Progress Notes (Signed)
PT tolerated prostate biopsy procedure and antibiotic injection well today. Labs obtained and sent for pathology by Lexi from ultrasound. PT ambulatory at discharge with no acute distress noted and verbalized understanding of discharge instructions. PT to follow up with urologist as scheduled on 03/15/23 @1250 .

## 2023-03-08 LAB — SURGICAL PATHOLOGY

## 2023-03-15 ENCOUNTER — Ambulatory Visit: Payer: Medicare Other | Admitting: Urology

## 2023-03-15 VITALS — BP 117/66 | HR 68

## 2023-03-15 DIAGNOSIS — R972 Elevated prostate specific antigen [PSA]: Secondary | ICD-10-CM

## 2023-03-15 DIAGNOSIS — C61 Malignant neoplasm of prostate: Secondary | ICD-10-CM

## 2023-03-15 NOTE — Progress Notes (Unsigned)
03/15/2023 1:41 PM   Robert Shaw [birth year] [id number]  Referring provider: Benetta Spar, MD [address] [place],  [place] [address]     HPI: Mr Turnley is a 72yo here for followup after prostate biopsy. 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 PSA 18.8. Prostate volume 96cc. He denies any worsening LUTS. NO other complaints today   PMH: Past Medical History:  Diagnosis Date   Bradycardia 03/18/2012   Coronary artery disease    Dyslipidemia    Family history of heart disease    History of nuclear stress test 07/13/2008   dipyridamole; normal perfusion, no ischemia, low risk    Hypertension    NSTEMI (non-ST elevated myocardial infarction) (HCC)    h/o   Tobacco abuse     Surgical History: Past Surgical History:  Procedure Laterality Date   CARDIAC CATHETERIZATION  04/2001   after high lateral MI, preserved LV function w/mild mid anterolateral hypokinesia, diffuse coronary irregularity with signficiant obstruction in LAD, OM1, superior branch of ramus (Dr. Baxter Hire - [facility])   CARDIAC CATHETERIZATION  09/23/2001   patent stents from 06/2001 (Dr. Evlyn Courier)   CARDIAC CATHETERIZATION  09/17/2002   no restenosis at prior intervention sites (Dr. Bishop Limbo)   CARDIAC CATHETERIZATION  09/09/2009   no significant CAD, patent stents in OM1 & LAD (Dr. Bishop Limbo)    COLONOSCOPY N/A 02/25/2017   Procedure: COLONOSCOPY;  Surgeon: West Bali, MD;  [facility];  Service: Endoscopy;  Laterality: N/A;  10:30 Am   COLONOSCOPY WITH PROPOFOL N/A 04/15/2020   Procedure: COLONOSCOPY WITH PROPOFOL;  Surgeon: Lanelle Bal, DO;  [facility];  Service: Endoscopy;  Laterality: N/A;  pm ASA II/   CORONARY ANGIOPLASTY WITH STENT PLACEMENT  06/19/2001   Cfx (3.5x33mm Zeta stent) & diagonal stenting (2.5x45mm pixel stent) w/moderate LAD disease (Dr. Bishop Limbo)    CORONARY ANGIOPLASTY WITH STENT PLACEMENT  03/17/2012    Xience DES (2.25x35mm) of mid-distal LAD (Dr. Bishop Limbo)   CORONARY STENT INTERVENTION N/A 06/26/2022   Procedure: CORONARY STENT INTERVENTION;  Surgeon: Swaziland, Peter M, MD;  Location: [facility] INVASIVE CV LAB;  Service: Cardiovascular;  Laterality: N/A;   LEFT HEART CATH AND CORONARY ANGIOGRAPHY N/A 06/26/2022   Procedure: LEFT HEART CATH AND CORONARY ANGIOGRAPHY;  Surgeon: Swaziland, Peter M, MD;  Location: [facility] INVASIVE CV LAB;  Service: Cardiovascular;  Laterality: N/A;   LEFT HEART CATHETERIZATION WITH CORONARY ANGIOGRAM N/A 03/17/2012   Procedure: LEFT HEART CATHETERIZATION WITH CORONARY ANGIOGRAM;  Surgeon: Chrystie Nose, MD;  Location: [facility] CATH LAB;  Service: Cardiovascular;  Laterality: N/A;   PERCUTANEOUS CORONARY STENT INTERVENTION (PCI-S)  03/17/2012   Procedure: PERCUTANEOUS CORONARY STENT INTERVENTION (PCI-S);  Surgeon: Chrystie Nose, MD;  Location: [facility] CATH LAB;  Service: Cardiovascular;;   POLYPECTOMY  02/25/2017   Procedure: POLYPECTOMY;  Surgeon: West Bali, MD;  [facility];  Service: Endoscopy;;  cecal x2; ascending x4;hepatic flexure;splenic flexure; descending x2;   POLYPECTOMY  04/15/2020   Procedure: POLYPECTOMY;  Surgeon: Lanelle Bal, DO;  [facility];  Service: Endoscopy;;  cecal    TRANSTHORACIC ECHOCARDIOGRAM  03/29/2006   EF normal; mild MR & TR; AV mildly sclerotic    Home Medications:  Allergies as of 03/15/2023   No Known Allergies      Medication List        Accurate as of March 15, 2023  1:41 PM. If  you have any questions, ask your nurse or doctor.          acetaminophen 500 MG tablet Commonly known as: TYLENOL Take 500 mg by mouth every 6 (six) hours as needed for moderate pain.   albuterol 108 (90 Base) MCG/ACT inhaler Commonly known as: VENTOLIN HFA Inhale 2 puffs into the lungs every 4 (four) hours as needed for wheezing or shortness of breath.   amLODipine 5 MG tablet Commonly known as: NORVASC Take 5 mg by mouth  daily.   amoxicillin-clavulanate 875-125 MG tablet Commonly known as: AUGMENTIN Take 1 tablet by mouth daily. Take the morning of procedure on 03/06/23   aspirin EC 81 MG tablet Take 81 mg by mouth daily.   clopidogrel 75 MG tablet Commonly known as: PLAVIX Take 1 tablet (75 mg total) by mouth daily.   gabapentin 300 MG capsule Commonly known as: NEURONTIN Take 300 mg by mouth 3 (three) times daily as needed (pain).   loratadine 10 MG tablet Commonly known as: CLARITIN Take 10 mg by mouth daily.   metoprolol tartrate 25 MG tablet Commonly known as: LOPRESSOR Take 0.5 tablets (12.5 mg total) by mouth 2 (two) times daily.   nitroGLYCERIN 0.4 MG SL tablet Commonly known as: NITROSTAT Place 1 tablet (0.4 mg total) under the tongue every 5 (five) minutes as needed for chest pain (up to 3 doses).   rosuvastatin 20 MG tablet Commonly known as: CRESTOR Take 1 tablet (20 mg total) by mouth daily.   tamsulosin 0.4 MG Caps capsule Commonly known as: FLOMAX Take 1 capsule (0.4 mg total) by mouth daily after supper.        Allergies: No Known Allergies  Family History: Family History  Problem Relation Age of Onset   Heart disease Brother        x3   Stroke Father     Social History:  reports that he has been smoking cigarettes. He has a 4 pack-year smoking history. He has never used smokeless tobacco. He reports that he does not drink alcohol and does not use drugs.  ROS: All other review of systems were reviewed and are negative except what is noted above in HPI  Physical Exam: BP 117/66   Pulse 68   Constitutional:  Alert and oriented, No acute distress. HEENT: [place] AT, moist mucus membranes.  Trachea midline, no masses. Cardiovascular: No clubbing, cyanosis, or edema. Respiratory: Normal respiratory effort, no increased work of breathing. GI: Abdomen is soft, nontender, nondistended, no abdominal masses GU: No CVA tenderness.  Lymph: No cervical or inguinal  lymphadenopathy. Skin: No rashes, bruises or suspicious lesions. Neurologic: Grossly intact, no focal deficits, moving all 4 extremities. Psychiatric: Normal mood and affect.  Laboratory Data: Lab Results  Component Value Date   WBC 5.6 09/11/2022   HGB 11.8 (L) 09/11/2022   HCT 35.4 (L) 09/11/2022   MCV 89 09/11/2022   PLT 219 09/11/2022    Lab Results  Component Value Date   CREATININE 1.25 01/07/2023    No results found for: "PSA"  No results found for: "TESTOSTERONE"  Lab Results  Component Value Date   HGBA1C 5.9 (H) 06/26/2022    Urinalysis    Component Value Date/Time   COLORURINE YELLOW 06/17/2007 1054   APPEARANCEUR Clear 01/16/2023 1029   LABSPEC 1.010 06/17/2007 1054   PHURINE 7.0 06/17/2007 1054   GLUCOSEU Negative 01/16/2023 1029   HGBUR NEGATIVE 06/17/2007 1054   BILIRUBINUR Negative 01/16/2023 1029   KETONESUR NEGATIVE 06/17/2007 1054  PROTEINUR Negative 01/16/2023 1029   PROTEINUR NEGATIVE 06/17/2007 1054   UROBILINOGEN 0.2 06/17/2007 1054   NITRITE Negative 01/16/2023 1029   NITRITE NEGATIVE 06/17/2007 1054   LEUKOCYTESUR Negative 01/16/2023 1029    Lab Results  Component Value Date   LABMICR Comment 01/16/2023   WBCUA >30 (A) 11/23/2022   LABEPIT 0-10 11/23/2022   BACTERIA Moderate (A) 11/23/2022    Pertinent Imaging:  No results found for this or any previous visit.  No results found for this or any previous visit.  No results found for this or any previous visit.  No results found for this or any previous visit.  No results found for this or any previous visit.  No results found for this or any previous visit.  No results found for this or any previous visit.  No results found for this or any previous visit.   Assessment & Plan:    1.  Prostate cancer [facility]) I discussed the natural history of low risk prostate cancer with the patient and the various treatment options including active surveillance, RALP, IMRT,  brachytherapy, cryotherapy, HIFU and ADT. After discussing the options the patient elects for active surveillance  - MR PROSTATE W WO CONTRAST; Future   No follow-ups on file.  Wilkie Aye, MD  [facility] [place]

## 2023-03-19 ENCOUNTER — Encounter: Payer: Self-pay | Admitting: Urology

## 2023-03-19 NOTE — Patient Instructions (Signed)

## 2023-03-29 ENCOUNTER — Encounter: Payer: Self-pay | Admitting: Internal Medicine

## 2023-03-29 ENCOUNTER — Ambulatory Visit: Payer: Medicare Other | Attending: Internal Medicine | Admitting: Internal Medicine

## 2023-03-29 VITALS — BP 120/70 | HR 49 | Ht 72.0 in | Wt 148.6 lb

## 2023-03-29 DIAGNOSIS — I10 Essential (primary) hypertension: Secondary | ICD-10-CM

## 2023-03-29 DIAGNOSIS — Z716 Tobacco abuse counseling: Secondary | ICD-10-CM

## 2023-03-29 DIAGNOSIS — I2510 Atherosclerotic heart disease of native coronary artery without angina pectoris: Secondary | ICD-10-CM

## 2023-03-29 DIAGNOSIS — E7849 Other hyperlipidemia: Secondary | ICD-10-CM | POA: Diagnosis not present

## 2023-03-29 DIAGNOSIS — C61 Malignant neoplasm of prostate: Secondary | ICD-10-CM

## 2023-03-29 MED ORDER — BUPROPION HCL ER (SR) 150 MG PO TB12: 150.0000 mg | ORAL_TABLET | Freq: Two times a day (BID) | ORAL | 0 refills | Status: DC

## 2023-03-29 NOTE — Patient Instructions (Addendum)
Medication Instructions:  Stop:  Metoprolol tartrate (Lopressor) (your heart rate is low, and this med can interact with the Wellbutrin)  Stop: Clopidogrel (Plavix) in May 2025  (in about 3 months)  Start: Bupropion (Wellbutrin SR) 150 mg once daily for 3 days, after this take 150 mg twice daily for 8 weeks (for Smoking Cessation)  Lab Work: None  Testing/Procedures: None   Follow-Up: At [facility], you and your health needs are our priority.  As part of our continuing mission to provide you with exceptional heart care, we have created designated Provider Care Teams.  These Care Teams include your primary Cardiologist (physician) and Advanced Practice Providers (APPs -  Physician Assistants and Nurse Practitioners) who all work together to provide you with the care you need, when you need it.  We recommend signing up for the patient portal called "MyChart".  Sign up information is provided on this After Visit Summary.  MyChart is used to connect with patients for Virtual Visits (Telemedicine).  Patients are able to view lab/test results, encounter notes, upcoming appointments, etc.  Non-urgent messages can be sent to your provider as well.   To learn more about what you can do with MyChart, go to [email or web address].    Your next appointment:   6 month(s)  Provider:   Chrystie Nose, MD    Other Instructions  [employer] contact info: [phone] To reach [employer]: Visit [email or web address]. Text READY to [phone]. Call 1-800-Quit-Now [phone]) Available 24/7

## 2023-03-29 NOTE — Progress Notes (Signed)
OFFICE NOTE  Chief Complaint:  Follow-up decreased appetite, weight loss  Primary Care Physician: Benetta Spar, MD  HPI:  Robert Shaw is a 73 year old gentleman whom I have been following for a history of coronary disease. He had a stent in his circumflex and diagonal in 2003 and had moderate disease in the LAD and the right coronary. He continues to smoke, which I have talked to him about as a marked risk factor, and he has been working on some exercise. Unfortunately, on January 31 he had an episode when he developed chest pain, somewhat after eating with some shortness of breath, that went to both arms. He took some aspirin at home and the pain did not go away. He, therefore, presented to the emergency department. He was found to have an elevated troponin of 0.72 and underwent cardiac catheterization. That catheterization, performed by me, showed a severe discrete 95% stenosis in the mid LAD. Both the stents in the diagonal and circumflex were patent with minimal in-stent restenosis. The EF was greater than 60% with no focal wall motion abnormalities. The patient then underwent a Xience drug-eluting stent placement to the mid LAD on March 17, 2012, by Dr. Tresa Endo. This was successful at reducing the lesion to 0% stenosis.  Since his hospitalization he has done very well. He has had no further chest pain. He was switched successfully from Plavix over to Brilinta, which he is taking with low-dose aspirin. His only complaint now is that he is having some numbness and 2 toes on his right foot. He reports he had problems with his right leg after the catheterization saying that he developed a foot drop but that has progressively gotten better over the past year.  He had no hematoma, bruit or catheterization complications that we were aware of at the time. Unfortunately, he continues to smoke, which is an ongoing risk factor.  Robert Shaw returns today for followup. He is doing well denies  any chest pain or worsening shortness of breath. He continues on aspirin and Brillinta. During his last office visit we had recommended starting Chantix for smoking cessation however he has not started that medication. He continues to smoke several cigarettes a day. He has no good reason for why he did not start the medication. He is also anticipating undergoing a switched to Medicare this year. Recent laboratory work was performed by his primary care provider which showed an impaired fasting glucose at 101 and hemoglobin A1c of 6.1 suggesting he may be prediabetic. His creatinine is 1.4. Lipid profile demonstrates total cholesterol 107, HDL 37, triglycerides 95 and LDL 51.  I saw Robert Shaw back in the office today. He is currently without any complaints. He denies any chest pain or worsening shortness of breath. Unfortunately has not been able to stop smoking but he is down to about 2 cigarettes a day. As mentioned above his last PCI was in February 2014. He remains on dual antiplatelets therapy with Brilinta, this was for non-STEMI. The data with long-term therapy of Brilinta does not go out much past 2 years therefore we may consider having him come off of this medication.  09/14/2015  Robert Shaw returns today for follow-up. He is without complaints. Overall he is doing really well. Denies any shortness of breath or chest pain. He remains physically active. He had recent cholesterol testing through his primary care provider which showed LDL less than 70. He is on Crestor 10 mg daily. Blood pressure is at  goal.  10/08/2016  Robert Shaw was seen today in follow-up. This is an annual visit. Overall he is asymptomatic. He denies any chest pain or worsening shortness of breath. 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 He is a blood pressure is well-controlled today. He has not had a recent lipid check. His EKG was personally reviewed and shows stable anterolateral T-wave inversions in V3 through V6 as  well as high lateral leads of 1 and aVL. He's not had any coronary events that were aware of since 2014.  10/31/2017  Robert Shaw returns for annual visit.  Overall continues to do well.  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  Denies any chest pain or worsening shortness of breath.  He is maintained on low-dose aspirin, metoprolol and Crestor.  He is due for repeat lipid profile as his last study was a year ago.  He has not had any recent testing.  EKG personally reviewed demonstrates stable lateral T wave inversions.  10/29/2019  Robert Shaw is seen today in follow-up with his wife.  He continues to do well and is asymptomatic.  He denies chest pain or shortness of breath.  He is noted to have some bradycardia today.  He was previously on metoprolol 12-1/2 twice daily but is now been taking a full tablet at night.  Heart rates in the 40s today but he is asymptomatic.  Lipids have been well controlled with a total cholesterol 104, HDL 36, LDL 52 and triglycerides 77.  Blood pressure is at goal today as well at 120/80.  12/28/2020  Robert Shaw returns today for follow-up.  Blood pressure is excellent 128/80.  He denies any chest pain or shortness of breath.  EKG shows a normal sinus rhythm.  He still has anterolateral T wave inversions which were present previously.  He has well-controlled lipids on a statin with LDL of 59.  A1c 5.7.  Of interest his creatinine was elevated at 1.6 in July.  He says he had no knowledge of this.  He is advised to follow-up with his PCP regarding this.  12/25/2021  Robert Shaw is seen today in follow-up.  Overall he says he is feeling pretty well.  He Nuys any chest pain or shortness of breath.  Unfortunate continues to smoke.  Has not been able to cut back on this much.  He is blood pressure is reasonably well controlled today.  EKG shows a sinus bradycardia and unchanged from prior EKGs.  His lipids were in August showing total  cholesterol 122, HDL 44, triglycerides 74 and LDL 63 which is at goal.  16/11/9602  Robert Shaw is seen today in follow-up. His main concern is that he has been losing weight unintentionally. He was recently placed on Megace by his PCP for this. He has also had some exertional dyspnea. Recently he had an NSTEMI in 06/2022. Cardiac cath showed distal LAD disease and he was stented. After that he noted some symptomatic improvement, but subsequently he has continued to decline. He is frustrated that we have not identified the cause of his significant weight loss, although by the scales he is only about 1 lb less than back in May. He says his appetite is poor. He denies nausea, vomiting, diarrhea, fever or consitutional symptoms. He has a colonoscopy a few years ago which showed some polyps. He had a recent bladder infection which was treated. He is a former smoker but has had yearly  lung cancer screening CT's which have been negative.  03/29/2023  Robert Shaw returns today for follow-up.  Recently has actually gained a little bit of weight back.  He had a CT scan that I ordered of the chest abdomen and pelvis looking for any signs of significant malignancy.  There was some heterogeneous enhancement of the enlarged prostate.  This was correlated with PSA which was noted to be elevated.  He has seen Dr. Ronne Binning who did a prostate biopsy and it did confirm what sounds at the low-grade prostate cancer.  He was reassured and has follow-up for this.  I do not think this is playing a role in his weight loss however that question was not opposed to Dr. Ronne Binning.  Robert Shaw otherwise is doing well.  Denies any chest pain or shortness of breath.  He is now interested in smoking cessation.  We discussed this at some length today.  We also discussed options for medical therapy to help with this.  This could include patches or gum, Chantix or Wellbutrin.  Unconcerned about the side effects potentially with Chantix given his  age.  Wellbutrin might be an option for him due to some of its antidepressant properties however he will have to be mindful of possible weight loss.  PMHx:  Past Medical History:  Diagnosis Date   Bradycardia 03/18/2012   Coronary artery disease    Dyslipidemia    Family history of heart disease    History of nuclear stress test 07/13/2008   dipyridamole; normal perfusion, no ischemia, low risk    Hypertension    NSTEMI (non-ST elevated myocardial infarction) (HCC)    h/o   Tobacco abuse     Past Surgical History:  Procedure Laterality Date   CARDIAC CATHETERIZATION  04/2001   after high lateral MI, preserved LV function w/mild mid anterolateral hypokinesia, diffuse coronary irregularity with signficiant obstruction in LAD, OM1, superior branch of ramus (Dr. Baxter Hire - [facility])   CARDIAC CATHETERIZATION  09/23/2001   patent stents from 06/2001 (Dr. Evlyn Courier)   CARDIAC CATHETERIZATION  09/17/2002   no restenosis at prior intervention sites (Dr. Bishop Limbo)   CARDIAC CATHETERIZATION  09/09/2009   no significant CAD, patent stents in OM1 & LAD (Dr. Bishop Limbo)    COLONOSCOPY N/A 02/25/2017   Procedure: COLONOSCOPY;  Surgeon: West Bali, MD;  Location: [facility];  Service: Endoscopy;  Laterality: N/A;  10:30 Am   COLONOSCOPY WITH PROPOFOL N/A 04/15/2020   Procedure: COLONOSCOPY WITH PROPOFOL;  Surgeon: Lanelle Bal, DO;  Location: [facility];  Service: Endoscopy;  Laterality: N/A;  pm ASA II/   CORONARY ANGIOPLASTY WITH STENT PLACEMENT  06/19/2001   Cfx (3.5x77mm Zeta stent) & diagonal stenting (2.5x37mm pixel stent) w/moderate LAD disease (Dr. Bishop Limbo)    CORONARY ANGIOPLASTY WITH STENT PLACEMENT  03/17/2012   Xience DES (2.25x32mm) of mid-distal LAD (Dr. Bishop Limbo)   CORONARY STENT INTERVENTION N/A 06/26/2022   Procedure: CORONARY STENT INTERVENTION;  Surgeon: Swaziland, Peter M, MD;  Location[facility] INVASIVE CV LAB;  Service: Cardiovascular;  Laterality:  N/A;   LEFT HEART CATH AND CORONARY ANGIOGRAPHY N/A 06/26/2022   Procedure: LEFT HEART CATH AND CORONARY ANGIOGRAPHY;  Surgeon: Swaziland, Peter M, MD;  Location: [facility] INVASIVE CV LAB;  Service: Cardiovascular;  Laterality: N/A;   LEFT HEART CATHETERIZATION WITH CORONARY ANGIOGRAM N/A 03/17/2012   Procedure: LEFT HEART CATHETERIZATION WITH CORONARY ANGIOGRAM;  Surgeon: Chrystie Nose, MD;  Location:  [facility] CATH LAB;  Service: Cardiovascular;  Laterality: N/A;   PERCUTANEOUS CORONARY STENT INTERVENTION (PCI-S)  03/17/2012   Procedure: PERCUTANEOUS CORONARY STENT INTERVENTION (PCI-S);  Surgeon: Chrystie Nose, MD;  Location: [facility] CATH LAB;  Service: Cardiovascular;;   POLYPECTOMY  02/25/2017   Procedure: POLYPECTOMY;  Surgeon: West Bali, MD;  Location: [facility];  Service: Endoscopy;;  cecal x2; ascending x4;hepatic flexure;splenic flexure; descending x2;   POLYPECTOMY  04/15/2020   Procedure: POLYPECTOMY;  Surgeon: Lanelle Bal, DO;  Location: [facility];  Service: Endoscopy;;  cecal    TRANSTHORACIC ECHOCARDIOGRAM  03/29/2006   EF normal; mild MR & TR; AV mildly sclerotic    FAMHx:  Family History  Problem Relation Age of Onset   Heart disease Brother        x3   Stroke Father     SOCHx:   reports that he has been smoking cigarettes. He has a 4 pack-year smoking history. He has never used smokeless tobacco. He reports that he does not drink alcohol and does not use drugs.  ALLERGIES:  No Known Allergies  ROS: Pertinent items noted in HPI and remainder of comprehensive ROS otherwise negative.  HOME MEDS: Current Outpatient Medications  Medication Sig Dispense Refill   acetaminophen (TYLENOL) 500 MG tablet Take 500 mg by mouth every 6 (six) hours as needed for moderate pain.     albuterol (VENTOLIN HFA) 108 (90 Base) MCG/ACT inhaler Inhale 2 puffs into the lungs every 4 (four) hours as needed for wheezing or shortness of breath.     amLODipine (NORVASC) 5 MG tablet Take 5 mg by  mouth daily.     amoxicillin-clavulanate (AUGMENTIN) 875-125 MG tablet Take 1 tablet by mouth daily. Take the morning of procedure on 03/06/23 1 tablet 0   aspirin EC 81 MG tablet Take 81 mg by mouth daily.     clopidogrel (PLAVIX) 75 MG tablet Take 1 tablet (75 mg total) by mouth daily. 30 tablet 11   gabapentin (NEURONTIN) 300 MG capsule Take 300 mg by mouth 3 (three) times daily as needed (pain).     loratadine (CLARITIN) 10 MG tablet Take 10 mg by mouth daily.     metoprolol tartrate (LOPRESSOR) 25 MG tablet Take 0.5 tablets (12.5 mg total) by mouth 2 (two) times daily. 30 tablet 5   nitroGLYCERIN (NITROSTAT) 0.4 MG SL tablet Place 1 tablet (0.4 mg total) under the tongue every 5 (five) minutes as needed for chest pain (up to 3 doses). 25 tablet 3   tamsulosin (FLOMAX) 0.4 MG CAPS capsule Take 1 capsule (0.4 mg total) by mouth daily after supper. 30 capsule 11   rosuvastatin (CRESTOR) 20 MG tablet Take 1 tablet (20 mg total) by mouth daily. 90 tablet 3   No current facility-administered medications for this visit.    LABS/IMAGING: No results found for this or any previous visit (from the past 48 hours). No results found.  VITALS: BP 120/70 (BP Location: Left Arm, Patient Position: Sitting, Cuff Size: Normal)   Pulse (!) 49   Ht 6' (1.829 m)   Wt 148 lb 9.6 oz (67.4 kg)   SpO2 100%   BMI 20.15 kg/m   EXAM: General appearance: alert and no distress Neck: no carotid bruit and no JVD Lungs: clear to auscultation bilaterally Heart: regular rate and rhythm, S1, S2 normal, no murmur, click, rub or gallop Abdomen: soft, non-tender; bowel sounds normal; no masses,  no organomegaly Extremities: extremities normal, atraumatic, no cyanosis  or edema Pulses: 2+ and symmetric Skin: Skin color, texture, turgor normal. No rashes or lesions Neurologic: Grossly normal Psych: Mood, affect normal  EKG: Deferred  ASSESSMENT: Coronary artery disease status post PCI to the mid to distal LAD  with a Xience DES (03/2012) and repeat PCI to the distal LAD (06/2022) Dyslipidemia - at goal Hypertension - controlled Tobacco dependence -ready to quit Anterolateral and inferior T-wave abnormalities-stable Prostate cancer  PLAN: 1.   Robert Shaw indicated that he is ready to quit smoking.  He has not tried any agents to help him with this process.  He understands that he will need to get rid of any paraphernalia and remove himself from smokers and set a quit date.  Will plan to start with Wellbutrin 150 mg daily x 3 days then up to 150 mg twice a day for up to 8 weeks.  He should set a quit date between 4 to 8 weeks.  He was provided the 1 800 [employer] phone number and encouraged to reach out to [place] if he needs any assistance.  Other than that he had PCI to the LAD in May 2024.  He could discontinue clopidogrel in May 2025 (3 months from now).  He should remain on low-dose aspirin.  Plan follow-up with me in 6 months or sooner as necessary.  Chrystie Nose, MD, [facility], FACP  [place]  [facility]  Medical Director of the [facility]  [facility] Diplomate of the American Board of Clinical Lipidology Attending Cardiologist  Direct Dial: [phone]  Fax: [phone]  Website:  [email or web address] Nicely Trenise Turay 03/29/2023, 9:12 AM

## 2023-04-02 DIAGNOSIS — I2510 Atherosclerotic heart disease of native coronary artery without angina pectoris: Secondary | ICD-10-CM | POA: Diagnosis not present

## 2023-04-02 DIAGNOSIS — I10 Essential (primary) hypertension: Secondary | ICD-10-CM | POA: Diagnosis not present

## 2023-04-06 ENCOUNTER — Other Ambulatory Visit: Payer: Self-pay

## 2023-04-06 ENCOUNTER — Emergency Department (HOSPITAL_COMMUNITY)
Admission: EM | Admit: 2023-04-06 | Discharge: 2023-04-06 | Disposition: A | Discharge status: Home / Self Care | Payer: Medicare Other | Attending: Emergency Medicine | Admitting: Emergency Medicine

## 2023-04-06 DIAGNOSIS — Z8546 Personal history of malignant neoplasm of prostate: Secondary | ICD-10-CM | POA: Insufficient documentation

## 2023-04-06 DIAGNOSIS — I10 Essential (primary) hypertension: Secondary | ICD-10-CM | POA: Diagnosis not present

## 2023-04-06 DIAGNOSIS — Z7982 Long term (current) use of aspirin: Secondary | ICD-10-CM | POA: Insufficient documentation

## 2023-04-06 DIAGNOSIS — R339 Retention of urine, unspecified: Secondary | ICD-10-CM | POA: Diagnosis not present

## 2023-04-06 DIAGNOSIS — I2510 Atherosclerotic heart disease of native coronary artery without angina pectoris: Secondary | ICD-10-CM | POA: Diagnosis not present

## 2023-04-06 DIAGNOSIS — Z79899 Other long term (current) drug therapy: Secondary | ICD-10-CM | POA: Diagnosis not present

## 2023-04-06 LAB — URINALYSIS, ROUTINE W REFLEX MICROSCOPIC
Bacteria, UA: NONE SEEN
Bilirubin Urine: NEGATIVE
Glucose, UA: NEGATIVE mg/dL
Hgb urine dipstick: NEGATIVE
Ketones, ur: NEGATIVE mg/dL
Leukocytes,Ua: NEGATIVE
Nitrite: NEGATIVE
Protein, ur: NEGATIVE mg/dL
Specific Gravity, Urine: 1.008 (ref 1.005–1.030)
pH: 6 (ref 5.0–8.0)

## 2023-04-06 LAB — BASIC METABOLIC PANEL
Anion gap: 8 (ref 5–15)
BUN: 13 mg/dL (ref 8–23)
CO2: 25 mmol/L (ref 22–32)
Calcium: 9.3 mg/dL (ref 8.9–10.3)
Chloride: 105 mmol/L (ref 98–111)
Creatinine, Ser: 1.37 mg/dL — ABNORMAL HIGH (ref 0.61–1.24)
GFR, Estimated: 55 mL/min — ABNORMAL LOW (ref >60)
Glucose, Bld: 110 mg/dL — ABNORMAL HIGH (ref 70–99)
Potassium: 4.8 mmol/L (ref 3.5–5.1)
Sodium: 138 mmol/L (ref 135–145)

## 2023-04-06 NOTE — ED Triage Notes (Signed)
 Pt c/o unable to urinate since 1am. Pt stated he is in pain from feeling full.

## 2023-04-06 NOTE — ED Provider Notes (Signed)
 [place] EMERGENCY DEPARTMENT AT [facility] Provider Note   CSN: [id number] Arrival date & time: 04/06/23  7829     History  Chief Complaint  Patient presents with   Urinary Retention    Robert Shaw is a 73 y.o. male.  HPI Patient with history of low-grade prostate cancer.  Since around 1 in the morning unable to urinate.  Feels like he has to go.  Has seen Dr. Ronne Binning in the past.  No fevers.   Past Medical History:  Diagnosis Date   Bradycardia 03/18/2012   Coronary artery disease    Dyslipidemia    Family history of heart disease    History of nuclear stress test 07/13/2008   dipyridamole; normal perfusion, no ischemia, low risk    Hypertension    NSTEMI (non-ST elevated myocardial infarction) (HCC)    h/o   Tobacco abuse     Home Medications Prior to Admission medications   Medication Sig Start Date End Date Taking? Authorizing Provider  acetaminophen (TYLENOL) 500 MG tablet Take 500 mg by mouth every 6 (six) hours as needed for moderate pain.    [provider]  albuterol (VENTOLIN HFA) 108 (90 Base) MCG/ACT inhaler Inhale 2 puffs into the lungs every 4 (four) hours as needed for wheezing or shortness of breath. 09/16/19   [provider]  amLODipine (NORVASC) 5 MG tablet Take 5 mg by mouth daily. 09/27/21   [provider]  amoxicillin-clavulanate (AUGMENTIN) 875-125 MG tablet Take 1 tablet by mouth daily. Take the morning of procedure on 03/06/23 01/25/23   Malen Gauze, MD  aspirin EC 81 MG tablet Take 81 mg by mouth daily.    [provider]  buPROPion (WELLBUTRIN SR) 150 MG 12 hr tablet Take 1 tablet (150 mg total) by mouth 2 (two) times daily. Take 150 mg once daily for 3 days, after this Take 150 mg Twice daily for 8 weeks (For Smoking Cessation) 03/29/23   Hilty, Lisette Abu, MD  clopidogrel (PLAVIX) 75 MG tablet Take 1 tablet (75 mg total) by mouth daily. 06/27/22   Dunn, Tacey Ruiz, PA-C  gabapentin (NEURONTIN)  300 MG capsule Take 300 mg by mouth 3 (three) times daily as needed (pain). 10/13/20   [provider]  loratadine (CLARITIN) 10 MG tablet Take 10 mg by mouth daily.    [provider]  nitroGLYCERIN (NITROSTAT) 0.4 MG SL tablet Place 1 tablet (0.4 mg total) under the tongue every 5 (five) minutes as needed for chest pain (up to 3 doses). 06/27/22   Dunn, Tacey Ruiz, PA-C  rosuvastatin (CRESTOR) 20 MG tablet Take 1 tablet (20 mg total) by mouth daily. 12/13/22 03/13/23  Chrystie Nose, MD  tamsulosin (FLOMAX) 0.4 MG CAPS capsule Take 1 capsule (0.4 mg total) by mouth daily after supper. 01/16/23   McKenzie, Mardene Celeste, MD      Allergies    Patient has no known allergies.    Review of Systems   Review of Systems  Physical Exam Updated Vital Signs BP (!) 164/93   Pulse 65   Temp 97.7 F (36.5 C) (Oral)   Resp 17   Ht 6' (1.829 m)   Wt 67.1 kg   SpO2 100%   BMI 20.07 kg/m  Physical Exam Vitals and nursing note reviewed.  Constitutional:      Comments: Appears uncomfortable.  Abdominal:     Tenderness: There is abdominal tenderness.     Comments: Lower abdominal tenderness with  fullness.  Neurological:     Mental Status: He is alert.     ED Results / Procedures / Treatments   Labs (all labs ordered are listed, but only abnormal results are displayed) Labs Reviewed  URINALYSIS, ROUTINE W REFLEX MICROSCOPIC - Abnormal; Notable for the following components:      Result Value   Color, Urine STRAW (*)    All other components within normal limits  BASIC METABOLIC PANEL - Abnormal; Notable for the following components:   Glucose, Bld 110 (*)    Creatinine, Ser 1.37 (*)    GFR, Estimated 55 (*)    All other components within normal limits    EKG None  Radiology No results found.  Procedures Procedures    Medications Ordered in ED Medications - No data to display  ED Course/ Medical Decision Making/ A&P                                 Medical  Decision Making Amount and/or Complexity of Data Reviewed Labs: ordered.   Patient clinically urinary retention.  History of prostate issues.  Will check urinalysis and basic metabolic panel.  Will place Foley catheter.  Has more than 800 cc of urine in the bladder.  Foley catheter is been placed and patient is feeling much better.  Kidney function stable.  Urine does not show infection.  Will discharge home.  Follow-up with Dr. Ronne Binning.  Instruction given on Foley catheter.        Final Clinical Impression(s) / ED Diagnoses Final diagnoses:  Urinary retention    Rx / DC Orders ED Discharge Orders     None         Benjiman Core, MD 04/06/23 [phone]

## 2023-04-08 ENCOUNTER — Telehealth: Payer: Self-pay | Admitting: Urology

## 2023-04-08 NOTE — Telephone Encounter (Signed)
 Has cath and has appt 3/5, he is urinating blood

## 2023-04-08 NOTE — Telephone Encounter (Signed)
 Patient is made aware to increase fluids and the blood he is seeing should decrease in a day or two. Patient voiced understanding.

## 2023-04-08 NOTE — Telephone Encounter (Signed)
 Patient wife called patient was in ER and had cath placed, she was calling to get cath removed.  Patient was not started on any medication .  Patient has appt on 04/19/23 already.

## 2023-04-08 NOTE — Telephone Encounter (Signed)
 Returned phone call to Pt wife. Wife just wanted to know if he had to keep cath in and does he need to stop or start any medication. Pt advised to keep cath until upcoming appointment 03/07 and MD will make all determination then

## 2023-04-19 ENCOUNTER — Ambulatory Visit: Payer: Medicare Other | Admitting: Urology

## 2023-04-19 VITALS — BP 153/88 | HR 74

## 2023-04-19 DIAGNOSIS — N138 Other obstructive and reflux uropathy: Secondary | ICD-10-CM | POA: Diagnosis not present

## 2023-04-19 DIAGNOSIS — N401 Enlarged prostate with lower urinary tract symptoms: Secondary | ICD-10-CM

## 2023-04-19 DIAGNOSIS — R339 Retention of urine, unspecified: Secondary | ICD-10-CM

## 2023-04-19 DIAGNOSIS — R351 Nocturia: Secondary | ICD-10-CM

## 2023-04-19 DIAGNOSIS — C61 Malignant neoplasm of prostate: Secondary | ICD-10-CM

## 2023-04-19 MED ORDER — TAMSULOSIN HCL 0.4 MG PO CAPS: 0.4000 mg | ORAL_CAPSULE | Freq: Two times a day (BID) | ORAL | 11 refills | Status: DC

## 2023-04-19 NOTE — Progress Notes (Signed)
 04/19/2023 11:24 AM   Robert Shaw 28-Mar-1950 [id number]  Referring provider: Benetta Spar, MD [address] [place],  [place] [address]  Urinary retention   HPI: [name] is a 73yo here for 73yo here for evaluation of urinary retnetion. He had a foley placed 2/22 in the ER. He has been on flomax 0.4mg  daily for several years. No prior issues with retention. He denies nay constipation. No other complaints today   PMH: Past Medical History:  Diagnosis Date   Bradycardia 03/18/2012   Coronary artery disease    Dyslipidemia    Family history of heart disease    History of nuclear stress test 07/13/2008   dipyridamole; normal perfusion, no ischemia, low risk    Hypertension    NSTEMI (non-ST elevated myocardial infarction) (HCC)    h/o   Tobacco abuse     Surgical History: Past Surgical History:  Procedure Laterality Date   CARDIAC CATHETERIZATION  04/2001   after high lateral MI, preserved LV function w/mild mid anterolateral hypokinesia, diffuse coronary irregularity with signficiant obstruction in LAD, OM1, superior branch of ramus (Dr. Baxter Hire - [facility])   CARDIAC CATHETERIZATION  09/23/2001   patent stents from 06/2001 (Dr. Evlyn Courier)   CARDIAC CATHETERIZATION  09/17/2002   no restenosis at prior intervention sites (Dr. Bishop Limbo)   CARDIAC CATHETERIZATION  09/09/2009   no significant CAD, patent stents in OM1 & LAD (Dr. Bishop Limbo)    COLONOSCOPY N/A 02/25/2017   Procedure: COLONOSCOPY;  Surgeon: West Bali, MD;  [facility];  Service: Endoscopy;  Laterality: N/A;  10:30 Am   COLONOSCOPY WITH PROPOFOL N/A 04/15/2020   Procedure: COLONOSCOPY WITH PROPOFOL;  Surgeon: Lanelle Bal, DO;  [facility];  Service: Endoscopy;  Laterality: N/A;  pm ASA II/   CORONARY ANGIOPLASTY WITH STENT PLACEMENT  06/19/2001   Cfx (3.5x29mm Zeta stent) & diagonal stenting (2.5x72mm pixel stent) w/moderate LAD disease (Dr. Bishop Limbo)     CORONARY ANGIOPLASTY WITH STENT PLACEMENT  03/17/2012   Xience DES (2.25x71mm) of mid-distal LAD (Dr. Bishop Limbo)   CORONARY STENT INTERVENTION N/A 06/26/2022   Procedure: CORONARY STENT INTERVENTION;  Surgeon: Swaziland, Peter M, MD;  Location: [facility] INVASIVE CV LAB;  Service: Cardiovascular;  Laterality: N/A;   LEFT HEART CATH AND CORONARY ANGIOGRAPHY N/A 06/26/2022   Procedure: LEFT HEART CATH AND CORONARY ANGIOGRAPHY;  Surgeon: Swaziland, Peter M, MD;  Location: [facility] INVASIVE CV LAB;  Service: Cardiovascular;  Laterality: N/A;   LEFT HEART CATHETERIZATION WITH CORONARY ANGIOGRAM N/A 03/17/2012   Procedure: LEFT HEART CATHETERIZATION WITH CORONARY ANGIOGRAM;  Surgeon: Chrystie Nose, MD;  Location: [facility] CATH LAB;  Service: Cardiovascular;  Laterality: N/A;   PERCUTANEOUS CORONARY STENT INTERVENTION (PCI-S)  03/17/2012   Procedure: PERCUTANEOUS CORONARY STENT INTERVENTION (PCI-S);  Surgeon: Chrystie Nose, MD;  Location: [facility] CATH LAB;  Service: Cardiovascular;;   POLYPECTOMY  02/25/2017   Procedure: POLYPECTOMY;  Surgeon: West Bali, MD;  [facility];  Service: Endoscopy;;  cecal x2; ascending x4;hepatic flexure;splenic flexure; descending x2;   POLYPECTOMY  04/15/2020   Procedure: POLYPECTOMY;  Surgeon: Lanelle Bal, DO;  [facility];  Service: Endoscopy;;  cecal    TRANSTHORACIC ECHOCARDIOGRAM  03/29/2006   EF normal; mild MR & TR; AV mildly sclerotic    Home Medications:  Allergies as of 04/19/2023   No Known Allergies      Medication List  Accurate as of April 19, 2023 11:24 AM. If you have any questions, ask your nurse or doctor.          acetaminophen 500 MG tablet Commonly known as: TYLENOL Take 500 mg by mouth every 6 (six) hours as needed for moderate pain.   albuterol 108 (90 Base) MCG/ACT inhaler Commonly known as: VENTOLIN HFA Inhale 2 puffs into the lungs every 4 (four) hours as needed for wheezing or shortness of breath.   amLODipine 5 MG  tablet Commonly known as: NORVASC Take 5 mg by mouth daily.   amoxicillin-clavulanate 875-125 MG tablet Commonly known as: AUGMENTIN Take 1 tablet by mouth daily. Take the morning of procedure on 03/06/23   aspirin EC 81 MG tablet Take 81 mg by mouth daily.   buPROPion 150 MG 12 hr tablet Commonly known as: Wellbutrin SR Take 1 tablet (150 mg total) by mouth 2 (two) times daily. Take 150 mg once daily for 3 days, after this Take 150 mg Twice daily for 8 weeks (For Smoking Cessation)   clopidogrel 75 MG tablet Commonly known as: PLAVIX Take 1 tablet (75 mg total) by mouth daily.   gabapentin 300 MG capsule Commonly known as: NEURONTIN Take 300 mg by mouth 3 (three) times daily as needed (pain).   loratadine 10 MG tablet Commonly known as: CLARITIN Take 10 mg by mouth daily.   nitroGLYCERIN 0.4 MG SL tablet Commonly known as: NITROSTAT Place 1 tablet (0.4 mg total) under the tongue every 5 (five) minutes as needed for chest pain (up to 3 doses).   rosuvastatin 20 MG tablet Commonly known as: CRESTOR Take 1 tablet (20 mg total) by mouth daily.   tamsulosin 0.4 MG Caps capsule Commonly known as: FLOMAX Take 1 capsule (0.4 mg total) by mouth daily after supper.        Allergies: No Known Allergies  Family History: Family History  Problem Relation Age of Onset   Heart disease Brother        x3   Stroke Father     Social History:  reports that he has been smoking cigarettes. He has a 4 pack-year smoking history. He has never used smokeless tobacco. He reports that he does not drink alcohol and does not use drugs.  ROS: All other review of systems were reviewed and are negative except what is noted above in HPI  Physical Exam: BP (!) 153/88   Pulse 74   Constitutional:  Alert and oriented, No acute distress. HEENT: [place] AT, moist mucus membranes.  Trachea midline, no masses. Cardiovascular: No clubbing, cyanosis, or edema. Respiratory: Normal respiratory effort,  no increased work of breathing. GI: Abdomen is soft, nontender, nondistended, no abdominal masses GU: No CVA tenderness.  Lymph: No cervical or inguinal lymphadenopathy. Skin: No rashes, bruises or suspicious lesions. Neurologic: Grossly intact, no focal deficits, moving all 4 extremities. Psychiatric: Normal mood and affect.  Laboratory Data: Lab Results  Component Value Date   WBC 5.6 09/11/2022   HGB 11.8 (L) 09/11/2022   HCT 35.4 (L) 09/11/2022   MCV 89 09/11/2022   PLT 219 09/11/2022    Lab Results  Component Value Date   CREATININE 1.37 (H) 04/06/2023    No results found for: "PSA"  No results found for: "TESTOSTERONE"  Lab Results  Component Value Date   HGBA1C 5.9 (H) 06/26/2022    Urinalysis    Component Value Date/Time   COLORURINE STRAW (A) 04/06/2023 0818   APPEARANCEUR CLEAR 04/06/2023 0818  APPEARANCEUR Clear 01/16/2023 1029   LABSPEC 1.008 04/06/2023 0818   PHURINE 6.0 04/06/2023 0818   GLUCOSEU NEGATIVE 04/06/2023 0818   HGBUR NEGATIVE 04/06/2023 0818   BILIRUBINUR NEGATIVE 04/06/2023 0818   BILIRUBINUR Negative 01/16/2023 1029   KETONESUR NEGATIVE 04/06/2023 0818   PROTEINUR NEGATIVE 04/06/2023 0818   UROBILINOGEN 0.2 06/17/2007 1054   NITRITE NEGATIVE 04/06/2023 0818   LEUKOCYTESUR NEGATIVE 04/06/2023 0818    Lab Results  Component Value Date   LABMICR Comment 01/16/2023   WBCUA >30 (A) 11/23/2022   LABEPIT 0-10 11/23/2022   BACTERIA NONE SEEN 04/06/2023    Pertinent Imaging:  No results found for this or any previous visit.  No results found for this or any previous visit.  No results found for this or any previous visit.  No results found for this or any previous visit.  No results found for this or any previous visit.  No results found for this or any previous visit.  No results found for this or any previous visit.  No results found for this or any previous visit.   Assessment & Plan:    1. Urinary retention  (Primary) -increase flomax to 0.4mg  BID. Followup in 1 week fro a voiding trial  2. Benign prostatic hyperplasia with urinary obstruction Increase flomax to BID    No follow-ups on file.  Wilkie Aye, MD  [facility] 

## 2023-04-23 ENCOUNTER — Encounter: Payer: Self-pay | Admitting: Urology

## 2023-04-23 NOTE — Patient Instructions (Signed)
 Trouble Peeing (Acute Urinary Retention) in Males: What to Know Acute urinary retention is when a person can't pee at all or can only pee a little. This can come on all of a sudden. If it's not treated, it can lead to kidney problems or other serious problems. What are the causes? Acute urinary retention may be caused by: A problem with the urethra. This is the tube that drains pee from the bladder. Problems with the nerves in the bladder. Tumors. Some medicines. An infection. Having trouble pooping (constipation). What increases the risk? Older males are more at risk because their prostate gland may get larger as they age. Other health problems can also raise the risk. These include: Multiple sclerosis. Injury to the spinal cord. Diabetes. A condition that affects the way the brain works, such as dementia. Mental health problems. Having urinary retention before. What are the signs or symptoms? Trouble peeing. Pain in the lower belly. How is this treated? Treatment may include: Medicines. Treatment for problems that may cause this. Placing a soft tube called a catheter into the bladder to drain pee out of the body. Therapy to treat mental health problems. If needed, you may be treated in the hospital for kidney problems. Follow these instructions at home: Medicines Take medicines only as told. Do not take any medicine unless your doctor says it's OK. If you were given antibiotics, take them as told. Do not stop taking them even if you start to feel better. General instructions Do not smoke, vape, or use nicotine or tobacco. Drink more fluids as told. If you need to use a catheter, follow the instructions closely. This will help prevent a bladder infection. If told, keep track of changes in your blood pressure at home. Tell your doctor about them. Contact a doctor if: You have bladder cramping, called spasms. You leak pee when you have spasms. You have a fever or chills. You  have blood in your pee. Get help right away if: You can't pee. This information is not intended to replace advice given to you by your health care provider. Make sure you discuss any questions you have with your health care provider. Document Revised: 09/27/2022 Document Reviewed: 09/27/2022 Elsevier Patient Education  2024 [employer].

## 2023-04-25 ENCOUNTER — Ambulatory Visit (INDEPENDENT_AMBULATORY_CARE_PROVIDER_SITE_OTHER)

## 2023-04-25 DIAGNOSIS — R339 Retention of urine, unspecified: Secondary | ICD-10-CM

## 2023-04-25 MED ORDER — CIPROFLOXACIN HCL 500 MG PO TABS
500.0000 mg | ORAL_TABLET | Freq: Once | ORAL | Status: AC
  Administered 2023-04-25: 500 mg via ORAL

## 2023-04-25 NOTE — Progress Notes (Addendum)
 Fill and Pull Catheter Removal  Patient is present today for a catheter removal.  of sterile water was instilled into the bladder when the patient felt the urge to urinate. Patient had a bladder spasm around catheter. 10ml of water was then drained from the balloon.  A 16FR foley cath was removed from the bladder no complications were noted. Foley catheter intact and time of removal. Patient as then given some time to void on their own.  Patient can void  on their own after some time.  Patient tolerated well.  One oral prophylactic antibiotic given per MD orders  Bladder Scan completed today.  Patient can void prior to the bladder scan. Bladder scan result: 149   Performed by: Alfonse Spruce. CMA  Follow up/ Additional notes: Patient will return this afternoon for PVR

## 2023-04-25 NOTE — Progress Notes (Addendum)
 Bladder Scan completed today.  Patient can void prior to the bladder scan. Bladder scan result: 27  Performed By: Alfonse Spruce. CMA  Additional notes-

## 2023-04-30 DIAGNOSIS — I10 Essential (primary) hypertension: Secondary | ICD-10-CM | POA: Diagnosis not present

## 2023-04-30 DIAGNOSIS — I2510 Atherosclerotic heart disease of native coronary artery without angina pectoris: Secondary | ICD-10-CM | POA: Diagnosis not present

## 2023-05-01 ENCOUNTER — Ambulatory Visit

## 2023-05-01 DIAGNOSIS — R339 Retention of urine, unspecified: Secondary | ICD-10-CM

## 2023-05-01 NOTE — Progress Notes (Addendum)
 Bladder Scan completed today.  Patient can void prior to the bladder scan. Bladder scan result: 21  Performed By: Alfonse Spruce. CMA  Additional notes-

## 2023-05-17 ENCOUNTER — Other Ambulatory Visit: Payer: Self-pay

## 2023-05-17 MED ORDER — CLOPIDOGREL BISULFATE 75 MG PO TABS: 75.0000 mg | ORAL_TABLET | Freq: Every day | ORAL | 3 refills | Status: DC

## 2023-05-20 ENCOUNTER — Ambulatory Visit (HOSPITAL_COMMUNITY)

## 2023-05-31 DIAGNOSIS — I2510 Atherosclerotic heart disease of native coronary artery without angina pectoris: Secondary | ICD-10-CM | POA: Diagnosis not present

## 2023-05-31 DIAGNOSIS — I10 Essential (primary) hypertension: Secondary | ICD-10-CM | POA: Diagnosis not present

## 2023-06-10 ENCOUNTER — Ambulatory Visit (HOSPITAL_COMMUNITY)
Admission: RE | Admit: 2023-06-10 | Discharge: 2023-06-10 | Disposition: A | Discharge status: Home / Self Care | Source: Ambulatory Visit | Attending: Urology | Admitting: Urology

## 2023-06-10 DIAGNOSIS — C61 Malignant neoplasm of prostate: Secondary | ICD-10-CM | POA: Diagnosis present

## 2023-06-10 DIAGNOSIS — K5730 Diverticulosis of large intestine without perforation or abscess without bleeding: Secondary | ICD-10-CM | POA: Diagnosis not present

## 2023-06-10 MED ORDER — GADOBUTROL 1 MMOL/ML IV SOLN
7.0000 mL | Freq: Once | INTRAVENOUS | Status: AC | PRN
  Administered 2023-06-10: 7 mL via INTRAVENOUS

## 2023-06-10 MED ORDER — GADOBUTROL 1 MMOL/ML IV SOLN: 7.0000 mL | Freq: Once | INTRAVENOUS | Status: DC | PRN

## 2023-06-12 ENCOUNTER — Other Ambulatory Visit: Payer: Medicare Other

## 2023-06-12 DIAGNOSIS — C61 Malignant neoplasm of prostate: Secondary | ICD-10-CM

## 2023-06-13 LAB — PSA: Prostate Specific Ag, Serum: 33.3 ng/mL — ABNORMAL HIGH (ref 0.0–4.0)

## 2023-06-18 ENCOUNTER — Telehealth: Payer: Self-pay

## 2023-06-18 MED ORDER — DOXYCYCLINE HYCLATE 100 MG PO CAPS: 100.0000 mg | ORAL_CAPSULE | Freq: Two times a day (BID) | ORAL | 0 refills | Status: DC

## 2023-06-18 NOTE — Telephone Encounter (Signed)
-----   Message from Johnie Nailer sent at 06/18/2023  7:44 AM EDT ----- Give him doxyccyline 100mg  BID for 28 days ----- Message ----- From: Garner Jury Lab Results In Sent: 06/13/2023   5:38 AM EDT To: Marco Severs, MD

## 2023-06-18 NOTE — Telephone Encounter (Signed)
 Patient called and made aware of elevated psa and antibiotic sent to pharmacy.

## 2023-06-19 ENCOUNTER — Ambulatory Visit: Admitting: Urology

## 2023-06-19 ENCOUNTER — Ambulatory Visit: Payer: Medicare Other | Admitting: Urology

## 2023-06-19 VITALS — BP 137/73 | HR 69

## 2023-06-19 DIAGNOSIS — N138 Other obstructive and reflux uropathy: Secondary | ICD-10-CM

## 2023-06-19 DIAGNOSIS — C61 Malignant neoplasm of prostate: Secondary | ICD-10-CM | POA: Diagnosis not present

## 2023-06-19 DIAGNOSIS — N401 Enlarged prostate with lower urinary tract symptoms: Secondary | ICD-10-CM

## 2023-06-19 DIAGNOSIS — R338 Other retention of urine: Secondary | ICD-10-CM | POA: Diagnosis not present

## 2023-06-19 DIAGNOSIS — R339 Retention of urine, unspecified: Secondary | ICD-10-CM

## 2023-06-19 MED ORDER — TAMSULOSIN HCL 0.4 MG PO CAPS: 0.4000 mg | ORAL_CAPSULE | Freq: Two times a day (BID) | ORAL | 11 refills | Status: DC

## 2023-06-19 NOTE — Progress Notes (Signed)
 06/19/2023 8:48 AM   Robert Shaw 10-04-1950 [id number]  Referring provider: Wyvonna Heidelberg, MD [address] [place],  [place] [address]  Followup prostate cancer   HPI: Robert Shaw is a 72yo here for followup for BPh and Prostate cancer. MRi shows no suspicious lesions. PSA increased to 33 from 18. He had urinary retention requiring foley placement 6 weeks ago. He denies any worsening LUTS currently.    PMH: Past Medical History:  Diagnosis Date   Bradycardia 03/18/2012   Coronary artery disease    Dyslipidemia    Family history of heart disease    History of nuclear stress test 07/13/2008   dipyridamole; normal perfusion, no ischemia, low risk    Hypertension    NSTEMI (non-ST elevated myocardial infarction) (HCC)    h/o   Tobacco abuse     Surgical History: Past Surgical History:  Procedure Laterality Date   CARDIAC CATHETERIZATION  04/2001   after high lateral MI, preserved LV function w/mild mid anterolateral hypokinesia, diffuse coronary irregularity with signficiant obstruction in LAD, OM1, superior branch of ramus (Dr. Adriana Hopping - [facility])   CARDIAC CATHETERIZATION  09/23/2001   patent stents from 06/2001 (Dr. Jammie Mccune)   CARDIAC CATHETERIZATION  09/17/2002   no restenosis at prior intervention sites (Dr. Electa Grieve)   CARDIAC CATHETERIZATION  09/09/2009   no significant CAD, patent stents in OM1 & LAD (Dr. Electa Grieve)    COLONOSCOPY N/A 02/25/2017   Procedure: COLONOSCOPY;  Surgeon: Alyce Jubilee, MD;  [facility];  Service: Endoscopy;  Laterality: N/A;  10:30 Am   COLONOSCOPY WITH PROPOFOL  N/A 04/15/2020   Procedure: COLONOSCOPY WITH PROPOFOL ;  Surgeon: Vinetta Greening, DO;  [facility];  Service: Endoscopy;  Laterality: N/A;  pm ASA II/   CORONARY ANGIOPLASTY WITH STENT PLACEMENT  06/19/2001   Cfx (3.5x8mm Zeta stent) & diagonal stenting (2.5x61mm pixel stent) w/moderate LAD disease (Dr. Electa Grieve)    CORONARY ANGIOPLASTY WITH STENT PLACEMENT  03/17/2012   Xience DES (2.25x51mm) of mid-distal LAD (Dr. Electa Grieve)   CORONARY STENT INTERVENTION N/A 06/26/2022   Procedure: CORONARY STENT INTERVENTION;  Surgeon: Swaziland, Peter M, MD;  Location: [facility] INVASIVE CV LAB;  Service: Cardiovascular;  Laterality: N/A;   LEFT HEART CATH AND CORONARY ANGIOGRAPHY N/A 06/26/2022   Procedure: LEFT HEART CATH AND CORONARY ANGIOGRAPHY;  Surgeon: Swaziland, Peter M, MD;  Location: [facility] INVASIVE CV LAB;  Service: Cardiovascular;  Laterality: N/A;   LEFT HEART CATHETERIZATION WITH CORONARY ANGIOGRAM N/A 03/17/2012   Procedure: LEFT HEART CATHETERIZATION WITH CORONARY ANGIOGRAM;  Surgeon: Hazle Lites, MD;  Location: [facility] CATH LAB;  Service: Cardiovascular;  Laterality: N/A;   PERCUTANEOUS CORONARY STENT INTERVENTION (PCI-S)  03/17/2012   Procedure: PERCUTANEOUS CORONARY STENT INTERVENTION (PCI-S);  Surgeon: Hazle Lites, MD;  Location: [facility] CATH LAB;  Service: Cardiovascular;;   POLYPECTOMY  02/25/2017   Procedure: POLYPECTOMY;  Surgeon: Alyce Jubilee, MD;  [facility];  Service: Endoscopy;;  cecal x2; ascending x4;hepatic flexure;splenic flexure; descending x2;   POLYPECTOMY  04/15/2020   Procedure: POLYPECTOMY;  Surgeon: Vinetta Greening, DO;  [facility];  Service: Endoscopy;;  cecal    TRANSTHORACIC ECHOCARDIOGRAM  03/29/2006   EF normal; mild Robert & TR; AV mildly sclerotic    Home Medications:  Allergies as of 06/19/2023   No Known Allergies      Medication List  Accurate as of Jun 19, 2023  8:48 AM. If you have any questions, ask your nurse or doctor.          acetaminophen  500 MG tablet Commonly known as: TYLENOL  Take 500 mg by mouth every 6 (six) hours as needed for moderate pain.   albuterol  108 (90 Base) MCG/ACT inhaler Commonly known as: VENTOLIN  HFA Inhale 2 puffs into the lungs every 4 (four) hours as needed for wheezing or shortness of breath.   amLODipine  5  MG tablet Commonly known as: NORVASC  Take 5 mg by mouth daily.   amoxicillin -clavulanate 875-125 MG tablet Commonly known as: AUGMENTIN  Take 1 tablet by mouth daily. Take the morning of procedure on 03/06/23   aspirin  EC 81 MG tablet Take 81 mg by mouth daily.   buPROPion  150 MG 12 hr tablet Commonly known as: Wellbutrin  SR Take 1 tablet (150 mg total) by mouth 2 (two) times daily. Take 150 mg once daily for 3 days, after this Take 150 mg Twice daily for 8 weeks (For Smoking Cessation)   clopidogrel  75 MG tablet Commonly known as: PLAVIX  Take 1 tablet (75 mg total) by mouth daily.   doxycycline  100 MG capsule Commonly known as: VIBRAMYCIN  Take 1 capsule (100 mg total) by mouth 2 (two) times daily.   gabapentin  300 MG capsule Commonly known as: NEURONTIN  Take 300 mg by mouth 3 (three) times daily as needed (pain).   loratadine  10 MG tablet Commonly known as: CLARITIN  Take 10 mg by mouth daily.   nitroGLYCERIN  0.4 MG SL tablet Commonly known as: NITROSTAT  Place 1 tablet (0.4 mg total) under the tongue every 5 (five) minutes as needed for chest pain (up to 3 doses).   rosuvastatin  20 MG tablet Commonly known as: CRESTOR  Take 1 tablet (20 mg total) by mouth daily.   tamsulosin  0.4 MG Caps capsule Commonly known as: FLOMAX  Take 1 capsule (0.4 mg total) by mouth in the morning and at bedtime.        Allergies: No Known Allergies  Family History: Family History  Problem Relation Age of Onset   Heart disease Brother        x3   Stroke Father     Social History:  reports that he has been smoking cigarettes. He has a 4 pack-year smoking history. He has never used smokeless tobacco. He reports that he does not drink alcohol and does not use drugs.  ROS: All other review of systems were reviewed and are negative except what is noted above in HPI  Physical Exam: BP 137/73   Pulse 69   Constitutional:  Alert and oriented, No acute distress. HEENT: [place] AT, moist mucus  membranes.  Trachea midline, no masses. Cardiovascular: No clubbing, cyanosis, or edema. Respiratory: Normal respiratory effort, no increased work of breathing. GI: Abdomen is soft, nontender, nondistended, no abdominal masses GU: No CVA tenderness.  Lymph: No cervical or inguinal lymphadenopathy. Skin: No rashes, bruises or suspicious lesions. Neurologic: Grossly intact, no focal deficits, moving all 4 extremities. Psychiatric: Normal mood and affect.  Laboratory Data: Lab Results  Component Value Date   WBC 5.6 09/11/2022   HGB 11.8 (L) 09/11/2022   HCT 35.4 (L) 09/11/2022   MCV 89 09/11/2022   PLT 219 09/11/2022    Lab Results  Component Value Date   CREATININE 1.37 (H) 04/06/2023    No results found for: "PSA"  No results found for: "TESTOSTERONE"  Lab Results  Component Value Date   HGBA1C 5.9 (H) 06/26/2022  Urinalysis    Component Value Date/Time   COLORURINE STRAW (A) 04/06/2023 0818   APPEARANCEUR CLEAR 04/06/2023 0818   APPEARANCEUR Clear 01/16/2023 1029   LABSPEC 1.008 04/06/2023 0818   PHURINE 6.0 04/06/2023 0818   GLUCOSEU NEGATIVE 04/06/2023 0818   HGBUR NEGATIVE 04/06/2023 0818   BILIRUBINUR NEGATIVE 04/06/2023 0818   BILIRUBINUR Negative 01/16/2023 1029   KETONESUR NEGATIVE 04/06/2023 0818   PROTEINUR NEGATIVE 04/06/2023 0818   UROBILINOGEN 0.2 06/17/2007 1054   NITRITE NEGATIVE 04/06/2023 0818   LEUKOCYTESUR NEGATIVE 04/06/2023 0818    Lab Results  Component Value Date   LABMICR Comment 01/16/2023   WBCUA >30 (A) 11/23/2022   LABEPIT 0-10 11/23/2022   BACTERIA NONE SEEN 04/06/2023    Pertinent Imaging: MRI 06/10/23: Images reviewed and discussed witht he patient  No results found for this or any previous visit.  No results found for this or any previous visit.  No results found for this or any previous visit.  No results found for this or any previous visit.  No results found for this or any previous visit.  No results found  for this or any previous visit.  No results found for this or any previous visit.  No results found for this or any previous visit.   Assessment & Plan:    1. Prostate cancer (HCC) (Primary) Followup 3 months with PSA - Urinalysis, Routine w reflex microscopic  2. Urinary retention Continue flomax  0.4mg  daily  3. Benign prostatic hyperplasia with urinary obstruction Continue flomax  0.4mg  daily   No follow-ups on file.  Johnie Nailer, MD  [facility] [place]

## 2023-06-24 ENCOUNTER — Telehealth: Payer: Self-pay

## 2023-06-24 NOTE — Telephone Encounter (Signed)
 Patient called in today and state's that he is having an adverse affect from the doxycycline . Patient state's he can tasted the medication coming up in his throat and he stop taking the medication. Patient was wondering if something else can be prescribe. Patient is made aware this could possible be acid reflex and Dr. Claretta Croft will be notified and someone will reach out with MD recommendation. Patient voiced understanding.

## 2023-06-24 NOTE — Telephone Encounter (Signed)
 Per Dr. Claretta Croft patient can take tum to help with the indigestion. Patient voiced understanding.

## 2023-06-25 ENCOUNTER — Encounter: Payer: Self-pay | Admitting: Urology

## 2023-06-25 NOTE — Patient Instructions (Signed)

## 2023-07-10 ENCOUNTER — Encounter (HOSPITAL_COMMUNITY): Payer: Self-pay | Admitting: Emergency Medicine

## 2023-07-10 ENCOUNTER — Emergency Department (HOSPITAL_COMMUNITY)

## 2023-07-10 ENCOUNTER — Inpatient Hospital Stay (HOSPITAL_COMMUNITY)
Admission: EM | Admit: 2023-07-10 | Discharge: 2023-07-11 | DRG: 040 | Disposition: A | Discharge status: Home / Self Care | Attending: Neurology | Admitting: Neurology

## 2023-07-10 DIAGNOSIS — Z8673 Personal history of transient ischemic attack (TIA), and cerebral infarction without residual deficits: Secondary | ICD-10-CM | POA: Diagnosis not present

## 2023-07-10 DIAGNOSIS — Z79899 Other long term (current) drug therapy: Secondary | ICD-10-CM | POA: Diagnosis not present

## 2023-07-10 DIAGNOSIS — R29703 NIHSS score 3: Secondary | ICD-10-CM | POA: Diagnosis present

## 2023-07-10 DIAGNOSIS — E785 Hyperlipidemia, unspecified: Secondary | ICD-10-CM | POA: Diagnosis present

## 2023-07-10 DIAGNOSIS — I63511 Cerebral infarction due to unspecified occlusion or stenosis of right middle cerebral artery: Secondary | ICD-10-CM | POA: Diagnosis not present

## 2023-07-10 DIAGNOSIS — N183 Chronic kidney disease, stage 3 unspecified: Secondary | ICD-10-CM | POA: Diagnosis present

## 2023-07-10 DIAGNOSIS — I6523 Occlusion and stenosis of bilateral carotid arteries: Secondary | ICD-10-CM | POA: Diagnosis not present

## 2023-07-10 DIAGNOSIS — Z7902 Long term (current) use of antithrombotics/antiplatelets: Secondary | ICD-10-CM

## 2023-07-10 DIAGNOSIS — G8324 Monoplegia of upper limb affecting left nondominant side: Secondary | ICD-10-CM | POA: Diagnosis not present

## 2023-07-10 DIAGNOSIS — R531 Weakness: Secondary | ICD-10-CM | POA: Diagnosis not present

## 2023-07-10 DIAGNOSIS — I252 Old myocardial infarction: Secondary | ICD-10-CM | POA: Diagnosis not present

## 2023-07-10 DIAGNOSIS — I639 Cerebral infarction, unspecified: Principal | ICD-10-CM

## 2023-07-10 DIAGNOSIS — Q2730 Arteriovenous malformation, site unspecified: Secondary | ICD-10-CM

## 2023-07-10 DIAGNOSIS — I10 Essential (primary) hypertension: Secondary | ICD-10-CM | POA: Diagnosis not present

## 2023-07-10 DIAGNOSIS — R29700 NIHSS score 0: Secondary | ICD-10-CM | POA: Diagnosis not present

## 2023-07-10 DIAGNOSIS — Q283 Other malformations of cerebral vessels: Secondary | ICD-10-CM | POA: Diagnosis present

## 2023-07-10 DIAGNOSIS — Z955 Presence of coronary angioplasty implant and graft: Secondary | ICD-10-CM | POA: Diagnosis not present

## 2023-07-10 DIAGNOSIS — Q282 Arteriovenous malformation of cerebral vessels: Secondary | ICD-10-CM | POA: Diagnosis not present

## 2023-07-10 DIAGNOSIS — R9082 White matter disease, unspecified: Secondary | ICD-10-CM | POA: Diagnosis not present

## 2023-07-10 DIAGNOSIS — Z7982 Long term (current) use of aspirin: Secondary | ICD-10-CM

## 2023-07-10 DIAGNOSIS — R1310 Dysphagia, unspecified: Secondary | ICD-10-CM | POA: Diagnosis present

## 2023-07-10 DIAGNOSIS — Z823 Family history of stroke: Secondary | ICD-10-CM | POA: Diagnosis not present

## 2023-07-10 DIAGNOSIS — F17210 Nicotine dependence, cigarettes, uncomplicated: Secondary | ICD-10-CM | POA: Diagnosis not present

## 2023-07-10 DIAGNOSIS — R202 Paresthesia of skin: Secondary | ICD-10-CM | POA: Diagnosis not present

## 2023-07-10 DIAGNOSIS — Z8249 Family history of ischemic heart disease and other diseases of the circulatory system: Secondary | ICD-10-CM

## 2023-07-10 DIAGNOSIS — I129 Hypertensive chronic kidney disease with stage 1 through stage 4 chronic kidney disease, or unspecified chronic kidney disease: Secondary | ICD-10-CM | POA: Diagnosis not present

## 2023-07-10 DIAGNOSIS — I2510 Atherosclerotic heart disease of native coronary artery without angina pectoris: Secondary | ICD-10-CM | POA: Diagnosis not present

## 2023-07-10 DIAGNOSIS — I672 Cerebral atherosclerosis: Secondary | ICD-10-CM | POA: Diagnosis not present

## 2023-07-10 DIAGNOSIS — Z982 Presence of cerebrospinal fluid drainage device: Secondary | ICD-10-CM | POA: Diagnosis not present

## 2023-07-10 DIAGNOSIS — Z743 Need for continuous supervision: Secondary | ICD-10-CM | POA: Diagnosis not present

## 2023-07-10 DIAGNOSIS — I6389 Other cerebral infarction: Secondary | ICD-10-CM | POA: Diagnosis not present

## 2023-07-10 LAB — RAPID URINE DRUG SCREEN, HOSP PERFORMED
Amphetamines: NOT DETECTED
Barbiturates: NOT DETECTED
Benzodiazepines: NOT DETECTED
Cocaine: NOT DETECTED
Opiates: NOT DETECTED
Tetrahydrocannabinol: NOT DETECTED

## 2023-07-10 LAB — CBC
HCT: 37.8 % — ABNORMAL LOW (ref 39.0–52.0)
Hemoglobin: 13.2 g/dL (ref 13.0–17.0)
MCH: 31.5 pg (ref 26.0–34.0)
MCHC: 34.9 g/dL (ref 30.0–36.0)
MCV: 90.2 fL (ref 80.0–100.0)
Platelets: 173 10*3/uL (ref 150–400)
RBC: 4.19 MIL/uL — ABNORMAL LOW (ref 4.22–5.81)
RDW: 13.9 % (ref 11.5–15.5)
WBC: 7.2 10*3/uL (ref 4.0–10.5)
nRBC: 0 % (ref 0.0–0.2)

## 2023-07-10 LAB — COMPREHENSIVE METABOLIC PANEL WITH GFR
ALT: 13 U/L (ref 0–44)
AST: 23 U/L (ref 15–41)
Albumin: 4.2 g/dL (ref 3.5–5.0)
Alkaline Phosphatase: 86 U/L (ref 38–126)
Anion gap: 12 (ref 5–15)
BUN: 11 mg/dL (ref 8–23)
CO2: 21 mmol/L — ABNORMAL LOW (ref 22–32)
Calcium: 9.1 mg/dL (ref 8.9–10.3)
Chloride: 103 mmol/L (ref 98–111)
Creatinine, Ser: 1.44 mg/dL — ABNORMAL HIGH (ref 0.61–1.24)
GFR, Estimated: 52 mL/min — ABNORMAL LOW (ref >60)
Glucose, Bld: 142 mg/dL — ABNORMAL HIGH (ref 70–99)
Potassium: 3.3 mmol/L — ABNORMAL LOW (ref 3.5–5.1)
Sodium: 136 mmol/L (ref 135–145)
Total Bilirubin: 1.3 mg/dL — ABNORMAL HIGH (ref 0.0–1.2)
Total Protein: 7.3 g/dL (ref 6.5–8.1)

## 2023-07-10 LAB — I-STAT CHEM 8, ED
BUN: 9 mg/dL (ref 8–23)
Calcium, Ion: 1.12 mmol/L — ABNORMAL LOW (ref 1.15–1.40)
Chloride: 105 mmol/L (ref 98–111)
Creatinine, Ser: 1.5 mg/dL — ABNORMAL HIGH (ref 0.61–1.24)
Glucose, Bld: 142 mg/dL — ABNORMAL HIGH (ref 70–99)
HCT: 38 % — ABNORMAL LOW (ref 39.0–52.0)
Hemoglobin: 12.9 g/dL — ABNORMAL LOW (ref 13.0–17.0)
Potassium: 3.4 mmol/L — ABNORMAL LOW (ref 3.5–5.1)
Sodium: 139 mmol/L (ref 135–145)
TCO2: 19 mmol/L — ABNORMAL LOW (ref 22–32)

## 2023-07-10 LAB — DIFFERENTIAL
Abs Immature Granulocytes: 0.01 10*3/uL (ref 0.00–0.07)
Basophils Absolute: 0 10*3/uL (ref 0.0–0.1)
Basophils Relative: 0 %
Eosinophils Absolute: 0 10*3/uL (ref 0.0–0.5)
Eosinophils Relative: 0 %
Immature Granulocytes: 0 %
Lymphocytes Relative: 32 %
Lymphs Abs: 2.3 10*3/uL (ref 0.7–4.0)
Monocytes Absolute: 0.3 10*3/uL (ref 0.1–1.0)
Monocytes Relative: 4 %
Neutro Abs: 4.6 10*3/uL (ref 1.7–7.7)
Neutrophils Relative %: 64 %

## 2023-07-10 LAB — ETHANOL: Alcohol, Ethyl (B): <15 mg/dL (ref <15)

## 2023-07-10 LAB — PROTIME-INR
INR: 1.1 (ref 0.8–1.2)
Prothrombin Time: 14.5 s (ref 11.4–15.2)

## 2023-07-10 LAB — CBG MONITORING, ED: Glucose-Capillary: 140 mg/dL — ABNORMAL HIGH (ref 70–99)

## 2023-07-10 LAB — MRSA NEXT GEN BY PCR, NASAL: MRSA by PCR Next Gen: NOT DETECTED

## 2023-07-10 LAB — APTT: aPTT: 29 s (ref 24–36)

## 2023-07-10 MED ORDER — IOHEXOL 350 MG/ML SOLN
75.0000 mL | Freq: Once | INTRAVENOUS | Status: AC | PRN
  Administered 2023-07-10: 75 mL via INTRAVENOUS

## 2023-07-10 MED ORDER — ORAL CARE MOUTH RINSE
15.0000 mL | OROMUCOSAL | Status: DC | PRN
Start: 2023-07-10 — End: 2023-07-12

## 2023-07-10 MED ORDER — CHLORHEXIDINE GLUCONATE CLOTH 2 % EX PADS
6.0000 | MEDICATED_PAD | Freq: Every day | CUTANEOUS | Status: DC
  Administered 2023-07-10 – 2023-07-11 (×2): 6 via TOPICAL

## 2023-07-10 MED ORDER — LABETALOL HCL 5 MG/ML IV SOLN: 10.0000 mg | INTRAVENOUS | Status: DC | PRN

## 2023-07-10 MED ORDER — CLEVIDIPINE BUTYRATE 0.5 MG/ML IV EMUL: 0.0000 mg/h | INTRAVENOUS | Status: DC

## 2023-07-10 MED ORDER — TENECTEPLASE FOR STROKE
0.2500 mg/kg | PACK | Freq: Once | INTRAVENOUS | Status: AC
  Administered 2023-07-10: 17 mg via INTRAVENOUS

## 2023-07-10 MED ORDER — ENSURE PLUS HIGH PROTEIN PO LIQD
237.0000 mL | Freq: Two times a day (BID) | ORAL | Status: DC
  Administered 2023-07-11 (×2): 237 mL via ORAL

## 2023-07-10 NOTE — Progress Notes (Signed)
 Telestroke Note  1206: Code stroke activation received via telephone call made by nursing. Code stroke cart needs to be rebooted at this time. Per nursing, patient presents to the ED with complaints of sudden onset left arm weakness and numbness. Patient endorses losing function of the left arm after walking to his truck. LKW 1126. Nursing also notes left leg weakness. mRS 0. Per nursing, CT imaging already obtained by EDP.   1210: Telestroke cart now working. Telestroke nurse logged onto cart 1 at this time.   1211: Dr.Arora paged.   1212: Dr.Arora on camera. Patient history and report provided to Dr.Arora on camera at this time.   1214: Dr. Bonnita Buttner speaking with patient. NIHSS exam started by Dr.Arora.   1217: NIHSS exam completed by Dr.Arora.   1218: Dr.Arora discussing TNK administration with patient.   1219: TNK decision made. Patient wishes to proceed with TNK administration. Patient to receive TNK per Dr.Arora.   1223: Dr. Bonnita Buttner ordering advanced imaging to be obtained after TNK administration. Dr. Bonnita Buttner also speaking to patients wife at this time regarding plan of care and transfer to [facility].   1233: TNK administered.   1318: No further needs from telestroke nurse per Dr.Arora. Logged off stroke cart at this time.   1323: Dr.Arora paged.   1325: CTA imaging results provided to Dr.Arora.   Belvie Boyers Telestroke RN

## 2023-07-10 NOTE — Consult Note (Signed)
 [facility] Telemedicine Consult   Requesting Provider: Dr. Isaiah Marc Consult Participants: Dr. Anastasia Balo, Telespecialist RNGlenda   Bedside RN [facility] Location of the [facility] Location of the patient: [facility]-Emergency Department bed 2   This consult was provided via telemedicine with 2-way video and audio communication. The patient/family was informed that care would be provided in this way and agreed to receive care in this manner.   Chief Complaint: Left-sided numbness  HPI: 73 year old with dyslipidemia, hypertension, CAD, presenting for evaluation of sudden onset of left arm numbness and weakness. His last known well was right around 11:25 AM and it was right around 11:26 AM that he noticed numbness and weakness in the left arm as he was trying to get something from his truck. Denies any chest pain shortness of breath.  Never had stroke symptoms before Has had heart disease with a heart attack in the past. No recent surgeries.  No bleeding or clotting issues that he is aware of.   Past Medical History:  Diagnosis Date   Bradycardia 03/18/2012   Coronary artery disease    Dyslipidemia    Family history of heart disease    History of nuclear stress test 07/13/2008   dipyridamole; normal perfusion, no ischemia, low risk    Hypertension    NSTEMI (non-ST elevated myocardial infarction) (HCC)    h/o   Tobacco abuse      Current Facility-Administered Medications:    tenecteplase  (TNKASE ) injection for Stroke 0.25 mg/kg, 0.25 mg/kg, Intravenous, Once, Tona Francis, MD  Current Outpatient Medications:    acetaminophen  (TYLENOL ) 500 MG tablet, Take 500 mg by mouth every 6 (six) hours as needed for moderate pain., Disp: , Rfl:    albuterol  (VENTOLIN  HFA) 108 (90 Base) MCG/ACT inhaler, Inhale 2 puffs into the lungs every 4 (four) hours as needed for wheezing or shortness of breath., Disp: , Rfl:    amLODipine  (NORVASC ) 5 MG tablet, Take 5 mg by mouth daily.,  Disp: , Rfl:    amoxicillin -clavulanate (AUGMENTIN ) 875-125 MG tablet, Take 1 tablet by mouth daily. Take the morning of procedure on 03/06/23, Disp: 1 tablet, Rfl: 0   aspirin  EC 81 MG tablet, Take 81 mg by mouth daily., Disp: , Rfl:    buPROPion  (WELLBUTRIN  SR) 150 MG 12 hr tablet, Take 1 tablet (150 mg total) by mouth 2 (two) times daily. Take 150 mg once daily for 3 days, after this Take 150 mg Twice daily for 8 weeks (For Smoking Cessation), Disp: 120 tablet, Rfl: 0   clopidogrel  (PLAVIX ) 75 MG tablet, Take 1 tablet (75 mg total) by mouth daily., Disp: 90 tablet, Rfl: 3   doxycycline  (VIBRAMYCIN ) 100 MG capsule, Take 1 capsule (100 mg total) by mouth 2 (two) times daily., Disp: 56 capsule, Rfl: 0   gabapentin  (NEURONTIN ) 300 MG capsule, Take 300 mg by mouth 3 (three) times daily as needed (pain)., Disp: , Rfl:    loratadine  (CLARITIN ) 10 MG tablet, Take 10 mg by mouth daily., Disp: , Rfl:    nitroGLYCERIN  (NITROSTAT ) 0.4 MG SL tablet, Place 1 tablet (0.4 mg total) under the tongue every 5 (five) minutes as needed for chest pain (up to 3 doses)., Disp: 25 tablet, Rfl: 3   rosuvastatin  (CRESTOR ) 20 MG tablet, Take 1 tablet (20 mg total) by mouth daily., Disp: 90 tablet, Rfl: 3   tamsulosin  (FLOMAX ) 0.4 MG CAPS capsule, Take 1 capsule (0.4 mg total) by mouth in the morning and at bedtime., Disp: 60 capsule, Rfl: 11  LKW: 11:25 AM IV thrombolysis given?:  Yes-ordered at 12:21 PM IR Thrombectomy? No, no elvo Modified Rankin Scale: 0-Completely asymptomatic and back to baseline post- stroke Time of teleneurologist evaluation: 12:12 PM  Exam: Vitals:   07/10/23 1204  BP: (!) 155/73  Pulse: 77  Resp: 20  Temp: 97.8 F (36.6 C)  SpO2: 100%    General: Awake alert in no distress Neurological exam Awake alert oriented x 3.  No dysarthria.  No aphasia Cranial nerve examination reveals equal pupils round reactive light, extraocular movements intact, visual fields full, face grossly  symmetric, tongue and palate midline.  Diminished sensation on left face Motor examination with mild left lower extremity drift.  Left upper extremity appears weaker but does not exhibit vertical drift. Sensation diminished on left hemibody without extinction No gross dysmetria   NIHSS 1A: Level of Consciousness - 0 1B: Ask Month and Age - 0 1C: 'Blink Eyes' & 'Squeeze Hands' - 0 2: Test Horizontal Extraocular Movements - 0 3: Test Visual Fields - 0 4: Test Facial Palsy - 0 5A: Test Left Arm Motor Drift - 0 5B: Test Right Arm Motor Drift - 0 6A: Test Left Leg Motor Drift - 1 6B: Test Right Leg Motor Drift - 0 7: Test Limb Ataxia - 0 8: Test Sensation - 2 9: Test Language/Aphasia- 0 10: Test Dysarthria - 0 11: Test Extinction/Inattention - 0 NIHSS score: 3   Imaging Reviewed: CT head.  Aspects 10.  Labs reviewed in epic and pertinent values follow: CBC    Component Value Date/Time   WBC 7.2 07/10/2023 1201   RBC 4.19 (L) 07/10/2023 1201   HGB 13.2 07/10/2023 1201   HGB 11.8 (L) 09/11/2022 0942   HCT 37.8 (L) 07/10/2023 1201   HCT 35.4 (L) 09/11/2022 0942   PLT 173 07/10/2023 1201   PLT 219 09/11/2022 0942   MCV 90.2 07/10/2023 1201   MCV 89 09/11/2022 0942   MCH 31.5 07/10/2023 1201   MCHC 34.9 07/10/2023 1201   RDW 13.9 07/10/2023 1201   RDW 14.5 09/11/2022 0942   LYMPHSABS 2.3 07/10/2023 1201   MONOABS 0.3 07/10/2023 1201   EOSABS 0.0 07/10/2023 1201   BASOSABS 0.0 07/10/2023 1201   CMP     Component Value Date/Time   NA 138 04/06/2023 0852   NA 143 01/07/2023 0926   K 4.8 04/06/2023 0852   CL 105 04/06/2023 0852   CO2 25 04/06/2023 0852   GLUCOSE 110 (H) 04/06/2023 0852   BUN 13 04/06/2023 0852   BUN 9 01/07/2023 0926   CREATININE 1.37 (H) 04/06/2023 0852   CREATININE 1.42 (H) 09/14/2014 0823   CALCIUM  9.3 04/06/2023 0852   PROT 6.6 09/11/2022 0942   ALBUMIN 4.2 09/11/2022 0942   AST 22 09/11/2022 0942   ALT 14 09/11/2022 0942   ALKPHOS 136 (H)  09/11/2022 0942   BILITOT 0.6 09/11/2022 0942   GFRNONAA 55 (L) 04/06/2023 0852   GFRAA 63 10/08/2016 0941     Assessment: 73 year old man with cerebrovascular risk factors presenting for sudden onset of left-sided numbness and weakness-within the window for IV TNKase. Risks benefits alternatives discussed with the patient and wife.  Patient agreed to proceed CT imaging reviewed prior to TNK administration Likely small vessel etiology infarct versus cortical embolic infarct.  Impression: Acute ischemic stroke, receiving IV TNKase at [facility] ER  Recommendations:  Transfer to [facility] for post TNK care-Dr. Alecia Ames will be accepting physician Post TNK vitals and neurochecks  No antiplatelets or anticoagulants for 24 hours until 24-hour post TNK imaging is clear of any evidence of bleeding.  SCD for DVT prophylaxis. Stroke risk factor workup to include 2D echo, A1c, lipid panel. Stat CTA head and neck ordered. Therapy assessments MRI brain at 24 hours Replete electrolytes as necessary Blood pressure parameters post TNK-180/105 or below.  Avoid hypotension systolic below 120. Bolus control team to follow on arrival to [facility]. Discussed with the ED provider Dr. Hiawatha Lout via phone in secure chat. Discussed with Arlin Benes neurologist via secure chat   Addendum-1:25 PM CTA head and neck completed.  Agree with the impression below.  No new recommendations. IMPRESSION: 1. Negative for large vessel occlusion. 2. 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 Neuro endovascular consultation is suggested to evaluate the appropriateness of potential treatment. 3. No significant extracranial atherosclerosis or stenosis. Intracranial atherosclerosis primarily limited to the ICA siphons where there  is up to moderate bilateral siphon stenosis due to calcified plaque. 4. Aortic Atherosclerosis (ICD10-I70.0). 5. Emphysema (ICD10-J43.9).  -- Tona Francis, MD Neurologist [facility] Pager: [phone]   CRITICAL CARE ATTESTATION Performed by: Tona Francis, MD Total critical care time: 45 minutes Critical care time was exclusive of separately billable procedures and treating other patients and/or supervising APPs/Residents/Students Critical care was necessary to treat or prevent imminent or life-threatening deterioration. This patient is critically ill and at significant risk for neurological worsening and/or death and care requires constant monitoring. Critical care was time spent personally by me on the following activities: development of treatment plan with patient and/or surrogate as well as nursing, discussions with consultants, evaluation of patient's response to treatment, examination of patient, obtaining history from patient or surrogate, ordering and performing treatments and interventions, ordering and review of laboratory studies, ordering and review of radiographic studies, pulse oximetry, re-evaluation of patient's condition, participation in multidisciplinary rounds and medical decision making of high complexity in the care of this patient.

## 2023-07-10 NOTE — H&P (Signed)
 NEUROLOGY H&P NOTE   Date of service: Jul 10, 2023 Patient Name: Robert Shaw MRN:  [id number] DOB:  14-Sep-1950 Chief Complaint: "CODE STROKE"  History of Present Illness  Robert Shaw is a 73 y.o. male with hx of dyslipidemia, HTN, CAD who presented to [facility] emergency department 5/2 8 due to acute onset of left arm numbness, weakness, discoordination.  Patient described working and in his usual state of health then suddenly his left arm began to shake he was unable to "make it do it and wanted it to do", he had numbness and weakness in the left arm.  He had no symptoms in his left leg.  He denied headache, vision changes at the time.   Patient was evaluated by teleneurology Dr. Arora.  CT head negative. NIH: 3. After discussion, patient agreed to proceed with TNK administration.   TNK given at 1233. Patient was then transferred to [facility], ICU for post TNK monitoring.  Admission assessment [facility] ICU NIHSS components Score: Comment  1a Level of Conscious 0[x]  1[]  2[]  3[]      1b LOC Questions 0[x]  1[]  2[]       1c LOC Commands 0[x]  1[]  2[]       2 Best Gaze 0[x]  1[]  2[]       3 Visual 0[x]  1[]  2[]  3[]      4 Facial Palsy 0[x]  1[]  2[]  3[]      5a Motor Arm - left 0[x]  1[]  2[]  3[]  4[]  UN[]    5b Motor Arm - Right 0[x]  1[]  2[]  3[]  4[]  UN[]    6a Motor Leg - Left 0[x]  1[]  2[]  3[]  4[]  UN[]    6b Motor Leg - Right 0[x]  1[]  2[]  3[]  4[]  UN[]    7 Limb Ataxia 0[x]  1[]  2[]  UN[]      8 Sensory 0[x]  1[]  2[]  UN[]      9 Best Language 0[x]  1[]  2[]  3[]      10 Dysarthria 0[x]  1[]  2[]  UN[]      11 Extinct. and Inattention 0[x]  1[]  2[]       TOTAL:   0      ROS  Comprehensive ROS performed and pertinent positives documented in the HPI  Past History   Past Medical History:  Diagnosis Date   Bradycardia 03/18/2012   Coronary artery disease    Dyslipidemia    Family history of heart disease    History of nuclear stress test 07/13/2008   dipyridamole; normal perfusion, no ischemia, low risk     Hypertension    NSTEMI (non-ST elevated myocardial infarction) (HCC)    h/o   Tobacco abuse    Past Surgical History:  Procedure Laterality Date   CARDIAC CATHETERIZATION  04/2001   after high lateral MI, preserved LV function w/mild mid anterolateral hypokinesia, diffuse coronary irregularity with signficiant obstruction in LAD, OM1, superior branch of ramus (Dr. Adriana Hopping - [facility])   CARDIAC CATHETERIZATION  09/23/2001   patent stents from 06/2001 (Dr. Jammie Mccune)   CARDIAC CATHETERIZATION  09/17/2002   no restenosis at prior intervention sites (Dr. Electa Grieve)   CARDIAC CATHETERIZATION  09/09/2009   no significant CAD, patent stents in OM1 & LAD (Dr. Electa Grieve)    COLONOSCOPY N/A 02/25/2017   Procedure: COLONOSCOPY;  Surgeon: Alyce Jubilee, MD;  Location: [facility] ENDO [facility];  Service: Endoscopy;  Laterality: N/A;  10:30 Am   COLONOSCOPY WITH PROPOFOL  N/A 04/15/2020   Procedure: COLONOSCOPY WITH PROPOFOL ;  Surgeon: Vinetta Greening, DO;  Location: [facility] END[facility];  Service:  Endoscopy;  Laterality: N/A;  pm ASA II/   CORONARY ANGIOPLASTY WITH STENT PLACEMENT  06/19/2001   Cfx (3.5x43mm Zeta stent) & diagonal stenting (2.5x32mm pixel stent) w/moderate LAD disease (Dr. Electa Grieve)    CORONARY ANGIOPLASTY WITH STENT PLACEMENT  03/17/2012   Xience DES (2.25x62mm) of mid-distal LAD (Dr. Electa Grieve)   CORONARY STENT INTERVENTION N/A 06/26/2022   Procedure: CORONARY STENT INTERVENTION;  Surgeon: Swaziland, Peter M, MD;  Location: [facility] INVASIVE CV LAB;  Service: Cardiovascular;  Laterality: N/A;   LEFT HEART CATH AND CORONARY ANGIOGRAPHY N/A 06/26/2022   Procedure: LEFT HEART CATH AND CORONARY ANGIOGRAPHY;  Surgeon: Swaziland, Peter M, MD;  Location: [facility] INVASIVE CV LAB;  Service: Cardiovascular;  Laterality: N/A;   LEFT HEART CATHETERIZATION WITH CORONARY ANGIOGRAM N/A 03/17/2012   Procedure: LEFT HEART CATHETERIZATION WITH CORONARY ANGIOGRAM;  Surgeon: Hazle Lites, MD;  Location: [facility] CATH  LAB;  Service: Cardiovascular;  Laterality: N/A;   PERCUTANEOUS CORONARY STENT INTERVENTION (PCI-S)  03/17/2012   Procedure: PERCUTANEOUS CORONARY STENT INTERVENTION (PCI-S);  Surgeon: Hazle Lites, MD;  Location: [facility] CATH LAB;  Service: Cardiovascular;;   POLYPECTOMY  02/25/2017   Procedure: POLYPECTOMY;  Surgeon: Alyce Jubilee, MD;  [facility];  Service: Endoscopy;;  cecal x2; ascending x4;hepatic flexure;splenic flexure; descending x2;   POLYPECTOMY  04/15/2020   Procedure: POLYPECTOMY;  Surgeon: Vinetta Greening, DO;  Location[facility];  Service: Endoscopy;;  cecal    TRANSTHORACIC ECHOCARDIOGRAM  03/29/2006   EF normal; mild MR & TR; AV mildly sclerotic   Family History  Problem Relation Age of Onset   Heart disease Brother        x3   Stroke Father    Social History   Socioeconomic History   Marital status: Married    Spouse name: Not on file   Number of children: 1   Years of education: Not on file   Highest education level: Not on file  Occupational History   Not on file  Tobacco Use   Smoking status: Every Day    Current packs/day: 0.10    Average packs/day: 0.1 packs/day for 40.0 years (4.0 ttl pk-yrs)    Types: Cigarettes   Smokeless tobacco: Never   Tobacco comments:    smokes 3-4 cigarettes daily ("it all depends") 10/27/13  Vaping Use   Vaping status: Never Used  Substance and Sexual Activity   Alcohol use: No   Drug use: No   Sexual activity: Not on file  Other Topics Concern   Not on file  Social History Narrative   Not on file   Social Drivers of Health   Financial Resource Strain: Not on file  Food Insecurity: Not on file  Transportation Needs: Not on file  Physical Activity: Not on file  Stress: Not on file  Social Connections: Not on file   No Known Allergies  Medications   Current Facility-Administered Medications:    [START ON 07/11/2023] Chlorhexidine Gluconate Cloth 2 % PADS 6 each, 6 each, Topical, Q0600,  Augustin Leber, MD, 6 each at 07/10/23 1558   clevidipine (CLEVIPREX) infusion 0.5 mg/mL, 0-21 mg/hr, Intravenous, Continuous, Lehner, Erin C, NP   labetalol (NORMODYNE) injection 10 mg, 10 mg, Intravenous, Q2H PRN, Lehner, Erin C, NP   Oral care mouth rinse, 15 mL, Mouth Rinse, PRN, Augustin Leber, MD   Vitals   Vitals:   07/10/23 1430 07/10/23 1445 07/10/23 1500 07/10/23 1600  BP: 133/77 130/75 (!) 147/81 Aaron Aas)  155/75  Pulse: (!) 55 (!) 56 65 61  Resp: 16 (!) 25 16 19   Temp:    97.9 F (36.6 C)  TempSrc:    Oral  SpO2: 99% 98% 100% 99%  Weight:         Body mass index is 20.6 kg/m.  Physical Exam   Constitutional: Appears well-developed and well-nourished.  Psych: Affect appropriate to situation. Calm and cooperative Cardiovascular: Normal rate and regular rhythm.  Respiratory: Effort normal, non-labored breathing.   Neurologic Examination   Neuro: Mental Status: Patient is awake, alert, oriented to person, place, month, year, and situation. Patient is able to give a clear and coherent history. No signs of aphasia or neglect Cranial Nerves: II: Visual Fields are full. Pupils are equal, round, and reactive to light.   III,IV, VI: EOMI without ptosis or diploplia.  V: Facial sensation is symmetric to temperature VII: Facial movement is symmetric.  VIII: hearing is intact to voice X: Uvula elevates symmetrically XI: Shoulder shrug is symmetric. XII: tongue is midline without atrophy or fasciculations.  Motor: Tone is normal. Bulk is normal. 5/5 strength was present in all four extremities.  Sensory: Sensation is symmetric to light touch and temperature in the arms and legs. Cerebellar: FNF and HKS are intact bilaterally   Labs   CBC:  Recent Labs  Lab 07/10/23 1201 07/10/23 1209  WBC 7.2  --   NEUTROABS 4.6  --   HGB 13.2 12.9*  HCT 37.8* 38.0*  MCV 90.2  --   PLT 173  --    Basic Metabolic Panel:  Lab Results  Component Value Date    NA 139 07/10/2023   K 3.4 (L) 07/10/2023   CO2 21 (L) 07/10/2023   GLUCOSE 142 (H) 07/10/2023   BUN 9 07/10/2023   CREATININE 1.50 (H) 07/10/2023   CALCIUM  9.1 07/10/2023   GFRNONAA 52 (L) 07/10/2023   GFRAA 63 10/08/2016   Lipid Panel:  Lab Results  Component Value Date   LDLCALC 51 08/03/2022   HgbA1c:  Lab Results  Component Value Date   HGBA1C 5.9 (H) 06/26/2022   Urine Drug Screen:     Component Value Date/Time   LABOPIA NONE DETECTED 06/17/2007 1054   COCAINSCRNUR NONE DETECTED 06/17/2007 1054   LABBENZ NONE DETECTED 06/17/2007 1054   AMPHETMU NONE DETECTED 06/17/2007 1054   THCU NONE DETECTED 06/17/2007 1054   LABBARB  06/17/2007 1054    NONE DETECTED        DRUG SCREEN FOR MEDICAL PURPOSES ONLY.  IF CONFIRMATION IS NEEDED FOR ANY PURPOSE, NOTIFY LAB WITHIN 5 DAYS.    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 Risks benefits alternatives discussed with the patient and wife.  Patient agreed to  proceed CT imaging reviewed prior to TNK administration Likely small vessel etiology infarct versus cortical embolic infarct.  Primary Diagnosis:  Acute Ischemic Infarct s/p  TNK   Recommendations  - Frequent Neuro checks per stroke unit protocol - MRI Brain stroke protocol - TTE w/bubble study - Lipid panel - Statin - will be started if LDL>70 or otherwise medically indicated - A1C - Antithrombotic - Hold until 24 hour post-TNK imaging is complete and negative for bleed. Patient was on Aspirin  81mg  PTA.  - DVT ppx - SCDs until 24hour post-TNK imaging is complete and negstive for bleed.  - Smoking cessation - will counsel patient - BP goal - <180/105,  PRN labetalol if HR>60 and PRN Hydralazine  if HR<60 Cleviprex gtt if needed - Telemetry monitoring for arrhythmia - 72h - Swallow screen - will be performed prior to PO intake - Stroke education - will be given - PT/OT/SLP - Dispo: admit to ICU  _____________________________________________________________   Signed, Audrene Lease, NP [facility]  Risks, benefits and alternatives of IVT discussed with patient and/or family and they agreed. CTH personally reviewed prior to TNK administration   I have seen the patient reviewed the above note.  He presented with left-sided numbness and weakness.  He does not have any drift, but he does appear to have very mild weakness to confrontation of the left upper extremity.  He will need to be admitted for post TNK monitoring, secondary risk factor reduction, and therapy evaluations.  Ann Keto, MD [facility]   If 7pm- 7am, please page neurology on call as listed in AMION.

## 2023-07-10 NOTE — ED Notes (Signed)
 [facility] has been called for transport, talked to [place] from [facility].

## 2023-07-10 NOTE — ED Provider Notes (Signed)
 [place] EMERGENCY DEPARTMENT AT [facility] Provider Note  CSN: [id number] Arrival date & time: 07/10/23 1141  Chief Complaint(s) Code Stroke  HPI Robert Shaw is a 73 y.o. male history of coronary artery disease, CKD, hyperlipidemia, hypertension presenting to the emergency department with left arm weakness.  Patient reports sudden onset of left arm weakness and left leg weakness approximately 15 minutes prior to arrival, sometime around 1125 or so.  Reports tingling in his left arm and left leg as well.  No headaches, chest pain.   Past Medical History Past Medical History:  Diagnosis Date   Bradycardia 03/18/2012   Coronary artery disease    Dyslipidemia    Family history of heart disease    History of nuclear stress test 07/13/2008   dipyridamole; normal perfusion, no ischemia, low risk    Hypertension    NSTEMI (non-ST elevated myocardial infarction) (HCC)    h/o   Tobacco abuse    Patient Active Problem List   Diagnosis Date Noted   Acute ischemic stroke (HCC) 07/10/2023   QT prolongation 06/27/2022   Acute kidney injury superimposed on CKD (HCC) 06/27/2022   Essential hypertension 06/27/2022   Exophthalmos 06/27/2022   Unstable angina (HCC) 06/26/2022   Polyp of colon    Hyperlipidemia 12/30/2012   Tobacco dependence 12/30/2012   Bradycardia 03/18/2012   NSTEMI (non-ST elevated myocardial infarction) 03/14/2012   CAD (coronary artery disease) 03/14/2012   Home Medication(s) Prior to Admission medications   Medication Sig Start Date End Date Taking? Authorizing Provider  acetaminophen  (TYLENOL ) 500 MG tablet Take 500 mg by mouth every 6 (six) hours as needed for moderate pain.   Yes [provider]  albuterol  (VENTOLIN  HFA) 108 (90 Base) MCG/ACT inhaler Inhale 2 puffs into the lungs every 4 (four) hours as needed for wheezing or shortness of breath. 09/16/19  Yes [provider]  amLODipine  (NORVASC ) 5 MG tablet Take 5 mg by mouth daily.  09/27/21  Yes [provider]  aspirin  EC 81 MG tablet Take 81 mg by mouth daily.   Yes [provider]  doxycycline  (VIBRAMYCIN ) 100 MG capsule Take 1 capsule (100 mg total) by mouth 2 (two) times daily. 06/18/23  Yes McKenzie, Arden Beck, MD  loratadine  (CLARITIN ) 10 MG tablet Take 10 mg by mouth daily.   Yes [provider]  nitroGLYCERIN  (NITROSTAT ) 0.4 MG SL tablet Place 1 tablet (0.4 mg total) under the tongue every 5 (five) minutes as needed for chest pain (up to 3 doses). 06/27/22  Yes Dunn, Dayna N, PA-C  rosuvastatin  (CRESTOR ) 10 MG tablet Take 10 mg by mouth daily. 06/07/23  Yes [provider]  tadalafil (CIALIS) 20 MG tablet SMARTSIG:1 Tablet(s) By Mouth 06/03/23  Yes [provider]  tamsulosin  (FLOMAX ) 0.4 MG CAPS capsule Take 1 capsule (0.4 mg total) by mouth in the morning and at bedtime. 06/19/23  Yes McKenzie, Arden Beck, MD  rosuvastatin  (CRESTOR ) 20 MG tablet Take 1 tablet (20 mg total) by mouth daily. 12/13/22 03/13/23  Hazle Lites, MD  Past Surgical History Past Surgical History:  Procedure Laterality Date   CARDIAC CATHETERIZATION  04/2001   after high lateral MI, preserved LV function w/mild mid anterolateral hypokinesia, diffuse coronary irregularity with signficiant obstruction in LAD, OM1, superior branch of ramus (Dr. Adriana Hopping - [facility])   CARDIAC CATHETERIZATION  09/23/2001   patent stents from 06/2001 (Dr. Jammie Mccune)   CARDIAC CATHETERIZATION  09/17/2002   no restenosis at prior intervention sites (Dr. Electa Grieve)   CARDIAC CATHETERIZATION  09/09/2009   no significant CAD, patent stents in OM1 & LAD (Dr. Electa Grieve)    COLONOSCOPY N/A 02/25/2017   Procedure: COLONOSCOPY;  Surgeon: Alyce Jubilee, MD;  Location: [facility] ENDO [facility];  Service: Endoscopy;  Laterality: N/A;  10:30 Am    COLONOSCOPY WITH PROPOFOL  N/A 04/15/2020   Procedure: COLONOSCOPY WITH PROPOFOL ;  Surgeon: Vinetta Greening, DO;  Location: [facility] ENDO SUITE;  Service: Endoscopy;  Laterality: N/A;  pm ASA II/   CORONARY ANGIOPLASTY WITH STENT PLACEMENT  06/19/2001   Cfx (3.5x28mm Zeta stent) & diagonal stenting (2.5x18mm pixel stent) w/moderate LAD disease (Dr. Electa Grieve)    CORONARY ANGIOPLASTY WITH STENT PLACEMENT  03/17/2012   Xience DES (2.25x51mm) of mid-distal LAD (Dr. Electa Grieve)   CORONARY STENT INTERVENTION N/A 06/26/2022   Procedure: CORONARY STENT INTERVENTION;  Surgeon: Swaziland, Peter M, MD;  Location: [facility] INVASIVE CV LAB;  Service: Cardiovascular;  Laterality: N/A;   LEFT HEART CATH AND CORONARY ANGIOGRAPHY N/A 06/26/2022   Procedure: LEFT HEART CATH AND CORONARY ANGIOGRAPHY;  Surgeon: Swaziland, Peter M, MD;  Location: [facility] INVASIVE CV LAB;  Service: Cardiovascular;  Laterality: N/A;   LEFT HEART CATHETERIZATION WITH CORONARY ANGIOGRAM N/A 03/17/2012   Procedure: LEFT HEART CATHETERIZATION WITH CORONARY ANGIOGRAM;  Surgeon: Hazle Lites, MD;  Location: [facility] CATH LAB;  Service: Cardiovascular;  Laterality: N/A;   PERCUTANEOUS CORONARY STENT INTERVENTION (PCI-S)  03/17/2012   Procedure: PERCUTANEOUS CORONARY STENT INTERVENTION (PCI-S);  Surgeon: Hazle Lites, MD;  Location: [facility] CATH LAB;  Service: Cardiovascular;;   POLYPECTOMY  02/25/2017   Procedure: POLYPECTOMY;  Surgeon: Alyce Jubilee, MD;  Location: [facility];  Service: Endoscopy;;  cecal x2; ascending x4;hepatic flexure;splenic flexure; descending x2;   POLYPECTOMY  04/15/2020   Procedure: POLYPECTOMY;  Surgeon: Vinetta Greening, DO;  Location: [facility];  Service: Endoscopy;;  cecal    TRANSTHORACIC ECHOCARDIOGRAM  03/29/2006   EF normal; mild MR & TR; AV mildly sclerotic   Family History Family History  Problem Relation Age of Onset   Heart disease Brother        x3   Stroke Father     Social History Social History   Tobacco Use    Smoking status: Every Day    Current packs/day: 0.10    Average packs/day: 0.1 packs/day for 40.0 years (4.0 ttl pk-yrs)    Types: Cigarettes   Smokeless tobacco: Never   Tobacco comments:    smokes 3-4 cigarettes daily ("it all depends") 10/27/13  Vaping Use   Vaping status: Never Used  Substance Use Topics   Alcohol use: No   Drug use: No   Allergies Patient has no known allergies.  Review of Systems Review of Systems  All other systems reviewed and are negative.   Physical Exam Vital Signs  I have reviewed the triage vital signs BP 127/79   Pulse (!) 59   Temp 97.8 F (36.6 C)   Resp 14   Wt 68.9  kg   SpO2 99%   BMI 20.60 kg/m  Physical Exam Vitals and nursing note reviewed.  Constitutional:      General: He is not in acute distress.    Appearance: Normal appearance.  HENT:     Mouth/Throat:     Mouth: Mucous membranes are moist.  Eyes:     Conjunctiva/sclera: Conjunctivae normal.  Cardiovascular:     Rate and Rhythm: Normal rate and regular rhythm.  Pulmonary:     Effort: Pulmonary effort is normal. No respiratory distress.     Breath sounds: Normal breath sounds.  Abdominal:     General: Abdomen is flat.     Palpations: Abdomen is soft.     Tenderness: There is no abdominal tenderness.  Musculoskeletal:     Right lower leg: No edema.     Left lower leg: No edema.  Skin:    General: Skin is warm and dry.     Capillary Refill: Capillary refill takes less than 2 seconds.  Neurological:     Mental Status: He is alert.     Comments: Cranial nerves II through XII intact with his sensation possibly slightly diminished on the left face.  4+ out of 5 strength in the left upper extremity, 5 out of 5 strength in the right upper extremity.  4+ out of 5 strength in the left lower extremity, 5 out of 5 strength right lower extremity  Psychiatric:        Mood and Affect: Mood normal.        Behavior: Behavior normal.     ED Results and Treatments Labs (all  labs ordered are listed, but only abnormal results are displayed) Labs Reviewed  CBC - Abnormal; Notable for the following components:      Result Value   RBC 4.19 (*)    HCT 37.8 (*)    All other components within normal limits  COMPREHENSIVE METABOLIC PANEL WITH GFR - Abnormal; Notable for the following components:   Potassium 3.3 (*)    CO2 21 (*)    Glucose, Bld 142 (*)    Creatinine, Ser 1.44 (*)    Total Bilirubin 1.3 (*)    GFR, Estimated 52 (*)    All other components within normal limits  CBG MONITORING, ED - Abnormal; Notable for the following components:   Glucose-Capillary 140 (*)    All other components within normal limits  ETHANOL  PROTIME-INR  APTT  DIFFERENTIAL  RAPID URINE DRUG SCREEN, HOSP PERFORMED  I-STAT CHEM 8, ED                                                                                                                          Radiology CT ANGIO HEAD NECK W WO CM (CODE STROKE) Result Date: 07/10/2023 CLINICAL DATA:  73 year old male code stroke presentation. Left side weakness. EXAM: CT ANGIOGRAPHY HEAD AND NECK TECHNIQUE: Multidetector CT imaging of the head and neck was performed using the standard protocol during  bolus administration of intravenous contrast. Multiplanar CT image reconstructions and MIPs were obtained to evaluate the vascular anatomy. Carotid stenosis measurements (when applicable) are obtained utilizing NASCET criteria, using the distal internal carotid diameter as the denominator. RADIATION DOSE REDUCTION: This exam was performed according to the departmental dose-optimization program which includes automated exposure control, adjustment of the mA and/or kV according to patient size and/or use of iterative reconstruction technique. CONTRAST:  75mL OMNIPAQUE  IOHEXOL  350 MG/ML SOLN COMPARISON:  Plain head CT 1154 hours today. FINDINGS: CTA NECK Skeleton: Bulky Diffuse idiopathic skeletal hyperostosis (DISH). Large cervical anterior endplate  osteophytes throughout. Associated interbody ankylosis C5-C6 and C6-C7. No acute osseous abnormality identified. Upper chest: Emphysema and apical lung scarring. Other neck: Nonvascular neck soft tissue spaces appear within normal limits. Aortic arch: 3 vessel arch. Minimal arch atherosclerosis is visible. Right carotid system: Patent with no significant atherosclerosis in the neck. Moderately tortuous right ICA distal to the bulb. No stenosis. Left carotid system: Patent with mild calcified plaque at the lateral left ICA origin. No stenosis. Moderate left ICA tortuosity just below the skull base. Vertebral arteries: Mild proximal right subclavian artery plaque without stenosis. Normal right vertebral artery origin. Tortuous right V1 segment. Mildly tortuous right vertebral artery otherwise to the skull base with no plaque or stenosis. Proximal left subclavian artery minimal plaque and no stenosis. Normal left vertebral artery origin. Non dominant left vertebral artery is patent to the skull base with no significant plaque or stenosis. CTA HEAD Posterior circulation: Patent distal vertebral arteries and vertebrobasilar junction. Calcified V4 segment plaque bilaterally but no stenosis. Dominant right V4. Patent basilar artery without stenosis. Patent dominant appearing AICAs, patent SCA and PCA origins. Posterior communicating arteries are diminutive or absent. Bilateral PCA branches are patent with mild tortuosity, no stenosis. Anterior circulation: Bilateral ICA siphons are patent. Left siphon moderate calcified plaque. Moderate supraclinoid left ICA stenosis on series 5, image 253. Contralateral right siphon similar extensive cavernous through anterior genu segment plaque with moderate stenosis on series 5, image 265. Patent carotid termini. MCA and ACA origins appear normal. Tortuous A1 segments. Diminutive or absent anterior communicating artery. Bilateral ACA branches are within normal limits. Left MCA M1  segment bifurcates early without stenosis. Right MCA M1 segment bifurcates without stenosis. Right MCA branches are within normal limits. Left MCA branches appear only mildly hypertrophied, but there is extensive asymmetric early venous enhancement along the periphery of the left hemisphere with tortuous and enlarged draining cortical veins (series 5 image 206). These both tract toward the superior sagittal sinus and drain into the left sphenoid 0 parietal sinus. There is no early cavernous sinus enhancement. And the finding is somewhat diffuse, lacking a discrete nidus although the left sylvian fissure may be the epicenter. Similarly, the left ICA siphon nor the left MCA branch caliber seem enlarged. No arterial aneurysm is identified. Venous sinuses: Patent with abnormal left hemisphere venous structures as above. Anatomic variants: Dominant right vertebral artery. Review of the MIP images confirms the above findings IMPRESSION: 1. Negative for large vessel occlusion. 2. 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 Neuro endovascular consultation is suggested to evaluate the appropriateness of potential treatment. 3. No significant extracranial atherosclerosis or stenosis. Intracranial atherosclerosis primarily limited to the ICA siphons where there is up to moderate bilateral siphon stenosis due to calcified plaque. 4.  Aortic  Atherosclerosis (ICD10-I70.0). 5.  Emphysema (ICD10-J43.9). Electronically Signed   By: Marlise Simpers M.D.   On: 07/10/2023 13:13   CT HEAD CODE STROKE WO CONTRAST Addendum Date: 07/10/2023 ADDENDUM REPORT: 07/10/2023 12:24 ADDENDUM: Study discussed by telephone with Dr. Roxy Cordial on 07/10/2023 at 1213 hours. Electronically Signed   By: Marlise Simpers M.D.   On: 07/10/2023 12:24   Result Date: 07/10/2023 CLINICAL DATA:  Code stroke. 73 year old  male with acute left side deficit. EXAM: CT HEAD WITHOUT CONTRAST TECHNIQUE: Contiguous axial images were obtained from the base of the skull through the vertex without intravenous contrast. RADIATION DOSE REDUCTION: This exam was performed according to the departmental dose-optimization program which includes automated exposure control, adjustment of the mA and/or kV according to patient size and/or use of iterative reconstruction technique. COMPARISON:  Head CT 12/16/2014. FINDINGS: Brain: No midline shift, ventriculomegaly, mass effect, evidence of mass lesion, intracranial hemorrhage or evidence of cortically based acute infarction. Chronic appearing cortical encephalomalacia in the posterosuperior left frontal gyrus (was present in 2016) and also the left superior parietal lobe (new since that time series 2, image 28). Chronic cortical encephalomalacia in the lateral left occipital pole was present in 2016. Patchy and confluent additional bilateral cerebral white matter hypodensity. Vascular: Calcified atherosclerosis at the skull base. No suspicious intracranial vascular hyperdensity. Skull: Stable, intact. Sinuses/Orbits: Visualized paranasal sinuses and mastoids are clear. Other: No gaze deviation. Visualized orbits and scalp soft tissues are within normal limits. ASPECTS [facility] Stroke Program Early CT Score) Total score (0-10 with 10 being normal): Ten, chronic encephalomalacia. IMPRESSION: 1. No acute cortically based infarct or acute intracranial hemorrhage identified. ASPECTS 10. 2. Moderate for age white matter disease. And chronic appearing cortical infarcts in the left frontal, parietal, and occipital lobes - most present in 2016. Electronically Signed: By: Marlise Simpers M.D. On: 07/10/2023 12:02    Pertinent labs & imaging results that were available during my care of the patient were reviewed by me and considered in my medical decision making (see MDM for details).  Medications Ordered in  ED Medications  tenecteplase (TNKASE) injection for Stroke 17 mg (17 mg Intravenous Given 07/10/23 1233)  iohexol  (OMNIPAQUE ) 350 MG/ML injection 75 mL (75 mLs Intravenous Contrast Given 07/10/23 1251)                                                                                                                                     Procedures .Critical Care  Performed by: Mordecai Applebaum, MD Authorized by: Mordecai Applebaum, MD   Critical care provider statement:    Critical care time (minutes):  30   Critical care was necessary to treat or prevent imminent or life-threatening deterioration of the following conditions:  CNS failure or compromise   Critical care was time spent personally by me on the following activities:  Development of treatment plan with patient or surrogate, discussions with consultants, evaluation of  patient's response to treatment, examination of patient, ordering and review of laboratory studies, ordering and review of radiographic studies, ordering and performing treatments and interventions, pulse oximetry, re-evaluation of patient's condition and review of old charts   Care discussed with: admitting provider     (including critical care time)  Medical Decision Making / ED Course   MDM:  73 year old presenting to the emergency department with left-sided weakness.  Patient overall well-appearing, vitals reassuring, mild hypertension.  Exam with focal left-sided weakness.  Given acute onset neurology was consulted to be a code stroke.  Patient was taken to CT scanner and no bleeding was noted.  Neurology Dr. Evalee Hila felt symptoms were consistent with acute stroke and ordered TNK which patient received.  Patient will be admitted to the neuro ICU at [facility].  Considered other causes of weakness such as dissection, patient denies any chest or abdominal pain, back pain to suggest this.  Considered other causes of weakness such as intracranial bleeding however CT scan  without sign of bleeding.  Considered large vessel occlusion, CTA was performed without signs of LVO.      Additional history obtained: -Additional history obtained from spouse -External records from outside source obtained and reviewed including: Chart review including previous notes, labs, imaging, consultation notes including prior notes    Lab Tests: -I ordered, reviewed, and interpreted labs.   The pertinent results include:   Labs Reviewed  CBC - Abnormal; Notable for the following components:      Result Value   RBC 4.19 (*)    HCT 37.8 (*)    All other components within normal limits  COMPREHENSIVE METABOLIC PANEL WITH GFR - Abnormal; Notable for the following components:   Potassium 3.3 (*)    CO2 21 (*)    Glucose, Bld 142 (*)    Creatinine, Ser 1.44 (*)    Total Bilirubin 1.3 (*)    GFR, Estimated 52 (*)    All other components within normal limits  CBG MONITORING, ED - Abnormal; Notable for the following components:   Glucose-Capillary 140 (*)    All other components within normal limits  ETHANOL  PROTIME-INR  APTT  DIFFERENTIAL  RAPID URINE DRUG SCREEN, HOSP PERFORMED  I-STAT CHEM 8, ED    Notable for CKD  EKG   EKG Interpretation Date/Time:    Ventricular Rate:    PR Interval:    QRS Duration:    QT Interval:    QTC Calculation:   R Axis:      Text Interpretation:           Imaging Studies ordered: I ordered imaging studies including CT head  On my interpretation imaging demonstrates no acute process I independently visualized and interpreted imaging. I agree with the radiologist interpretation   Medicines ordered and prescription drug management: Meds ordered this encounter  Medications   tenecteplase  (TNKASE ) injection for Stroke 17 mg   iohexol  (OMNIPAQUE ) 350 MG/ML injection 75 mL    -I have reviewed the patients home medicines and have made adjustments as needed   Consultations Obtained: I requested consultation with the  neurologist,  and discussed lab and imaging findings as well as pertinent plan - they recommend: TNK  Co morbidities that complicate the patient evaluation  Past Medical History:  Diagnosis Date   Bradycardia 03/18/2012   Coronary artery disease    Dyslipidemia    Family history of heart disease    History of nuclear stress test 07/13/2008   dipyridamole; normal perfusion,  no ischemia, low risk    Hypertension    NSTEMI (non-ST elevated myocardial infarction) (HCC)    h/o   Tobacco abuse       Dispostion: Disposition decision including need for hospitalization was considered, and patient admitted to the hospital.    Final Clinical Impression(s) / ED Diagnoses Final diagnoses:  Acute ischemic stroke [facility])     This chart was dictated using voice recognition software.  Despite best efforts to proofread,  errors can occur which can change the documentation meaning.    Mordecai Applebaum, MD 07/10/23 1352

## 2023-07-11 ENCOUNTER — Encounter (HOSPITAL_COMMUNITY): Payer: Self-pay | Admitting: Cardiovascular Disease

## 2023-07-11 ENCOUNTER — Inpatient Hospital Stay (HOSPITAL_COMMUNITY)

## 2023-07-11 ENCOUNTER — Encounter (HOSPITAL_COMMUNITY)
Admission: EM | Disposition: A | Discharge status: Home / Self Care | Payer: Self-pay | Source: Home / Self Care | Attending: Neurology

## 2023-07-11 DIAGNOSIS — I639 Cerebral infarction, unspecified: Secondary | ICD-10-CM

## 2023-07-11 DIAGNOSIS — I6389 Other cerebral infarction: Secondary | ICD-10-CM | POA: Diagnosis not present

## 2023-07-11 HISTORY — PX: LOOP RECORDER INSERTION: EP1214

## 2023-07-11 LAB — BASIC METABOLIC PANEL WITH GFR
Anion gap: 6 (ref 5–15)
BUN: 7 mg/dL — ABNORMAL LOW (ref 8–23)
CO2: 23 mmol/L (ref 22–32)
Calcium: 8.4 mg/dL — ABNORMAL LOW (ref 8.9–10.3)
Chloride: 109 mmol/L (ref 98–111)
Creatinine, Ser: 1.4 mg/dL — ABNORMAL HIGH (ref 0.61–1.24)
GFR, Estimated: 54 mL/min — ABNORMAL LOW (ref >60)
Glucose, Bld: 105 mg/dL — ABNORMAL HIGH (ref 70–99)
Potassium: 3.7 mmol/L (ref 3.5–5.1)
Sodium: 138 mmol/L (ref 135–145)

## 2023-07-11 LAB — CBC
HCT: 34.7 % — ABNORMAL LOW (ref 39.0–52.0)
Hemoglobin: 12.1 g/dL — ABNORMAL LOW (ref 13.0–17.0)
MCH: 31.1 pg (ref 26.0–34.0)
MCHC: 34.9 g/dL (ref 30.0–36.0)
MCV: 89.2 fL (ref 80.0–100.0)
Platelets: 174 10*3/uL (ref 150–400)
RBC: 3.89 MIL/uL — ABNORMAL LOW (ref 4.22–5.81)
RDW: 14.2 % (ref 11.5–15.5)
WBC: 5.9 10*3/uL (ref 4.0–10.5)
nRBC: 0 % (ref 0.0–0.2)

## 2023-07-11 LAB — ECHOCARDIOGRAM COMPLETE
Area-P 1/2: 3.21 cm2
S' Lateral: 2.6 cm
Weight: 2430.35 [oz_av]

## 2023-07-11 LAB — HEMOGLOBIN A1C
Hgb A1c MFr Bld: 5.3 % (ref 4.8–5.6)
Mean Plasma Glucose: 105.41 mg/dL

## 2023-07-11 LAB — LIPID PANEL
Cholesterol: 86 mg/dL (ref 0–200)
HDL: 36 mg/dL — ABNORMAL LOW (ref >40)
LDL Cholesterol: 39 mg/dL (ref 0–99)
Total CHOL/HDL Ratio: 2.4 ratio
Triglycerides: 56 mg/dL (ref <150)
VLDL: 11 mg/dL (ref 0–40)

## 2023-07-11 LAB — MAGNESIUM: Magnesium: 1.8 mg/dL (ref 1.7–2.4)

## 2023-07-11 SURGERY — LOOP RECORDER INSERTION
Anesthesia: LOCAL

## 2023-07-11 MED ORDER — ASPIRIN 81 MG PO TBEC: 81.0000 mg | DELAYED_RELEASE_TABLET | Freq: Every day | ORAL | 0 refills | Status: AC

## 2023-07-11 MED ORDER — ROSUVASTATIN CALCIUM 5 MG PO TABS
10.0000 mg | ORAL_TABLET | Freq: Every day | ORAL | Status: DC
  Administered 2023-07-11: 10 mg via ORAL
  Filled 2023-07-11: qty 2

## 2023-07-11 MED ORDER — STROKE: EARLY STAGES OF RECOVERY BOOK
Freq: Once | Status: AC
  Administered 2023-07-11: 1
  Filled 2023-07-11: qty 1

## 2023-07-11 MED ORDER — CLOPIDOGREL BISULFATE 75 MG PO TABS: 75.0000 mg | ORAL_TABLET | Freq: Every day | ORAL | 2 refills | Status: DC

## 2023-07-11 MED ORDER — TAMSULOSIN HCL 0.4 MG PO CAPS
0.4000 mg | ORAL_CAPSULE | Freq: Two times a day (BID) | ORAL | Status: DC
  Administered 2023-07-11: 0.4 mg via ORAL
  Filled 2023-07-11: qty 1

## 2023-07-11 MED ORDER — LIDOCAINE-EPINEPHRINE 1 %-1:100000 IJ SOLN
INTRAMUSCULAR | Status: AC
  Filled 2023-07-11: qty 1

## 2023-07-11 MED ORDER — CLOPIDOGREL BISULFATE 75 MG PO TABS
75.0000 mg | ORAL_TABLET | Freq: Every day | ORAL | Status: DC
  Administered 2023-07-11: 75 mg via ORAL
  Filled 2023-07-11: qty 1

## 2023-07-11 MED ORDER — ASPIRIN 81 MG PO TBEC
81.0000 mg | DELAYED_RELEASE_TABLET | Freq: Every day | ORAL | Status: DC
  Administered 2023-07-11: 81 mg via ORAL
  Filled 2023-07-11: qty 1

## 2023-07-11 MED ORDER — LIDOCAINE-EPINEPHRINE 1 %-1:100000 IJ SOLN
INTRAMUSCULAR | Status: DC | PRN
  Administered 2023-07-11: 30 mL

## 2023-07-11 SURGICAL SUPPLY — 2 items
MONITOR REVEAL LINQ II (Prosthesis & Implant Heart) IMPLANT
PACK LOOP INSERTION (CUSTOM PROCEDURE TRAY) ×1 IMPLANT

## 2023-07-11 NOTE — Progress Notes (Signed)
 Bilateral lower extremity venous duplex has been completed. Preliminary results can be found in CV Proc through chart review.   07/11/23 2:28 PM Birda Buffy RVT

## 2023-07-11 NOTE — Evaluation (Signed)
 Occupational Therapy Evaluation Patient Details Name: Robert Shaw MRN: [id number] DOB: 10-29-1950 Today's Date: 07/11/2023   History of Present Illness   Pt is a 73 y.o. male who presented 07/10/23 with L arm numbness, weakness, and incoordination. Pt received TNK. CT head negative. 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 PMH: bradycardia, CAD, dyslipidemia, HTN, NSTEMI, tobacco abuse     Clinical Impressions Patient admitted for the diagnosis above.  PTA he lives at home with his spouse, and needed no assist for any aspect of ADL,iADL or mobility.  Patient endorses resolution of symptoms, and appears to be at his baseline for ADL, iADL and mobility.  No further OT needs in the acute setting and no post acute OT indicated.       If plan is discharge home, recommend the following:   Assist for transportation     Functional Status Assessment   Patient has not had a recent decline in their functional status     Equipment Recommendations   None recommended by OT     Recommendations for Other Services         Precautions/Restrictions   Precautions Precautions: None     Mobility Bed Mobility Overal bed mobility: Modified Independent                  Transfers Overall transfer level: Modified independent Equipment used: None                      Balance Overall balance assessment: No apparent balance deficits (not formally assessed)                                         ADL either performed or assessed with clinical judgement   ADL Overall ADL's : At baseline                                             Vision Patient Visual Report: No change from baseline       Perception Perception: Within Functional Limits       Praxis Praxis: WFL       Pertinent Vitals/Pain Pain Assessment Pain Assessment: No/denies  pain     Extremity/Trunk Assessment Upper Extremity Assessment Upper Extremity Assessment: Overall WFL for tasks assessed   Lower Extremity Assessment Lower Extremity Assessment: Defer to PT evaluation LLE Deficits / Details: MMT scores of 5/5 grossly bil; sensation and coordination intact bil; questionable propioception deficits noted with mobility though as pt tripped 2x when leading up with his L foot on the stairs, not fully clearing his L toes LLE Sensation: decreased proprioception LLE Coordination: WNL   Cervical / Trunk Assessment Cervical / Trunk Assessment: Normal   Communication Communication Communication: No apparent difficulties   Cognition Arousal: Alert Behavior During Therapy: WFL for tasks assessed/performed Cognition: No apparent impairments                                       Cueing  General Comments   Cueing Techniques: Verbal cues  HR spontaneously up to 160s briefly but waveform did not appear accurate, notified RN;  educated pt and wife of his concerning tendency to not fully clear his L foot on stairs and need to be more aware of its placement for his safety. Educated them on "BE FAST". Instructed them to contact his MD if the deficits continue to impact him to get an OPPT consult placed. They verbalized understanding.   Exercises     Shoulder Instructions      Home Living Family/patient expects to be discharged to:: Private residence Living Arrangements: Spouse/significant other Available Help at Discharge: Family;Available 24 hours/day Type of Home: House Home Access: Stairs to enter [employer] of Steps: 3-5 Entrance Stairs-Rails: None Home Layout: One level     Bathroom Shower/Tub: Chief Strategy Officer: Standard Bathroom Accessibility: No   Home Equipment: Cane - single point          Prior Functioning/Environment Prior Level of Function : Independent/Modified Independent                ADLs Comments: farms animals and garden    OT Problem List: Decreased strength;Impaired sensation   OT Treatment/Interventions:        OT Goals(Current goals can be found in the care plan section)   Acute Rehab OT Goals Patient Stated Goal: Return home tomorrow OT Goal Formulation: With patient Time For Goal Achievement: 07/15/23 Potential to Achieve Goals: Good   OT Frequency:       Co-evaluation              AM-PAC OT "6 Clicks" Daily Activity     Outcome Measure Help from another person eating meals?: None Help from another person taking care of personal grooming?: None Help from another person toileting, which includes using toliet, bedpan, or urinal?: None Help from another person bathing (including washing, rinsing, drying)?: None Help from another person to put on and taking off regular upper body clothing?: None Help from another person to put on and taking off regular lower body clothing?: None 6 Click Score: 24   End of Session Nurse Communication: Mobility status  Activity Tolerance: Patient tolerated treatment well Patient left: in bed;with call bell/phone within reach  OT Visit Diagnosis: Hemiplegia and hemiparesis Hemiplegia - Right/Left: Left Hemiplegia - dominant/non-dominant: Non-Dominant Hemiplegia - caused by: Cerebral infarction                Time: 7829-5621 OT Time Calculation (min): 11 min Charges:  OT General Charges $OT Visit: 1 Visit OT Evaluation $OT Eval Moderate Complexity: 1 Mod  07/11/2023  RP, OTR/L  Acute Rehabilitation Services  Office:  [phone]   Robert Shaw 07/11/2023, 3:09 PM

## 2023-07-11 NOTE — Care Management (Addendum)
  Transition of Care [facility]) Screening Note   Patient Details  Name: Robert Shaw Date of Birth: 09-17-1950   Transition of Care [facility]) CM/SW Contact:    Ronni Colace, RN Phone Number: 07/11/2023, 12:42 PM    Transition of Care Department [facility]) has reviewed patient and no TOC needs have been identified at this time. We will continue to monitor patient advancement through interdisciplinary progression rounds. If new patient transition needs arise, please place a TOC consult.  PT evaluation no recommendations

## 2023-07-11 NOTE — Progress Notes (Signed)
   07/11/23 1419  Spiritual Encounters  Type of Visit Initial  Care provided to: Pt and family  Reason for visit Routine spiritual support  OnCall Visit No  Spiritual Framework  Presenting Themes Values and beliefs;Significant life change;Coping tools  Community/Connection Family  Patient Stress Factors Health changes;Major life changes  Family Stress Factors Health changes;Major life changes  Interventions  Spiritual Care Interventions Made Established relationship of care and support;Compassionate presence;Reflective listening;Normalization of emotions;Prayer  Intervention Outcomes  Outcomes Connection to spiritual care;Awareness around self/spiritual resourses;Awareness of support  Spiritual Care Plan  Spiritual Care Issues Still Outstanding No further spiritual care needs at this time (see row info)    Chaplain responded to consult request for prayer. Pt stated that he had a stroke but he feels better now. Pt's wife was at the bedside. Pt explained how he strongly believes in God.   Chaplain listened patient's concerns and belief system. He was tearful. He requested a prayer. Chaplain was present, listened to them attentively, normalized the emotions, reduced the anxiety and offered a prayer for healing.   M.Kubra Welby Hale Resident [phone]

## 2023-07-11 NOTE — Discharge Instructions (Addendum)
 Care After Your Loop Recorder  You have a [employer] Loop Recorder   Monitor your cardiac device site for redness, swelling, and drainage. Call the device clinic at [phone] if you experience these symptoms or fever/chills.  If you notice bleeding from your site, hold firm, but gently pressure with two fingers for 5 minutes. Dried blood on the steri-strips when removing the outer bandage is normal.   Keep the large square bandage on your site for 24 hours and then you may remove it yourself. Keep the steri-strips underneath in place.   You may shower after 72 hours / 3 days from your procedure with the steri-strips in place. They will usually fall off on their own, or may be removed after 10 days. Pat dry.   Avoid lotions, ointments, or perfumes over your incision until it is well-healed.  Please do not submerge in water until your site is completely healed.   Your device is MRI compatible.   Remote monitoring is used to monitor your cardiac device from home. This monitoring is scheduled every month by our office. It allows us  to keep an eye on the function of your device to ensure it is working properly.           Dear [name],   Congratulations for your interest in quitting smoking!  Find a program that suits you best: when you want to quit, how you need support, where you live, and how you like to learn.    If you're ready to get started TODAY, consider scheduling a visit through [email or web address].  Appointments are available from 8am to 8pm, Monday to Friday.   Most health insurance plans will cover some level of tobacco cessation visits and medications.    Additional Resources: [employer] are also available to help you quit & provide the support you'll need. Many programs are available in both [place] and Spanish and have a long history of successfully helping people get off and stay off tobacco.    Quit Smoking Apps:  quitSTART at  [employer]iousBroker.de QuitGuide?at ForgetParking.dk Online education and resources: Smokefree  at [employer].gov Free Telephone Coaching: QuitNow,  Call 1-800-QUIT-NOW ((220)571-4141) or Text- Ready to 4081343942 *Quitline [place] has teamed up with Medicaid to offer a free 14 week program    Vaping- Want to Quit? Free 24/7 support. Call [facility]  [place], [place], [place], [place], [place]  [facility]

## 2023-07-11 NOTE — Discharge Summary (Addendum)
 Stroke Discharge Summary  Patient ID: Robert Shaw   MRN: [id number]      DOB: [birth year]  Date of Admission: 07/10/2023 Date of Discharge: 07/11/2023  Attending Physician:  Stroke, Md, MD Consultant(s):    cardiology, interventional radiology Patient's PCP:  Wyvonna Heidelberg, MD  DISCHARGE PRIMARY DIAGNOSIS:   Acute Ischemic Infarct:  right posterior MCA territory s/p TNK Etiology: Possibly embolic  Patient Active Problem List   Diagnosis Date Noted   Acute ischemic stroke (HCC) 07/10/2023   QT prolongation 06/27/2022   Acute kidney injury superimposed on CKD (HCC) 06/27/2022   Essential hypertension 06/27/2022   Exophthalmos 06/27/2022   Unstable angina (HCC) 06/26/2022   Polyp of colon    Hyperlipidemia 12/30/2012   Tobacco dependence 12/30/2012   Bradycardia 03/18/2012   NSTEMI (non-ST elevated myocardial infarction) 03/14/2012   CAD (coronary artery disease) 03/14/2012     Allergies as of 07/11/2023   No Known Allergies      Medication List     STOP taking these medications    tadalafil 20 MG tablet Commonly known as: CIALIS       TAKE these medications    acetaminophen  500 MG tablet Commonly known as: TYLENOL  Take 500 mg by mouth every 6 (six) hours as needed for moderate pain.   albuterol  108 (90 Base) MCG/ACT inhaler Commonly known as: VENTOLIN  HFA Inhale 2 puffs into the lungs every 4 (four) hours as needed for wheezing or shortness of breath.   amLODipine  5 MG tablet Commonly known as: NORVASC  Take 5 mg by mouth daily.   aspirin  EC 81 MG tablet Take 1 tablet (81 mg total) by mouth daily for 21 days. Swallow whole. What changed: additional instructions   clopidogrel  75 MG tablet Commonly known as: PLAVIX  Take 1 tablet (75 mg total) by mouth daily.   doxycycline  100 MG capsule Commonly known as: VIBRAMYCIN  Take 1 capsule (100 mg total) by mouth 2 (two) times daily.   loratadine  10 MG tablet Commonly known as: CLARITIN  Take  10 mg by mouth daily.   nitroGLYCERIN  0.4 MG SL tablet Commonly known as: NITROSTAT  Place 1 tablet (0.4 mg total) under the tongue every 5 (five) minutes as needed for chest pain (up to 3 doses).   rosuvastatin  10 MG tablet Commonly known as: CRESTOR  Take 10 mg by mouth daily. What changed: Another medication with the same name was removed. Continue taking this medication, and follow the directions you see here.   tamsulosin  0.4 MG Caps capsule Commonly known as: FLOMAX  Take 1 capsule (0.4 mg total) by mouth in the morning and at bedtime.        LABORATORY STUDIES CBC    Component Value Date/Time   WBC 5.9 07/11/2023 0440   RBC 3.89 (L) 07/11/2023 0440   HGB 12.1 (L) 07/11/2023 0440   HGB 11.8 (L) 09/11/2022 0942   HCT 34.7 (L) 07/11/2023 0440   HCT 35.4 (L) 09/11/2022 0942   PLT 174 07/11/2023 0440   PLT 219 09/11/2022 0942   MCV 89.2 07/11/2023 0440   MCV 89 09/11/2022 0942   MCH 31.1 07/11/2023 0440   MCHC 34.9 07/11/2023 0440   RDW 14.2 07/11/2023 0440   RDW 14.5 09/11/2022 0942   LYMPHSABS 2.3 07/10/2023 1201   MONOABS 0.3 07/10/2023 1201   EOSABS 0.0 07/10/2023 1201   BASOSABS 0.0 07/10/2023 1201   CMP    Component Value Date/Time   NA 138 07/11/2023 0440   NA 143  01/07/2023 0926   K 3.7 07/11/2023 0440   CL 109 07/11/2023 0440   CO2 23 07/11/2023 0440   GLUCOSE 105 (H) 07/11/2023 0440   BUN 7 (L) 07/11/2023 0440   BUN 9 01/07/2023 0926   CREATININE 1.40 (H) 07/11/2023 0440   CREATININE 1.42 (H) 09/14/2014 0823   CALCIUM  8.4 (L) 07/11/2023 0440   PROT 7.3 07/10/2023 1201   PROT 6.6 09/11/2022 0942   ALBUMIN 4.2 07/10/2023 1201   ALBUMIN 4.2 09/11/2022 0942   AST 23 07/10/2023 1201   ALT 13 07/10/2023 1201   ALKPHOS 86 07/10/2023 1201   BILITOT 1.3 (H) 07/10/2023 1201   BILITOT 0.6 09/11/2022 0942   GFRNONAA 54 (L) 07/11/2023 0440   GFRAA 63 10/08/2016 0941   COAGS Lab Results  Component Value Date   INR 1.1 07/10/2023   INR 1.01 03/17/2012    INR 1.14 03/15/2012   Lipid Panel    Component Value Date/Time   CHOL 86 07/11/2023 0440   CHOL 119 08/03/2022 0816   TRIG 56 07/11/2023 0440   HDL 36 (L) 07/11/2023 0440   HDL 52 08/03/2022 0816   CHOLHDL 2.4 07/11/2023 0440   VLDL 11 07/11/2023 0440   LDLCALC 39 07/11/2023 0440   LDLCALC 51 08/03/2022 0816   HgbA1C  Lab Results  Component Value Date   HGBA1C 5.3 07/11/2023   Alcohol Level    Component Value Date/Time   [facility] <15 07/10/2023 1201     SIGNIFICANT DIAGNOSTIC STUDIES EP PPM/ICD IMPLANT Result Date: 07/11/2023 SURGEON:  Efraim Grange, MD   PREPROCEDURE DIAGNOSIS:  Cryptogenic stroke   POSTPROCEDURE DIAGNOSIS:  Cryptogenic stroke    PROCEDURES:  1. Implantable loop recorder implantation   INTRODUCTION:  Robert Shaw  is a 73 y.o. patient with a history of cryptogenic stroke. Inpatient telemetry has been reviewed and not shown atrial fibrillation. The patient therefore presents today for implantable loop implantation. The costs of loop recorder monitoring have been discussed with the patient.   DESCRIPTION OF PROCEDURE:  Informed written consent was obtained.  A timeout was performed. The patient required no sedation for the procedure today.  The patients left chest was prepped and draped in the usual sterile fashion. The skin overlying the left parasternal region was infiltrated with lidocaine  for local analgesia.  A 0.5-cm incision was made over the left parasternal region over the 3rd intercostal space.  A Medtronic implantable loop recorder (SN [id number]) was then placed into the pocket  R waves were very prominent and measured >0.88mV.  Steri- Strips and a sterile dressing were then applied.  There were no early apparent complications.   CONCLUSIONS:  1. Successful implantation of a Medtronic implantable loop recorder for a history of cryptogenic stroke  2. No early apparent complications. Efraim Grange, MD Cardiac Electrophysiology   VAS US  LOWER EXTREMITY  VENOUS (DVT) Result Date: 07/11/2023  Lower Venous DVT Study Patient Name:  Robert Shaw  Date of Exam:   07/11/2023 Medical Rec #: [id number]       Accession #:    [id number] Date of Birth: [birth year]       Patient Gender: M Patient Age:   73 years Exam Location:  [facility] Procedure:      VAS US  LOWER EXTREMITY VENOUS (DVT) Referring Phys: Margart Shears DE LA TORRE --------------------------------------------------------------------------------  Indications: Stroke.  Risk Factors: None identified. Comparison Study: No prior studies. Performing Technologist: Lerry Ransom RVT  Examination Guidelines: A complete evaluation includes B-mode imaging,  spectral Doppler, color Doppler, and power Doppler as needed of all accessible portions of each vessel. Bilateral testing is considered an integral part of a complete examination. Limited examinations for reoccurring indications may be performed as noted. The reflux portion of the exam is performed with the patient in reverse Trendelenburg.  +---------+---------------+---------+-----------+----------+--------------+ RIGHT    CompressibilityPhasicitySpontaneityPropertiesThrombus Aging +---------+---------------+---------+-----------+----------+--------------+ CFV      Full           Yes      Yes                                 +---------+---------------+---------+-----------+----------+--------------+ SFJ      Full                                                        +---------+---------------+---------+-----------+----------+--------------+ FV Prox  Full                                                        +---------+---------------+---------+-----------+----------+--------------+ FV Mid   Full                                                        +---------+---------------+---------+-----------+----------+--------------+ FV DistalFull                                                         +---------+---------------+---------+-----------+----------+--------------+ PFV      Full                                                        +---------+---------------+---------+-----------+----------+--------------+ POP      Full           Yes      Yes                                 +---------+---------------+---------+-----------+----------+--------------+ PTV      Full                                                        +---------+---------------+---------+-----------+----------+--------------+ PERO     Full                                                        +---------+---------------+---------+-----------+----------+--------------+   +---------+---------------+---------+-----------+----------+--------------+  LEFT     CompressibilityPhasicitySpontaneityPropertiesThrombus Aging +---------+---------------+---------+-----------+----------+--------------+ CFV      Full           Yes      Yes                                 +---------+---------------+---------+-----------+----------+--------------+ SFJ      Full                                                        +---------+---------------+---------+-----------+----------+--------------+ FV Prox  Full                                                        +---------+---------------+---------+-----------+----------+--------------+ FV Mid   Full                                                        +---------+---------------+---------+-----------+----------+--------------+ FV DistalFull                                                        +---------+---------------+---------+-----------+----------+--------------+ PFV      Full                                                        +---------+---------------+---------+-----------+----------+--------------+ POP      Full           Yes      Yes                                  +---------+---------------+---------+-----------+----------+--------------+ PTV      Full                                                        +---------+---------------+---------+-----------+----------+--------------+ PERO     Full                                                        +---------+---------------+---------+-----------+----------+--------------+     Summary: RIGHT: - There is no evidence of deep vein thrombosis in the lower extremity.  - No cystic structure found in the popliteal fossa.  LEFT: - There is no evidence of deep vein thrombosis in the lower extremity.  - No  cystic structure found in the popliteal fossa.  *See table(s) above for measurements and observations.    Preliminary    ECHOCARDIOGRAM COMPLETE Result Date: 07/11/2023    ECHOCARDIOGRAM REPORT   Patient Name:   MALEKE FERIA Date of Exam: 07/11/2023 Medical Rec #:  [id number]      Height:       72.0 in Accession #:    [id number]     Weight:       151.9 lb Date of Birth:  1950/03/26      BSA:          1.895 m Patient Age:    72 years       BP:           122/59 mmHg Patient Gender: M              HR:           57 bpm. Exam Location:  Inpatient Procedure: 2D Echo, Cardiac Doppler and Color Doppler (Both Spectral and Color            Flow Doppler were utilized during procedure). Indications:    Stroke  History:        Patient has prior history of Echocardiogram examinations, most                 recent 06/26/2022. Risk Factors:Hypertension.  Sonographer:    Janette Medley Referring Phys: ERIN C LEHNER IMPRESSIONS  1. Left ventricular ejection fraction, by estimation, is 60 to 65%. The left ventricle has normal function. The left ventricle has no regional wall motion abnormalities. Left ventricular diastolic parameters were normal.  2. Right ventricular systolic function is normal. The right ventricular size is normal. Tricuspid regurgitation signal is inadequate for assessing PA pressure.  3. The mitral valve is normal in  structure. Trivial mitral valve regurgitation. No evidence of mitral stenosis.  4. The aortic valve was not well visualized. Aortic valve regurgitation is trivial. Aortic valve sclerosis/calcification is present, without any evidence of aortic stenosis.  5. The inferior vena cava is normal in size with greater than 50% respiratory variability, suggesting right atrial pressure of 3 mmHg. FINDINGS  Left Ventricle: Left ventricular ejection fraction, by estimation, is 60 to 65%. The left ventricle has normal function. The left ventricle has no regional wall motion abnormalities. The left ventricular internal cavity size was normal in size. There is  no left ventricular hypertrophy. Left ventricular diastolic parameters were normal. Normal left ventricular filling pressure. Right Ventricle: The right ventricular size is normal. No increase in right ventricular wall thickness. Right ventricular systolic function is normal. Tricuspid regurgitation signal is inadequate for assessing PA pressure. Left Atrium: Left atrial size was normal in size. Right Atrium: Right atrial size was normal in size. Pericardium: There is no evidence of pericardial effusion. Mitral Valve: The mitral valve is normal in structure. Trivial mitral valve regurgitation. No evidence of mitral valve stenosis. Tricuspid Valve: The tricuspid valve is normal in structure. Tricuspid valve regurgitation is trivial. No evidence of tricuspid stenosis. Aortic Valve: The aortic valve was not well visualized. Aortic valve regurgitation is trivial. Aortic valve sclerosis/calcification is present, without any evidence of aortic stenosis. Pulmonic Valve: The pulmonic valve was normal in structure. Pulmonic valve regurgitation is not visualized. No evidence of pulmonic stenosis. Aorta: The aortic root is normal in size and structure. Venous: The inferior vena cava is normal in size with greater than 50% respiratory variability, suggesting right atrial pressure of 3  mmHg.  IAS/Shunts: No atrial level shunt detected by color flow Doppler.  LEFT VENTRICLE PLAX 2D LVIDd:         4.10 cm   Diastology LVIDs:         2.60 cm   LV e' medial:    8.81 cm/s LV PW:         1.00 cm   LV E/e' medial:  7.0 LV IVS:        1.10 cm   LV e' lateral:   10.40 cm/s LVOT diam:     2.00 cm   LV E/e' lateral: 5.9 LV SV:         76 LV SV Index:   40 LVOT Area:     3.14 cm  RIGHT VENTRICLE             IVC RV S prime:     15.20 cm/s  IVC diam: 2.00 cm TAPSE (M-mode): 2.8 cm LEFT ATRIUM             Index        RIGHT ATRIUM           Index LA diam:        2.60 cm 1.37 cm/m   RA Area:     11.50 cm LA Vol (A2C):   14.8 ml 7.81 ml/m   RA Volume:   22.20 ml  11.71 ml/m LA Vol (A4C):   25.9 ml 13.66 ml/m LA Biplane Vol: 20.1 ml 10.60 ml/m  AORTIC VALVE LVOT Vmax:   104.00 cm/s LVOT Vmean:  62.400 cm/s LVOT VTI:    0.243 m  AORTA Ao Root diam: 3.30 cm Ao Asc diam:  3.60 cm MITRAL VALVE MV Area (PHT): 3.21 cm    SHUNTS MV Decel Time: 236 msec    Systemic VTI:  0.24 m MV E velocity: 61.80 cm/s  Systemic Diam: 2.00 cm MV A velocity: 79.10 cm/s MV E/A ratio:  0.78 Gaylyn Keas MD Electronically signed by Gaylyn Keas MD Signature Date/Time: 07/11/2023/1:33:38 PM    Final    MR BRAIN WO CONTRAST Result Date: 07/11/2023 CLINICAL DATA:  73 year old male code stroke presentation yesterday. 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 EXAM: MRI HEAD WITHOUT CONTRAST TECHNIQUE: Multiplanar, multiecho pulse sequences of the brain and surrounding structures were obtained without intravenous contrast. COMPARISON:  Head CT, CTA head and neck yesterday. FINDINGS: Brain: Positive for superior right perirolandic cortical and subcortical white matter restricted diffusion in an area of about 2.5 cm (series 2, image 45). This appears to be the post sensory gyrus. Linear extension of restricted diffusion from there into the posterior right centrum semiovale, toward the posterior right corona radiata (series 3,  image 13). T2 and FLAIR hyperintense cytotoxic edema. No hemorrhage or mass effect. No other convincing diffusion restriction. Midline shift, mass effect, evidence of mass lesion, ventriculomegaly, extra-axial collection or acute intracranial hemorrhage. Cervicomedullary junction and pituitary are within normal limits. Patchy, scattered bilateral cerebral white matter T2 and FLAIR hyperintensity in both hemispheres. Small area of chronic cortical encephalomalacia in the left occipital pole (series 6 image 14) possibly with mild hemosiderin there. No other chronic cerebral blood products identified. T2 heterogeneity in the bilateral deep gray nuclei appears to be largely perivascular space related. Brainstem and cerebellum appear negative. Vascular: Major intracranial vascular flow voids are preserved. Redemonstrated asymmetric and abnormal flow voids along the periphery of the left cerebral hemisphere which largely appear to be abnormal draining veins (series 5, image 15). As on the  CTA, difficult to identify any specific arterial nidus associated with this finding. Skull and upper cervical spine: Partially visible bulky cervical spine Diffuse idiopathic skeletal hyperostosis (DISH). Normal background bone marrow signal. Sinuses/Orbits: Negative. Other: Grossly normal visible internal auditory structures. IMPRESSION: 1. Positive for Acute posterior Right MCA territory infarct, affecting the post sensory gyrus and associated white matter tracks. No hemorrhage or mass effect. 2. 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 See CTA findings and recommendation yesterday. 3. Small chronic infarct in the left occipital pole. Electronically Signed   By: Marlise Simpers M.D.   On: 07/11/2023 12:37   CT ANGIO HEAD NECK W WO CM (CODE STROKE) Result Date: 07/10/2023 CLINICAL DATA:  73 year old male code  stroke presentation. Left side weakness. EXAM: CT ANGIOGRAPHY HEAD AND NECK TECHNIQUE: Multidetector CT imaging of the head and neck was performed using the standard protocol during bolus administration of intravenous contrast. Multiplanar CT image reconstructions and MIPs were obtained to evaluate the vascular anatomy. Carotid stenosis measurements (when applicable) are obtained utilizing NASCET criteria, using the distal internal carotid diameter as the denominator. RADIATION DOSE REDUCTION: This exam was performed according to the departmental dose-optimization program which includes automated exposure control, adjustment of the mA and/or kV according to patient size and/or use of iterative reconstruction technique. CONTRAST:  75mL OMNIPAQUE  IOHEXOL  350 MG/ML SOLN COMPARISON:  Plain head CT 1154 hours today. FINDINGS: CTA NECK Skeleton: Bulky Diffuse idiopathic skeletal hyperostosis (DISH). Large cervical anterior endplate osteophytes throughout. Associated interbody ankylosis C5-C6 and C6-C7. No acute osseous abnormality identified. Upper chest: Emphysema and apical lung scarring. Other neck: Nonvascular neck soft tissue spaces appear within normal limits. Aortic arch: 3 vessel arch. Minimal arch atherosclerosis is visible. Right carotid system: Patent with no significant atherosclerosis in the neck. Moderately tortuous right ICA distal to the bulb. No stenosis. Left carotid system: Patent with mild calcified plaque at the lateral left ICA origin. No stenosis. Moderate left ICA tortuosity just below the skull base. Vertebral arteries: Mild proximal right subclavian artery plaque without stenosis. Normal right vertebral artery origin. Tortuous right V1 segment. Mildly tortuous right vertebral artery otherwise to the skull base with no plaque or stenosis. Proximal left subclavian artery minimal plaque and no stenosis. Normal left vertebral artery origin. Non dominant left vertebral artery is patent to the skull  base with no significant plaque or stenosis. CTA HEAD Posterior circulation: Patent distal vertebral arteries and vertebrobasilar junction. Calcified V4 segment plaque bilaterally but no stenosis. Dominant right V4. Patent basilar artery without stenosis. Patent dominant appearing AICAs, patent SCA and PCA origins. Posterior communicating arteries are diminutive or absent. Bilateral PCA branches are patent with mild tortuosity, no stenosis. Anterior circulation: Bilateral ICA siphons are patent. Left siphon moderate calcified plaque. Moderate supraclinoid left ICA stenosis on series 5, image 253. Contralateral right siphon similar extensive cavernous through anterior genu segment plaque with moderate stenosis on series 5, image 265. Patent carotid termini. MCA and ACA origins appear normal. Tortuous A1 segments. Diminutive or absent anterior communicating artery. Bilateral ACA branches are within normal limits. Left MCA M1 segment bifurcates early without stenosis. Right MCA M1 segment bifurcates without stenosis. Right MCA branches are within normal limits. Left MCA branches appear only mildly hypertrophied, but there is extensive asymmetric early venous enhancement along the periphery of the left hemisphere with tortuous and enlarged draining cortical veins (series 5 image 206). These both tract toward the superior  sagittal sinus and drain into the left sphenoid 0 parietal sinus. There is no early cavernous sinus enhancement. And the finding is somewhat diffuse, lacking a discrete nidus although the left sylvian fissure may be the epicenter. Similarly, the left ICA siphon nor the left MCA branch caliber seem enlarged. No arterial aneurysm is identified. Venous sinuses: Patent with abnormal left hemisphere venous structures as above. Anatomic variants: Dominant right vertebral artery. Review of the MIP images confirms the above findings IMPRESSION: 1. Negative for large vessel occlusion. 2. 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 Neuro endovascular consultation is suggested to evaluate the appropriateness of potential treatment. 3. No significant extracranial atherosclerosis or stenosis. Intracranial atherosclerosis primarily limited to the ICA siphons where there is up to moderate bilateral siphon stenosis due to calcified plaque. 4.  Aortic Atherosclerosis (ICD10-I70.0). 5.  Emphysema (ICD10-J43.9). Electronically Signed   By: Marlise Simpers M.D.   On: 07/10/2023 13:13   CT HEAD CODE STROKE WO CONTRAST Addendum Date: 07/10/2023 ADDENDUM REPORT: 07/10/2023 12:24 ADDENDUM: Study discussed by telephone with Dr. Roxy Cordial on 07/10/2023 at 1213 hours. Electronically Signed   By: Marlise Simpers M.D.   On: 07/10/2023 12:24   Result Date: 07/10/2023 CLINICAL DATA:  Code stroke. 73 year old male with acute left side deficit. EXAM: CT HEAD WITHOUT CONTRAST TECHNIQUE: Contiguous axial images were obtained from the base of the skull through the vertex without intravenous contrast. RADIATION DOSE REDUCTION: This exam was performed according to the departmental dose-optimization program which includes automated exposure control, adjustment of the mA and/or kV according to patient size and/or use of iterative reconstruction technique. COMPARISON:  Head CT 12/16/2014. FINDINGS: Brain: No midline shift, ventriculomegaly, mass effect, evidence of mass lesion, intracranial hemorrhage or evidence of cortically based acute infarction. Chronic appearing cortical encephalomalacia in the posterosuperior left frontal gyrus (was present in 2016) and also the left superior parietal lobe (new since that time series 2, image 28). Chronic cortical encephalomalacia in the lateral left occipital pole was present in 2016. Patchy and confluent additional bilateral cerebral white matter hypodensity.  Vascular: Calcified atherosclerosis at the skull base. No suspicious intracranial vascular hyperdensity. Skull: Stable, intact. Sinuses/Orbits: Visualized paranasal sinuses and mastoids are clear. Other: No gaze deviation. Visualized orbits and scalp soft tissues are within normal limits. ASPECTS [facility] Stroke Program Early CT Score) Total score (0-10 with 10 being normal): Ten, chronic encephalomalacia. IMPRESSION: 1. No acute cortically based infarct or acute intracranial hemorrhage identified. ASPECTS 10. 2. Moderate for age white matter disease. And chronic appearing cortical infarcts in the left frontal, parietal, and occipital lobes - most present in 2016. Electronically Signed: By: Marlise Simpers M.D. On: 07/10/2023 12:02       HISTORY OF PRESENT ILLNESS 74 y.o. patient with history of hyperlipidemia, hypertension and CAD was admitted with acute onset left arm numbness, weakness and incoordination.  HOSPITAL COURSE Patient originally presented to [facility] emergency department and was given TNK there to treat his potential stroke.  He was transferred here for further care.  MRI reveals acute ischemic stroke in the posterior right MCA territory.  Stroke is suspicious for embolic etiology.  Patient was evaluated by electrophysiology and had loop recorder placed.  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  He was evaluated by interventional  radiology, and we will follow-up with them in 1 week.  Acute Ischemic Infarct:  right posterior MCA territory s/p TNK Etiology: cryptogenic Code Stroke CT head No acute abnormality. Small vessel disease. ASPECTS 10.    CTA head & neck no LVO, bilateral moderate siphon stenosis, left-sided dural venous fistula MRI acute infarct in posterior right MCA territory 2D Echo EF 60 to 65%, normal left atrial size, no atrial level shunt LE venous Doppler no DVT Loop recorder placed LDL 39 HgbA1c 5.3 UDS negative VTE  prophylaxis -SCDs aspirin  81 mg daily prior to admission, now on aspirin  81 mg daily and Plavix  75 mg daily for 3 weeks then Plavix  alone Therapy recommendations: No PT/OT follow-up Disposition: Home   Hx of Stroke/TIA Patient has multiple chronic infarcts seen on CT and MRI Likely silent stroke   Hypertension Home meds: Amlodipine  5 mg daily Stable Long-term BP goal normotensive   Hyperlipidemia Home meds: Rosuvastatin  10 mg daily resumed Crestor  10 in hospital LDL 39, goal < 70 High intensity statin not indicated as LDL below goal Continue statin at discharge   Left-sided AVM, 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 seen on CT angiogram and MRI Patient was seen by interventional radiology and will have outpatient follow-up for potential angiogram   Tobacco Abuse Patient smokes 0.1 packs per day for 40 years Will discuss cessation with patient Nicotine replacement therapy provided   Other medical conditions CKD 3 AA creatinine 1.5--1.4   DISCHARGE EXAM  PHYSICAL EXAM General:  Alert, well-nourished, well-developed patient in no acute distress Psych:  Mood and affect appropriate for situation CV: Regular rate and rhythm on monitor Respiratory:  Regular, unlabored respirations on room air     NEURO:  Mental Status: AA&Ox3, patient is able to give clear and coherent history Speech/Language: speech is without dysarthria or aphasia.     Cranial Nerves:  II: PERRL. Visual fields full.  III, IV, VI: EOMI. Eyelids elevate symmetrically.  V: Sensation is intact to light touch and symmetrical to face.  VII: Face is symmetrical resting and smiling VIII: hearing intact to voice. IX, X: Palate elevates symmetrically. Phonation is normal.  BJ:YNWGNFAO shrug 5/5. XII: tongue is midline without fasciculations. Motor: 5/5 strength to all muscle groups tested.  Tone: is normal and bulk is normal Sensation- Intact to light touch bilaterally.   Coordination: FTN intact bilaterally Gait- deferred  1a Level of Conscious.: 0 1b LOC Questions: 0 1c LOC Commands: 0 2 Best Gaze: 0 3 Visual: 0 4 Facial Palsy: 0 5a Motor Arm - left: 0 5b Motor Arm - Right: 0 6a Motor Leg - Left: 0 6b Motor Leg - Right: 0 7 Limb Ataxia: 0 8 Sensory: 0 9 Best Language: 0 10 Dysarthria: 0 11 Extinct. and Inatten.: 0 TOTAL: 0   Discharge Diet       Diet   Diet Heart Room service appropriate? Yes with Assist; Fluid consistency: Thin   liquids  DISCHARGE PLAN Disposition: Home aspirin  81 mg daily and clopidogrel  75 mg daily for secondary stroke prevention for 3 weeks then clopidogrel  75 mg daily alone. Ongoing stroke risk factor control by Primary Care Physician at time of discharge Follow-up PCP [name], [name], MD in 2 weeks. Follow up with interventional radiology in 1 week Follow-up in [facility] in 8 weeks, office to schedule an appointment.   35 minutes were spent preparing discharge.  Cortney E Bucky Cardinal , MSN, AGACNP-BC [facility] See Amion  for schedule and pager information 07/11/2023 5:25 PM  ATTENDING NOTE: I reviewed above note and agree with the assessment and plan. Pt was seen and examined.   No family at bedside.  Patient symptoms resolved shortly after TNK administration per RN.  Currently neuro intact.  Stroke etiology unclear, loop recorder placed.  Continue DAPT and statin.  Follow-up with [facility]  For detailed assessment and plan, please refer to above as I have made changes wherever appropriate.   Consuelo Denmark, MD PhD Stroke Neurology 07/11/2023 8:43 PM

## 2023-07-11 NOTE — Evaluation (Signed)
 Physical Therapy Evaluation & Discharge Patient Details Name: Robert Shaw MRN: [id number] DOB: March 04, 1950 Today's Date: 07/11/2023  History of Present Illness  Pt is a 73 y.o. male who presented 07/10/23 with L arm numbness, weakness, and incoordination. Pt received TNK. CT head negative. 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 PMH: bradycardia, CAD, dyslipidemia, HTN, NSTEMI, tobacco abuse   Clinical Impression  Pt presents with condition above. PTA, he was independent without AD, living with his wife in a 1-level house with 3-5 STE. Currently, the pt is functioning near his baseline. He does report his L arm "is almost back to normal", reporting it still feels mildly odd when trying to grip objects. His bil legs appeared symmetrical, intact, and WFL for strength, sensation, and coordination. He was independent without AD when ambulating this date. However, when navigating stairs he tripped multiple times when leading up with his L foot, needing up to minA at times to recover, suggesting potential mild L leg proprioception deficits impacting him from consistently clearing the step. Educated pt and wife of his concerning tendency to not fully clear his L foot on stairs and need to be more aware of its placement for his safety. He was able to safely demonstrate understanding of this education with subsequent stairs. Educated them on "BE FAST". Instructed them to contact his MD if the deficits continue to impact him to get an OPPT consult placed. They verbalized understanding. At this time, do not anticipate a need for post acute PT as he will likely progress well as he continues to mobilize. All education completed and questions answered. No further skilled acute PT services needed. Will sign off. Thank you for this referral.      If plan is discharge home, recommend the following: Help with stairs or ramp for  entrance;Assistance with cooking/housework;Assist for transportation   Can travel by private vehicle        Equipment Recommendations None recommended by PT  Recommendations for Other Services       Functional Status Assessment Patient has had a recent decline in their functional status and demonstrates the ability to make significant improvements in function in a reasonable and predictable amount of time.     Precautions / Restrictions Precautions Precautions: Other (comment) Precaution/Restrictions Comments: watch HR (unsure of accuracy of tachycardia briefly during PT Eval) Restrictions Weight Bearing Restrictions Per Provider Order: No      Mobility  Bed Mobility Overal bed mobility: Modified Independent             General bed mobility comments: HOB elevated, no assistance needed    Transfers Overall transfer level: Independent Equipment used: None Transfers: Sit to/from Stand Sit to Stand: Independent           General transfer comment: No assistance needed, no LOB    Ambulation/Gait Ambulation/Gait assistance: Independent Gait Distance (Feet): 340 Feet Assistive device: None Gait Pattern/deviations: WFL(Within Functional Limits) Gait velocity: WFL     General Gait Details: Pt ambulates steadily with step-through gait pattern with no overt LOB, even when cued to turn head L <> R and up <> down, change directions, and change speeds.  Stairs Stairs: Yes Stairs assistance: Contact guard assist, Min assist Stair Management: No rails, One rail Left, Alternating pattern, Forwards, Backwards, Step to pattern Number of Stairs: 7 General stair comments: Ascends forward with reciprocal pattern and descends backwards with step-to pattern (fearful of heights and turning  around to face forward when descending). Ascended the first 3 stairs without rail support but pt tripped when leading with his L foot due to his L toe catching the top of the step, needing minA  to recover. Pt used the L rails for the remaining stairs, CGA for safety with pt tripping a second time with his L leg leading up but recovered his balance on his own. Practiced being more thoughtful and aware of his L foot placement with pt no longer tripping with subsequent reps.  Wheelchair Mobility     Tilt Bed    Modified Rankin (Stroke Patients Only)       Balance Overall balance assessment: No apparent balance deficits (not formally assessed) (when on even surfaces, noted deficits on stairs)                                           Pertinent Vitals/Pain Pain Assessment Pain Assessment: Faces Faces Pain Scale: No hurt Pain Intervention(s): Monitored during session    Home Living Family/patient expects to be discharged to:: Private residence Living Arrangements: Spouse/significant other Available Help at Discharge: Family;Available 24 hours/day Type of Home: House Home Access: Stairs to enter Entrance Stairs-Rails: None Entrance Stairs-Number of Steps: 3-5   Home Layout: One level Home Equipment: Cane - single point      Prior Function Prior Level of Function : Independent/Modified Independent               ADLs Comments: farms animals and garden     Extremity/Trunk Assessment   Upper Extremity Assessment Upper Extremity Assessment: Defer to OT evaluation;Right hand dominant    Lower Extremity Assessment Lower Extremity Assessment: LLE deficits/detail LLE Deficits / Details: MMT scores of 5/5 grossly bil; sensation and coordination intact bil; questionable propioception deficits noted with mobility though as pt tripped 2x when leading up with his L foot on the stairs, not fully clearing his L toes LLE Sensation: decreased proprioception LLE Coordination: WNL    Cervical / Trunk Assessment Cervical / Trunk Assessment: Normal  Communication   Communication Communication: No apparent difficulties    Cognition Arousal:  Alert Behavior During Therapy: WFL for tasks assessed/performed   PT - Cognitive impairments: No apparent impairments                       PT - Cognition Comments: wife reports pt appears at his baseline; noted some mild deficits with comprehending some more complex cues, but may be baseline Following commands: Intact       Cueing Cueing Techniques: Verbal cues     General Comments General comments (skin integrity, edema, etc.): HR spontaneously up to 160s briefly but waveform did not appear accurate, notified RN; educated pt and wife of his concerning tendency to not fully clear his L foot on stairs and need to be more aware of its placement for his safety. Educated them on "BE FAST". Instructed them to contact his MD if the deficits continue to impact him to get an OPPT consult placed. They verbalized understanding.    Exercises     Assessment/Plan    PT Assessment Patient does not need any further PT services  PT Problem List         PT Treatment Interventions      PT Goals (Current goals can be found in the Care Plan section)  Acute  Rehab PT Goals Patient Stated Goal: to go home PT Goal Formulation: All assessment and education complete, DC therapy Time For Goal Achievement: 07/12/23 Potential to Achieve Goals: Good    Frequency       Co-evaluation               AM-PAC PT "6 Clicks" Mobility  Outcome Measure Help needed turning from your back to your side while in a flat bed without using bedrails?: None Help needed moving from lying on your back to sitting on the side of a flat bed without using bedrails?: None Help needed moving to and from a bed to a chair (including a wheelchair)?: None Help needed standing up from a chair using your arms (e.g., wheelchair or bedside chair)?: None Help needed to walk in hospital room?: None Help needed climbing 3-5 steps with a railing? : A Little 6 Click Score: 23    End of Session   Activity Tolerance:  Patient tolerated treatment well Patient left: in bed;with call bell/phone within reach;with family/visitor present Nurse Communication: Mobility status (able to take himself to bathroom, educated pt on how to [employer]) PT Visit Diagnosis: Other symptoms and signs involving the nervous system (R29.898)    Time: 0865-7846 PT Time Calculation (min) (ACUTE ONLY): 32 min   Charges:   PT Evaluation $PT Eval Low Complexity: 1 Low PT Treatments $Therapeutic Activity: 8-22 mins PT General Charges $$ ACUTE PT VISIT: 1 Visit         Vernida Goodie, PT, DPT Acute Rehabilitation Services  Office: [phone]   Ellyn Hack 07/11/2023, 1:39 PM

## 2023-07-11 NOTE — Progress Notes (Signed)
 STROKE TEAM PROGRESS NOTE    SIGNIFICANT HOSPITAL EVENTS 5/28-patient admitted with acute onset left arm numbness and weakness, TNK administered  INTERIM HISTORY/SUBJECTIVE Patient has remained hemodynamically stable and afebrile overnight.  He reports that his left arm weakness and numbness have resolved.  MRI is pending.  He will likely need angiogram and evaluation of dural venous fistula but states that he prefers to go home today and have this done as an outpatient.  OBJECTIVE  CBC    Component Value Date/Time   WBC 5.9 07/11/2023 0440   RBC 3.89 (L) 07/11/2023 0440   HGB 12.1 (L) 07/11/2023 0440   HGB 11.8 (L) 09/11/2022 0942   HCT 34.7 (L) 07/11/2023 0440   HCT 35.4 (L) 09/11/2022 0942   PLT 174 07/11/2023 0440   PLT 219 09/11/2022 0942   MCV 89.2 07/11/2023 0440   MCV 89 09/11/2022 0942   MCH 31.1 07/11/2023 0440   MCHC 34.9 07/11/2023 0440   RDW 14.2 07/11/2023 0440   RDW 14.5 09/11/2022 0942   LYMPHSABS 2.3 07/10/2023 1201   MONOABS 0.3 07/10/2023 1201   EOSABS 0.0 07/10/2023 1201   BASOSABS 0.0 07/10/2023 1201    BMET    Component Value Date/Time   NA 138 07/11/2023 0440   NA 143 01/07/2023 0926   K 3.7 07/11/2023 0440   CL 109 07/11/2023 0440   CO2 23 07/11/2023 0440   GLUCOSE 105 (H) 07/11/2023 0440   BUN 7 (L) 07/11/2023 0440   BUN 9 01/07/2023 0926   CREATININE 1.40 (H) 07/11/2023 0440   CREATININE 1.42 (H) 09/14/2014 0823   CALCIUM  8.4 (L) 07/11/2023 0440   EGFR 62 01/07/2023 0926   GFRNONAA 54 (L) 07/11/2023 0440    IMAGING past 24 hours CT ANGIO HEAD NECK W WO CM (CODE STROKE) Result Date: 07/10/2023 CLINICAL DATA:  73 year old male code stroke presentation. Left side weakness. EXAM: CT ANGIOGRAPHY HEAD AND NECK TECHNIQUE: Multidetector CT imaging of the head and neck was performed using the standard protocol during bolus administration of intravenous contrast. Multiplanar CT image reconstructions and MIPs were obtained to evaluate the vascular  anatomy. Carotid stenosis measurements (when applicable) are obtained utilizing NASCET criteria, using the distal internal carotid diameter as the denominator. RADIATION DOSE REDUCTION: This exam was performed according to the departmental dose-optimization program which includes automated exposure control, adjustment of the mA and/or kV according to patient size and/or use of iterative reconstruction technique. CONTRAST:  75mL OMNIPAQUE  IOHEXOL  350 MG/ML SOLN COMPARISON:  Plain head CT 1154 hours today. FINDINGS: CTA NECK Skeleton: Bulky Diffuse idiopathic skeletal hyperostosis (DISH). Large cervical anterior endplate osteophytes throughout. Associated interbody ankylosis C5-C6 and C6-C7. No acute osseous abnormality identified. Upper chest: Emphysema and apical lung scarring. Other neck: Nonvascular neck soft tissue spaces appear within normal limits. Aortic arch: 3 vessel arch. Minimal arch atherosclerosis is visible. Right carotid system: Patent with no significant atherosclerosis in the neck. Moderately tortuous right ICA distal to the bulb. No stenosis. Left carotid system: Patent with mild calcified plaque at the lateral left ICA origin. No stenosis. Moderate left ICA tortuosity just below the skull base. Vertebral arteries: Mild proximal right subclavian artery plaque without stenosis. Normal right vertebral artery origin. Tortuous right V1 segment. Mildly tortuous right vertebral artery otherwise to the skull base with no plaque or stenosis. Proximal left subclavian artery minimal plaque and no stenosis. Normal left vertebral artery origin. Non dominant left vertebral artery is patent to the skull base with no significant plaque or stenosis.  CTA HEAD Posterior circulation: Patent distal vertebral arteries and vertebrobasilar junction. Calcified V4 segment plaque bilaterally but no stenosis. Dominant right V4. Patent basilar artery without stenosis. Patent dominant appearing AICAs, patent SCA and PCA  origins. Posterior communicating arteries are diminutive or absent. Bilateral PCA branches are patent with mild tortuosity, no stenosis. Anterior circulation: Bilateral ICA siphons are patent. Left siphon moderate calcified plaque. Moderate supraclinoid left ICA stenosis on series 5, image 253. Contralateral right siphon similar extensive cavernous through anterior genu segment plaque with moderate stenosis on series 5, image 265. Patent carotid termini. MCA and ACA origins appear normal. Tortuous A1 segments. Diminutive or absent anterior communicating artery. Bilateral ACA branches are within normal limits. Left MCA M1 segment bifurcates early without stenosis. Right MCA M1 segment bifurcates without stenosis. Right MCA branches are within normal limits. Left MCA branches appear only mildly hypertrophied, but there is extensive asymmetric early venous enhancement along the periphery of the left hemisphere with tortuous and enlarged draining cortical veins (series 5 image 206). These both tract toward the superior sagittal sinus and drain into the left sphenoid 0 parietal sinus. There is no early cavernous sinus enhancement. And the finding is somewhat diffuse, lacking a discrete nidus although the left sylvian fissure may be the epicenter. Similarly, the left ICA siphon nor the left MCA branch caliber seem enlarged. No arterial aneurysm is identified. Venous sinuses: Patent with abnormal left hemisphere venous structures as above. Anatomic variants: Dominant right vertebral artery. Review of the MIP images confirms the above findings IMPRESSION: 1. Negative for large vessel occlusion. 2. 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 Neuro endovascular consultation is suggested to evaluate the appropriateness of potential treatment. 3. No significant extracranial  atherosclerosis or stenosis. Intracranial atherosclerosis primarily limited to the ICA siphons where there is up to moderate bilateral siphon stenosis due to calcified plaque. 4.  Aortic Atherosclerosis (ICD10-I70.0). 5.  Emphysema (ICD10-J43.9). Electronically Signed   By: Marlise Simpers M.D.   On: 07/10/2023 13:13    Vitals:   07/11/23 0900 07/11/23 1000 07/11/23 1100 07/11/23 1158  BP: 124/70 (!) 112/59 131/85   Pulse: (!) 58 62 67   Resp: (!) 22 20 18    Temp:    97.7 F (36.5 C)  TempSrc:    Axillary  SpO2: 95% 95% 100%   Weight:         PHYSICAL EXAM General:  Alert, well-nourished, well-developed patient in no acute distress Psych:  Mood and affect appropriate for situation CV: Regular rate and rhythm on monitor Respiratory:  Regular, unlabored respirations on room air GI: Abdomen soft and nontender   NEURO:  Mental Status: AA&Ox3, patient is able to give clear and coherent history Speech/Language: speech is without dysarthria or aphasia.    Cranial Nerves:  II: PERRL. Visual fields full.  III, IV, VI: EOMI. Eyelids elevate symmetrically.  V: Sensation is intact to light touch and symmetrical to face.  VII: Face is symmetrical resting and smiling VIII: hearing intact to voice. IX, X: Palate elevates symmetrically. Phonation is normal.  XB:JYNWGNFA shrug 5/5. XII: tongue is midline without fasciculations. Motor: 5/5 strength to all muscle groups tested.  Tone: is normal and bulk is normal Sensation- Intact to light touch bilaterally.  Coordination: FTN intact bilaterally Gait- deferred  Most Recent NIH 0  ASSESSMENT/PLAN  [name] is  a 73 y.o. male with history of hyperlipidemia, hypertension and CAD admitted for acute onset left arm numbness weakness and incoordination.  He presented to the [facility] emergency department and was given TNK to treat possible stroke then transferred here for further care.  MRI reveals acute ischemic infarct in posterior right MCA  territory.  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 Small vessel disease. ASPECTS 10.    CTA head & neck no LVO, left-sided dural venous fistula MRI acute infarct in posterior right MCA territory 2D Echo pending LDL 39 HgbA1c 5.3 VTE prophylaxis -SCDs aspirin  81 mg daily prior to admission, now on No antithrombotic he is less than 24 hours from TNK administration Therapy recommendations:  Pending Disposition: Pending, likely home  Hx of Stroke/TIA Patient has multiple chronic infarcts seen on CT and MRI  Hypertension Home meds: Amlodipine  5 mg daily Stable Blood Pressure Goal: BP less than 180/105   Hyperlipidemia Home meds: Rosuvastatin  10 mg daily, resumed in hospital LDL 39, goal < 70 High intensity statin not indicated as LDL below goal Continue statin at discharge  Left-sided AVM, 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 Shawon Denzer E Bucky Cardinal , MSN, AGACNP-BC [facility] See Amion for schedule and pager information 07/11/2023 12:46 PM   To contact Stroke Continuity provider, please refer to [email or web address]. After hours, contact General Neurology

## 2023-07-11 NOTE — Consult Note (Addendum)
 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 male with history of coronary artery disease, dyslipidemia, hypertension, NSTEMI, tobacco use, and right MCA infarction. Pt presented to the emergency department 07/10/23 with left arm and leg weakness and tingling.  Workup included CTA head and neck as well as MR Brain revealing acute posterior right middle cerebral artery territory infarct, affecting the post sensory gyrus and associated white matter tracks. Pt was subsequently admitted to stroke service.   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    Interventional radiology was consulted for possible image guided arteriovenous malformation treatment.  Patient is unaware of this finding and does not describe any clear clinical symptoms related to this finding.    Patient is Full Code  Past Medical History:  Diagnosis Date   Bradycardia 03/18/2012   Coronary artery disease    Dyslipidemia    Family history of heart disease    History of nuclear stress test 07/13/2008   dipyridamole; normal perfusion, no ischemia, low risk    Hypertension    NSTEMI (non-ST elevated myocardial infarction) (HCC)    h/o   Tobacco abuse     Past Surgical History:  Procedure Laterality Date   CARDIAC CATHETERIZATION  04/2001   after high lateral MI, preserved LV function w/mild mid anterolateral hypokinesia, diffuse coronary irregularity with signficiant  obstruction in LAD, OM1, superior branch of ramus (Dr. Adriana Hopping - [facility])   CARDIAC CATHETERIZATION  09/23/2001   patent stents from 06/2001 (Dr. Jammie Mccune)   CARDIAC CATHETERIZATION  09/17/2002   no restenosis at prior intervention sites (Dr. Electa Grieve)   CARDIAC CATHETERIZATION  09/09/2009   no significant CAD, patent stents in OM1 & LAD (Dr. Electa Grieve)    COLONOSCOPY N/A 02/25/2017   Procedure: COLONOSCOPY;  Surgeon: Alyce Jubilee, MD;  Location: [facility];  Service: Endoscopy;  Laterality: N/A;  10:30 Am   COLONOSCOPY WITH PROPOFOL  N/A 04/15/2020   Procedure: COLONOSCOPY WITH PROPOFOL ;  Surgeon: Vinetta Greening, DO;  Location: [facility];  Service: Endoscopy;  Laterality: N/A;  pm ASA II/   CORONARY ANGIOPLASTY WITH STENT PLACEMENT  06/19/2001   Cfx (3.5x72mm Zeta stent) & diagonal stenting (2.5x81mm pixel stent) w/moderate LAD disease (Dr. Electa Grieve)    CORONARY ANGIOPLASTY WITH STENT PLACEMENT  03/17/2012   Xience DES (2.25x3mm) of mid-distal LAD (Dr. Electa Grieve)   CORONARY STENT INTERVENTION N/A 06/26/2022   Procedure: CORONARY STENT INTERVENTION;  Surgeon: Swaziland, Velisa Regnier M, MD;  Location: [facility] INVASIVE CV LAB;  Service: Cardiovascular;  Laterality: N/A;   LEFT HEART CATH AND CORONARY ANGIOGRAPHY N/A 06/26/2022   Procedure: LEFT HEART CATH AND CORONARY ANGIOGRAPHY;  Surgeon: Swaziland, Roye Gustafson M, MD;  Location: [facility] INVASIVE CV LAB;  Service: Cardiovascular;  Laterality: N/A;   LEFT HEART CATHETERIZATION WITH CORONARY ANGIOGRAM N/A 03/17/2012   Procedure: LEFT HEART CATHETERIZATION WITH CORONARY ANGIOGRAM;  Surgeon: Hazle Lites, MD;  Location: [facility] CATH LAB;  Service: Cardiovascular;  Laterality: N/A;   PERCUTANEOUS CORONARY STENT INTERVENTION (PCI-S)  03/17/2012   Procedure: PERCUTANEOUS CORONARY STENT INTERVENTION (PCI-S);  Surgeon: Hazle Lites, MD;  Location: [facility] CATH LAB;  Service: Cardiovascular;;   POLYPECTOMY  02/25/2017   Procedure: POLYPECTOMY;  Surgeon:  Alyce Jubilee, MD;  [facility]: [facility];  Service: Endoscopy;;  cecal x2; ascending x4;hepatic flexure;splenic flexure; descending x2;   POLYPECTOMY  04/15/2020   Procedure: POLYPECTOMY;  Surgeon: Vinetta Greening, DO;  Location: [facility];  Service: Endoscopy;;  cecal    TRANSTHORACIC ECHOCARDIOGRAM  03/29/2006   EF normal; mild MR & TR; AV mildly sclerotic    Allergies: Patient has no known allergies.  Medications: Prior to Admission medications   Medication Sig Start Date End Date Taking? Authorizing Provider  acetaminophen  (TYLENOL ) 500 MG tablet Take 500 mg by mouth every 6 (six) hours as needed for moderate pain.   Yes [provider]  albuterol  (VENTOLIN  HFA) 108 (90 Base) MCG/ACT inhaler Inhale 2 puffs into the lungs every 4 (four) hours as needed for wheezing or shortness of breath. 09/16/19  Yes [provider]  amLODipine  (NORVASC ) 5 MG tablet Take 5 mg by mouth daily. 09/27/21  Yes [provider]  aspirin  EC 81 MG tablet Take 81 mg by mouth daily.   Yes [provider]  doxycycline  (VIBRAMYCIN ) 100 MG capsule Take 1 capsule (100 mg total) by mouth 2 (two) times daily. 06/18/23  Yes McKenzie, Arden Beck, MD  loratadine  (CLARITIN ) 10 MG tablet Take 10 mg by mouth daily.   Yes [provider]  nitroGLYCERIN  (NITROSTAT ) 0.4 MG SL tablet Place 1 tablet (0.4 mg total) under the tongue every 5 (five) minutes as needed for chest pain (up to 3 doses). 06/27/22  Yes Dunn, Dayna N, PA-C  rosuvastatin  (CRESTOR ) 10 MG tablet Take 10 mg by mouth daily. 06/07/23  Yes [provider]  tadalafil (CIALIS) 20 MG tablet SMARTSIG:1 Tablet(s) By Mouth 06/03/23  Yes [provider]  tamsulosin  (FLOMAX ) 0.4 MG CAPS capsule Take 1 capsule (0.4 mg total) by mouth in the morning and at bedtime. 06/19/23  Yes McKenzie, Arden Beck, MD  rosuvastatin  (CRESTOR ) 20 MG tablet Take 1 tablet (20 mg total) by mouth daily. 12/13/22 03/13/23  HiltyAviva Lemmings,  MD     Family History  Problem Relation Age of Onset   Heart disease Brother        x3   Stroke Father     Social History   Socioeconomic History   Marital status: Married    Spouse name: Not on file   Number of children: 1   Years of education: Not on file   Highest education level: Not on file  Occupational History   Not on file  Tobacco Use   Smoking status: Every Day    Current packs/day: 0.10    Average packs/day: 0.1 packs/day for 40.0 years (4.0 ttl pk-yrs)    Types: Cigarettes   Smokeless tobacco: Never   Tobacco comments:    smokes 3-4 cigarettes daily ("it all depends") 10/27/13  Vaping Use   Vaping status: Never Used  Substance and Sexual Activity   Alcohol use: No   Drug use: No   Sexual activity: Not on file  Other Topics Concern   Not on file  Social History Narrative   Not on file   Social Drivers of Health   Financial Resource Strain: Not on file  Food Insecurity: No Food Insecurity (07/10/2023)   Hunger Vital Sign    Worried About Running  Out of Food in the Last Year: Never true    Ran Out of Food in the Last Year: Never true  Transportation Needs: No Transportation Needs (07/10/2023)   PRAPARE - Administrator, Civil Service (Medical): No    Lack of Transportation (Non-Medical): No  Physical Activity: Not on file  Stress: Not on file  Social Connections: Moderately Integrated (07/10/2023)   Social Connection and Isolation Panel [NHANES]    Frequency of Communication with Friends and Family: More than three times a week    Frequency of Social Gatherings with Friends and Family: More than three times a week    Attends Religious Services: More than 4 times per year    Active Member of Clubs or Organizations: No    Attends Engineer, structural: Never    Marital Status: Married   Vital Signs: BP 121/64   Pulse 61   Temp 97.7 F (36.5 C) (Axillary)   Resp 18   Wt 151 lb 14.4 oz (68.9 kg)   SpO2 99%   BMI 20.60 kg/m    On exam, patient is awake and alert RRR Nonlabored Non-distended NIHSS = 0   Imaging: VAS US  LOWER EXTREMITY VENOUS (DVT) Result Date: 07/11/2023  Lower Venous DVT Study Patient Name:  Robert Shaw  Date of Exam:   07/11/2023 Medical Rec #: [id number]       Accession #:    [id number] Date of Birth: 05-24-1950       Patient Gender: M Patient Age:   46 years Exam Location:  [facility] Procedure:      VAS US  LOWER EXTREMITY VENOUS (DVT) Referring Phys: Margart Shears DE LA TORRE --------------------------------------------------------------------------------  Indications: Stroke.  Risk Factors: None identified. Comparison Study: No prior studies. Performing Technologist: Lerry Ransom RVT  Examination Guidelines: A complete evaluation includes B-mode imaging, spectral Doppler, color Doppler, and power Doppler as needed of all accessible portions of each vessel. Bilateral testing is considered an integral part of a complete examination. Limited examinations for reoccurring indications may be performed as noted. The reflux portion of the exam is performed with the patient in reverse Trendelenburg.  +---------+---------------+---------+-----------+----------+--------------+ RIGHT    CompressibilityPhasicitySpontaneityPropertiesThrombus Aging +---------+---------------+---------+-----------+----------+--------------+ CFV      Full           Yes      Yes                                 +---------+---------------+---------+-----------+----------+--------------+ SFJ      Full                                                        +---------+---------------+---------+-----------+----------+--------------+ FV Prox  Full                                                        +---------+---------------+---------+-----------+----------+--------------+ FV Mid   Full                                                         +---------+---------------+---------+-----------+----------+--------------+  FV DistalFull                                                        +---------+---------------+---------+-----------+----------+--------------+ PFV      Full                                                        +---------+---------------+---------+-----------+----------+--------------+ POP      Full           Yes      Yes                                 +---------+---------------+---------+-----------+----------+--------------+ PTV      Full                                                        +---------+---------------+---------+-----------+----------+--------------+ PERO     Full                                                        +---------+---------------+---------+-----------+----------+--------------+   +---------+---------------+---------+-----------+----------+--------------+ LEFT     CompressibilityPhasicitySpontaneityPropertiesThrombus Aging +---------+---------------+---------+-----------+----------+--------------+ CFV      Full           Yes      Yes                                 +---------+---------------+---------+-----------+----------+--------------+ SFJ      Full                                                        +---------+---------------+---------+-----------+----------+--------------+ FV Prox  Full                                                        +---------+---------------+---------+-----------+----------+--------------+ FV Mid   Full                                                        +---------+---------------+---------+-----------+----------+--------------+ FV DistalFull                                                        +---------+---------------+---------+-----------+----------+--------------+  PFV      Full                                                         +---------+---------------+---------+-----------+----------+--------------+ POP      Full           Yes      Yes                                 +---------+---------------+---------+-----------+----------+--------------+ PTV      Full                                                        +---------+---------------+---------+-----------+----------+--------------+ PERO     Full                                                        +---------+---------------+---------+-----------+----------+--------------+     Summary: RIGHT: - There is no evidence of deep vein thrombosis in the lower extremity.  - No cystic structure found in the popliteal fossa.  LEFT: - There is no evidence of deep vein thrombosis in the lower extremity.  - No cystic structure found in the popliteal fossa.  *See table(s) above for measurements and observations.    Preliminary    ECHOCARDIOGRAM COMPLETE Result Date: 07/11/2023    ECHOCARDIOGRAM REPORT   Patient Name:   Robert Shaw Date of Exam: 07/11/2023 Medical Rec #:  [id number]      Height:       72.0 in Accession #:    [id number]     Weight:       151.9 lb Date of Birth:  1950/08/08      BSA:          1.895 m Patient Age:    72 years       BP:           122/59 mmHg Patient Gender: M              HR:           57 bpm. Exam Location:  Inpatient Procedure: 2D Echo, Cardiac Doppler and Color Doppler (Both Spectral and Color            Flow Doppler were utilized during procedure). Indications:    Stroke  History:        Patient has prior history of Echocardiogram examinations, most                 recent 06/26/2022. Risk Factors:Hypertension.  Sonographer:    Janette Medley Referring Phys: ERIN C LEHNER IMPRESSIONS  1. Left ventricular ejection fraction, by estimation, is 60 to 65%. The left ventricle has normal function. The left ventricle has no regional wall motion abnormalities. Left ventricular diastolic parameters were normal.  2. Right ventricular systolic function is  normal. The right ventricular size is normal. Tricuspid regurgitation signal is inadequate for assessing PA pressure.  3.  The mitral valve is normal in structure. Trivial mitral valve regurgitation. No evidence of mitral stenosis.  4. The aortic valve was not well visualized. Aortic valve regurgitation is trivial. Aortic valve sclerosis/calcification is present, without any evidence of aortic stenosis.  5. The inferior vena cava is normal in size with greater than 50% respiratory variability, suggesting right atrial pressure of 3 mmHg. FINDINGS  Left Ventricle: Left ventricular ejection fraction, by estimation, is 60 to 65%. The left ventricle has normal function. The left ventricle has no regional wall motion abnormalities. The left ventricular internal cavity size was normal in size. There is  no left ventricular hypertrophy. Left ventricular diastolic parameters were normal. Normal left ventricular filling pressure. Right Ventricle: The right ventricular size is normal. No increase in right ventricular wall thickness. Right ventricular systolic function is normal. Tricuspid regurgitation signal is inadequate for assessing PA pressure. Left Atrium: Left atrial size was normal in size. Right Atrium: Right atrial size was normal in size. Pericardium: There is no evidence of pericardial effusion. Mitral Valve: The mitral valve is normal in structure. Trivial mitral valve regurgitation. No evidence of mitral valve stenosis. Tricuspid Valve: The tricuspid valve is normal in structure. Tricuspid valve regurgitation is trivial. No evidence of tricuspid stenosis. Aortic Valve: The aortic valve was not well visualized. Aortic valve regurgitation is trivial. Aortic valve sclerosis/calcification is present, without any evidence of aortic stenosis. Pulmonic Valve: The pulmonic valve was normal in structure. Pulmonic valve regurgitation is not visualized. No evidence of pulmonic stenosis. Aorta: The aortic root is normal in  size and structure. Venous: The inferior vena cava is normal in size with greater than 50% respiratory variability, suggesting right atrial pressure of 3 mmHg. IAS/Shunts: No atrial level shunt detected by color flow Doppler.  LEFT VENTRICLE PLAX 2D LVIDd:         4.10 cm   Diastology LVIDs:         2.60 cm   LV e' medial:    8.81 cm/s LV PW:         1.00 cm   LV E/e' medial:  7.0 LV IVS:        1.10 cm   LV e' lateral:   10.40 cm/s LVOT diam:     2.00 cm   LV E/e' lateral: 5.9 LV SV:         76 LV SV Index:   40 LVOT Area:     3.14 cm  RIGHT VENTRICLE             IVC RV S prime:     15.20 cm/s  IVC diam: 2.00 cm TAPSE (M-mode): 2.8 cm LEFT ATRIUM             Index        RIGHT ATRIUM           Index LA diam:        2.60 cm 1.37 cm/m   RA Area:     11.50 cm LA Vol (A2C):   14.8 ml 7.81 ml/m   RA Volume:   22.20 ml  11.71 ml/m LA Vol (A4C):   25.9 ml 13.66 ml/m LA Biplane Vol: 20.1 ml 10.60 ml/m  AORTIC VALVE LVOT Vmax:   104.00 cm/s LVOT Vmean:  62.400 cm/s LVOT VTI:    0.243 m  AORTA Ao Root diam: 3.30 cm Ao Asc diam:  3.60 cm MITRAL VALVE MV Area (PHT): 3.21 cm    SHUNTS MV Decel Time: 236 msec  Systemic VTI:  0.24 m MV E velocity: 61.80 cm/s  Systemic Diam: 2.00 cm MV A velocity: 79.10 cm/s MV E/A ratio:  0.78 Gaylyn Keas MD Electronically signed by Gaylyn Keas MD Signature Date/Time: 07/11/2023/1:33:38 PM    Final    MR BRAIN WO CONTRAST Result Date: 07/11/2023 CLINICAL DATA:  73 year old male code stroke presentation yesterday. 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 EXAM: MRI HEAD WITHOUT CONTRAST TECHNIQUE: Multiplanar, multiecho pulse sequences of the brain and surrounding structures were obtained without intravenous contrast. COMPARISON:  Head CT, CTA head and neck yesterday. FINDINGS: Brain: Positive for superior right perirolandic cortical and subcortical white matter restricted diffusion in an area of about 2.5 cm (series 2, image 45). This appears to be the post sensory  gyrus. Linear extension of restricted diffusion from there into the posterior right centrum semiovale, toward the posterior right corona radiata (series 3, image 13). T2 and FLAIR hyperintense cytotoxic edema. No hemorrhage or mass effect. No other convincing diffusion restriction. Midline shift, mass effect, evidence of mass lesion, ventriculomegaly, extra-axial collection or acute intracranial hemorrhage. Cervicomedullary junction and pituitary are within normal limits. Patchy, scattered bilateral cerebral white matter T2 and FLAIR hyperintensity in both hemispheres. Small area of chronic cortical encephalomalacia in the left occipital pole (series 6 image 14) possibly with mild hemosiderin there. No other chronic cerebral blood products identified. T2 heterogeneity in the bilateral deep gray nuclei appears to be largely perivascular space related. Brainstem and cerebellum appear negative. Vascular: Major intracranial vascular flow voids are preserved. Redemonstrated asymmetric and abnormal flow voids along the periphery of the left cerebral hemisphere which largely appear to be abnormal draining veins (series 5, image 15). As on the CTA, difficult to identify any specific arterial nidus associated with this finding. Skull and upper cervical spine: Partially visible bulky cervical spine Diffuse idiopathic skeletal hyperostosis (DISH). Normal background bone Shaw signal. Sinuses/Orbits: Negative. Other: Grossly normal visible internal auditory structures. IMPRESSION: 1. Positive for Acute posterior Right MCA territory infarct, affecting the post sensory gyrus and associated white matter tracks. No hemorrhage or mass effect. 2. 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 See CTA findings and recommendation yesterday. 3. Small chronic infarct in the left occipital pole. Electronically  Signed   By: Marlise Simpers M.D.   On: 07/11/2023 12:37   CT ANGIO HEAD NECK W WO CM (CODE STROKE) Result Date: 07/10/2023 CLINICAL DATA:  73 year old male code stroke presentation. Left side weakness. EXAM: CT ANGIOGRAPHY HEAD AND NECK TECHNIQUE: Multidetector CT imaging of the head and neck was performed using the standard protocol during bolus administration of intravenous contrast. Multiplanar CT image reconstructions and MIPs were obtained to evaluate the vascular anatomy. Carotid stenosis measurements (when applicable) are obtained utilizing NASCET criteria, using the distal internal carotid diameter as the denominator. RADIATION DOSE REDUCTION: This exam was performed according to the departmental dose-optimization program which includes automated exposure control, adjustment of the mA and/or kV according to patient size and/or use of iterative reconstruction technique. CONTRAST:  75mL OMNIPAQUE  IOHEXOL  350 MG/ML SOLN COMPARISON:  Plain head CT 1154 hours today. FINDINGS: CTA NECK Skeleton: Bulky Diffuse idiopathic skeletal hyperostosis (DISH). Large cervical anterior endplate osteophytes throughout. Associated interbody ankylosis C5-C6 and C6-C7. No acute osseous abnormality identified. Upper chest: Emphysema and apical lung scarring. Other neck: Nonvascular neck soft tissue spaces appear within normal limits. Aortic arch: 3 vessel arch. Minimal arch atherosclerosis is visible. Right  carotid system: Patent with no significant atherosclerosis in the neck. Moderately tortuous right ICA distal to the bulb. No stenosis. Left carotid system: Patent with mild calcified plaque at the lateral left ICA origin. No stenosis. Moderate left ICA tortuosity just below the skull base. Vertebral arteries: Mild proximal right subclavian artery plaque without stenosis. Normal right vertebral artery origin. Tortuous right V1 segment. Mildly tortuous right vertebral artery otherwise to the skull base with no plaque or stenosis.  Proximal left subclavian artery minimal plaque and no stenosis. Normal left vertebral artery origin. Non dominant left vertebral artery is patent to the skull base with no significant plaque or stenosis. CTA HEAD Posterior circulation: Patent distal vertebral arteries and vertebrobasilar junction. Calcified V4 segment plaque bilaterally but no stenosis. Dominant right V4. Patent basilar artery without stenosis. Patent dominant appearing AICAs, patent SCA and PCA origins. Posterior communicating arteries are diminutive or absent. Bilateral PCA branches are patent with mild tortuosity, no stenosis. Anterior circulation: Bilateral ICA siphons are patent. Left siphon moderate calcified plaque. Moderate supraclinoid left ICA stenosis on series 5, image 253. Contralateral right siphon similar extensive cavernous through anterior genu segment plaque with moderate stenosis on series 5, image 265. Patent carotid termini. MCA and ACA origins appear normal. Tortuous A1 segments. Diminutive or absent anterior communicating artery. Bilateral ACA branches are within normal limits. Left MCA M1 segment bifurcates early without stenosis. Right MCA M1 segment bifurcates without stenosis. Right MCA branches are within normal limits. Left MCA branches appear only mildly hypertrophied, but there is extensive asymmetric early venous enhancement along the periphery of the left hemisphere with tortuous and enlarged draining cortical veins (series 5 image 206). These both tract toward the superior sagittal sinus and drain into the left sphenoid 0 parietal sinus. There is no early cavernous sinus enhancement. And the finding is somewhat diffuse, lacking a discrete nidus although the left sylvian fissure may be the epicenter. Similarly, the left ICA siphon nor the left MCA branch caliber seem enlarged. No arterial aneurysm is identified. Venous sinuses: Patent with abnormal left hemisphere venous structures as above. Anatomic variants:  Dominant right vertebral artery. Review of the MIP images confirms the above findings IMPRESSION: 1. Negative for large vessel occlusion. 2. 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 Neuro endovascular consultation is suggested to evaluate the appropriateness of potential treatment. 3. No significant extracranial atherosclerosis or stenosis. Intracranial atherosclerosis primarily limited to the ICA siphons where there is up to moderate bilateral siphon stenosis due to calcified plaque. 4.  Aortic Atherosclerosis (ICD10-I70.0). 5.  Emphysema (ICD10-J43.9). Electronically Signed   By: Marlise Simpers M.D.   On: 07/10/2023 13:13   CT HEAD CODE STROKE WO CONTRAST Addendum Date: 07/10/2023 ADDENDUM REPORT: 07/10/2023 12:24 ADDENDUM: Study discussed by telephone with Dr. Roxy Cordial on 07/10/2023 at 1213 hours. Electronically Signed   By: Marlise Simpers M.D.   On: 07/10/2023 12:24   Result Date: 07/10/2023 CLINICAL DATA:  Code stroke. 73 year old male with acute left side deficit. EXAM: CT HEAD WITHOUT CONTRAST TECHNIQUE: Contiguous axial images were obtained from the base of the skull through the vertex without intravenous contrast. RADIATION DOSE REDUCTION: This exam was performed according to the departmental dose-optimization program which includes automated exposure control, adjustment of the mA and/or kV according to patient size and/or use of iterative reconstruction technique. COMPARISON:  Head CT 12/16/2014. FINDINGS:  Brain: No midline shift, ventriculomegaly, mass effect, evidence of mass lesion, intracranial hemorrhage or evidence of cortically based acute infarction. Chronic appearing cortical encephalomalacia in the posterosuperior left frontal gyrus (was present in 2016) and also the left superior parietal lobe (new since that time series 2, image 28). Chronic  cortical encephalomalacia in the lateral left occipital pole was present in 2016. Patchy and confluent additional bilateral cerebral white matter hypodensity. Vascular: Calcified atherosclerosis at the skull base. No suspicious intracranial vascular hyperdensity. Skull: Stable, intact. Sinuses/Orbits: Visualized paranasal sinuses and mastoids are clear. Other: No gaze deviation. Visualized orbits and scalp soft tissues are within normal limits. ASPECTS [facility] Stroke Program Early CT Score) Total score (0-10 with 10 being normal): Ten, chronic encephalomalacia. IMPRESSION: 1. No acute cortically based infarct or acute intracranial hemorrhage identified. ASPECTS 10. 2. Moderate for age white matter disease. And chronic appearing cortical infarcts in the left frontal, parietal, and occipital lobes - most present in 2016. Electronically Signed: By: Marlise Simpers M.D. On: 07/10/2023 12:02    Labs:  CBC: Recent Labs    09/11/22 0942 07/10/23 1201 07/10/23 1209 07/11/23 0440  WBC 5.6 7.2  --  5.9  HGB 11.8* 13.2 12.9* 12.1*  HCT 35.4* 37.8* 38.0* 34.7*  PLT 219 173  --  174    COAGS: Recent Labs    07/10/23 1201  INR 1.1  APTT 29    BMP: Recent Labs    01/07/23 0926 04/06/23 0852 07/10/23 1201 07/10/23 1209 07/11/23 0440  NA 143 138 136 139 138  K 4.5 4.8 3.3* 3.4* 3.7  CL 108* 105 103 105 109  CO2 21 25 21*  --  23  GLUCOSE 103* 110* 142* 142* 105*  BUN 9 13 11 9  7*  CALCIUM  8.8 9.3 9.1  --  8.4*  CREATININE 1.25 1.37* 1.44* 1.50* 1.40*  GFRNONAA  --  55* 52*  --  54*    LIVER FUNCTION TESTS: Recent Labs    08/03/22 0816 09/11/22 0942 07/10/23 1201  BILITOT 0.6 0.6 1.3*  AST 19 22 23   ALT 13 14 13   ALKPHOS 87 136* 86  PROT 6.6 6.6 7.3  ALBUMIN 4.2 4.2 4.2    Assessment and Plan:  73 yo male admitted with 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  Left cerebral vascular malformation: Incidental finding without current symptoms  (in setting of recent CVA s/p TNK).  Discussed condition and possible treatment options.  He strongly desires discharge.  Will plan for outpatient follow-up targeting next week with further discussion of treatment options.  Thank you for allowing our service to participate in Robert Shaw 's care.  Electronically Signed: Pasty Bongo, PA-C   07/11/2023, 3:18 PM    I spent a total of 40 Minutes  in face to face in clinical consultation, greater than 50% of which was counseling/coordinating care for image guided arteriovenous malformation embolism.  Attending addendum: Patient seen and examined.  Agree with the above findings, assessment and plan.  Please call with questions, concerns, or change in patient condition.  Thank you for the opportunity to participate in Robert Shaw care.

## 2023-07-11 NOTE — TOC CAGE-AID Note (Signed)
 Transition of Care [facility]) - CAGE-AID Screening   Patient Details  Name: Robert Shaw MRN: [id number] Date of Birth: 07/08/1950  Transition of Care [facility]) CM/SW Contact:    Mamadou Breon E Zharia Conrow, LCSW Phone Number: 07/11/2023, 8:59 AM   Clinical Narrative:    CAGE-AID Screening:    Have You Ever Felt You Ought to Cut Down on Your Drinking or Drug Use?: No Have People Annoyed You By [employer] Your Drinking Or Drug Use?: No Have You Felt Bad Or Guilty About Your Drinking Or Drug Use?: No Have You Ever Had a Drink or Used Drugs First Thing In The Morning to Steady Your Nerves or to Get Rid of a Hangover?: No CAGE-AID Score: 0  Substance Abuse Education Offered: No

## 2023-07-11 NOTE — Consult Note (Signed)
 ELECTROPHYSIOLOGY CONSULT NOTE  Patient ID: Robert Shaw MRN: [id number], DOB/AGE: 06-01-1950   Admit date: 07/10/2023 Date of Consult: 07/11/2023  Primary Physician: Wyvonna Heidelberg, MD Primary Cardiologist: Hazle Lites, MD  Primary Electrophysiologist: New to None  Reason for Consultation: Cryptogenic stroke; recommendations regarding Implantable Loop Recorder Insurance: [facility] Medicare  History of Present Illness: 73 y/o M with PMH of bradycardia, CAD s/p PCI with stents, HLD, HTN, tobacco abuse who was admitted on 07/10/23 with acute onset left arm numbness, weakness and poor coordination.  He presented to [facility] and was given TNK for possible stroke and sent to [facility] for further care.   EP has been asked to evaluate Robert Shaw for placement of an implantable loop recorder to monitor for atrial fibrillation by Dr Christiane Cowing.  The patient was admitted on 07/10/2023 with an acute ischemic infarct in the posterior right MCA territory.    Imaging demonstrated acute infarct in posterior right MCA territory.    He has undergone workup for stroke including:  Code Stroke CT head No acute abnormality. Small vessel disease. ASPECTS 10.    CTA head & neck no LVO, left-sided dural venous fistula MRI acute infarct in posterior right MCA territory 2D Echo 07/11/23 > LVEF 60-65% LDL 39 HgbA1c 5.3 VTE prophylaxis -SCDs aspirin  81 mg daily prior to admission, now on No antithrombotic he is less than 24 hours from TNK administration   The patient has been monitored on telemetry which has demonstrated sinus rhythm with no arrhythmias.  Inpatient stroke work-up will not require a TEE per Neurology.   Echocardiogram as above. Lab work is reviewed.  Prior to admission, the patient denies chest pain, shortness of breath, dizziness, palpitations, or syncope.  He is recovering from his stroke with plans to return home  at discharge.  Allergies, Past Medical, Surgical, Social, and  Family Histories have been reviewed and are referenced here-in when relevant for medical decision making.   Inpatient Medications:   Chlorhexidine Gluconate Cloth  6 each Topical Q0600   feeding supplement  237 mL Oral BID BM   rosuvastatin   10 mg Oral Daily   tamsulosin   0.4 mg Oral BID    Physical Exam: Vitals:   07/11/23 1158 07/11/23 1200 07/11/23 1300 07/11/23 1400  BP:  121/65 106/65 121/64  Pulse:  61 60 61  Resp:  17  18  Temp: 97.7 F (36.5 C)     TempSrc: Axillary     SpO2:  94% 99%   Weight:        GEN- NAD. A&O x 3. Normal affect. HEENT: Normocephalic, atraumatic Lungs- CTAB, Normal effort.  Heart- Regular rate and rhythm rate and rhythm. No M/G/R.  Extremities- No peripheral edema. no clubbing or cyanosis Skin- warm and dry, no rash or lesion. Neuro - AAOx4, speech   12-lead ECG 07/10/23 SR 74 bpm (personally reviewed) All prior EKG's in EPIC reviewed with no documented atrial fibrillation  Telemetry: SB 40-SR 60's, no AF (personally reviewed)  Assessment and Plan:  Cryptogenic Stroke / Acute Ischemic Infarct  R posterior MCA territory s/p TNK The patient presents with cryptogenic stroke.  The patient does not have a TEE planned for this AM.  I spoke at length with the patient about monitoring for afib with an implantable loop recorder.  Risks, benefits, and alteratives to implantable loop recorder were discussed with the patient today.   At this time, the patient is very clear in their decision to  proceed with implantable loop recorder.     Wound care was reviewed with the patient (keep incision clean and dry for 3 days). Please call with questions.      Creighton Doffing, NP-C, AGACNP-BC [facility] - Electrophysiology  07/11/2023, 3:09 PM

## 2023-07-12 ENCOUNTER — Telehealth: Payer: Self-pay

## 2023-07-12 NOTE — Patient Instructions (Signed)
 Visit Information  Thank you for taking time to visit with me today. Please don't hesitate to contact me if I can be of assistance to you before our next scheduled telephone appointment.  Stroke Symptoms Sudden numbness or weakness in the face, arm, or leg, especially on one side of the body. Sudden confusion, trouble speaking, or difficulty understanding speech. Sudden trouble seeing in one or both eyes. Sudden trouble walking, dizziness, loss of balance, or lack of coordination. Sudden severe headache with no known cause. Call 9-1-1 right away if you or someone else has any of these symptoms.   The patient verbalized understanding of instructions, educational materials, and care plan provided today and DECLINED offer to receive copy of patient instructions, educational materials, and care plan.   The patient has been provided with contact information for the care management team and has been advised to call with any health related questions or concerns.   Please call the care guide team at [phone] if you need to cancel or reschedule your appointment.   Please call the Suicide and Crisis Lifeline: 988 if you are experiencing a Mental Health or Behavioral Health Crisis or need someone to talk to.  Aerion Bagdasarian J. Bryauna Byrum RN, MSN [facility], [facility] Health RN Care Manager Direct Dial: [phone]  Fax: [phone] Website: [email or web address]

## 2023-07-12 NOTE — Transitions of Care (Post Inpatient/ED Visit) (Signed)
 07/12/2023  Name: Robert Shaw MRN: [id number] DOB: September 23, 1950  Today's TOC FU Call Status: Today's TOC FU Call Status:: Successful TOC FU Call Completed TOC FU Call Complete Date: 07/12/23 Patient's Name and Date of Birth confirmed.  Transition Care Management Follow-up Telephone Call Date of Discharge: 07/11/23 Discharge Facility: [facility]) Type of Discharge: Inpatient Admission Primary Inpatient Discharge Diagnosis:: Acute Ischemic Infarct: right posterior MCA territory s/p TNK How have you been since you were released from the hospital?: Better Any questions or concerns?: No  Items Reviewed: Did you receive and understand the discharge instructions provided?: Yes Medications obtained,verified, and reconciled?: Yes (Medications Reviewed) Any new allergies since your discharge?: No Dietary orders reviewed?: Yes Type of Diet Ordered:: Heart Healthy Do you have support at home?: Yes People in Home [RPT]: spouse Name of Support/Comfort Primary Source: Newell Bard  Medications Reviewed Today: Medications Reviewed Today     Reviewed by Sarafina Puthoff, RN (Case Manager) on 07/12/23 at 1213  Med List Status: <None>   Medication Order Taking? Sig Documenting Provider Last Dose Status Informant  acetaminophen  (TYLENOL ) 500 MG tablet [id number]  Take 500 mg by mouth every 6 (six) hours as needed for moderate pain. [provider]  Active Spouse/Significant Other, Pharmacy Records  albuterol  (VENTOLIN  HFA) 108 (90 Base) MCG/ACT inhaler [id number] Yes Inhale 2 puffs into the lungs every 4 (four) hours as needed for wheezing or shortness of breath. [provider] Taking Active Spouse/Significant Other, Pharmacy Records  amLODipine  (NORVASC ) 5 MG tablet [id number] Yes Take 5 mg by mouth daily. [provider] Taking Active Spouse/Significant Other, Pharmacy Records  aspirin  EC 81 MG tablet [id number] Yes Take 1 tablet (81 mg total) by mouth daily for 21 days.  Swallow whole. de Thayne Fine, Cortney E, NP Taking Active   clopidogrel  (PLAVIX ) 75 MG tablet [id number] Yes Take 1 tablet (75 mg total) by mouth daily. de Thayne Fine, Cortney E, NP Taking Active   doxycycline  (VIBRAMYCIN ) 100 MG capsule [id number] Yes Take 1 capsule (100 mg total) by mouth 2 (two) times daily. McKenzie, Arden Beck, MD Taking Active Spouse/Significant Other, Pharmacy Records  loratadine  (CLARITIN ) 10 MG tablet [id number] Yes Take 10 mg by mouth daily. [provider] Taking Active Spouse/Significant Other, Pharmacy Records  nitroGLYCERIN  (NITROSTAT ) 0.4 MG SL tablet [id number] Yes Place 1 tablet (0.4 mg total) under the tongue every 5 (five) minutes as needed for chest pain (up to 3 doses). Dunn, Dayna N, PA-C Taking Active Spouse/Significant Other, Pharmacy Records  rosuvastatin  (CRESTOR ) 10 MG tablet [id number] Yes Take 10 mg by mouth daily. [provider] Taking Active Spouse/Significant Other, Pharmacy Records  tamsulosin  (FLOMAX ) 0.4 MG CAPS capsule [id number] Yes Take 1 capsule (0.4 mg total) by mouth in the morning and at bedtime. McKenzie, Arden Beck, MD Taking Active Spouse/Significant Other, Pharmacy Records            Home Care and Equipment/Supplies: Were Home Health Services Ordered?: No Any new equipment or medical supplies ordered?: No  Functional Questionnaire: Do you need assistance with bathing/showering or dressing?: No Do you need assistance with meal preparation?: No Do you need assistance with eating?: No Do you have difficulty maintaining continence: No Do you need assistance with getting out of bed/getting out of a chair/moving?: No Do you have difficulty managing or taking your medications?: Yes (wife manages medication and completes medication reconcillation. No questions.)  Follow up appointments reviewed: PCP Follow-up appointment confirmed?: No (Wife to call for follow up) MD Provider  Line Number:[phone] Given: No Specialist  Hospital Follow-up appointment confirmed?: No Reason Specialist Follow-Up Not Confirmed: Patient has Specialist Provider Number and will Call for Appointment ([facility] follow up for 8 weeks) Do you need transportation to your follow-up appointment?: No Do you understand care options if your condition(s) worsen?: Yes-patient verbalized understanding  SDOH Interventions Today    Flowsheet Row Most Recent Value  SDOH Interventions   Food Insecurity Interventions Intervention Not Indicated  Housing Interventions Intervention Not Indicated  Transportation Interventions Intervention Not Indicated  Utilities Interventions Intervention Not Indicated       Micheil Klaus J. Deran Barro RN, MSN [facility]  [facility], [facility] Health RN Care Manager Direct Dial: [phone]  Fax: [phone] Website: [email or web address]

## 2023-07-16 DIAGNOSIS — F17200 Nicotine dependence, unspecified, uncomplicated: Secondary | ICD-10-CM | POA: Diagnosis not present

## 2023-07-16 DIAGNOSIS — I770 Arteriovenous fistula, acquired: Secondary | ICD-10-CM | POA: Diagnosis not present

## 2023-07-16 DIAGNOSIS — G459 Transient cerebral ischemic attack, unspecified: Secondary | ICD-10-CM | POA: Diagnosis not present

## 2023-07-16 DIAGNOSIS — F17210 Nicotine dependence, cigarettes, uncomplicated: Secondary | ICD-10-CM | POA: Diagnosis not present

## 2023-07-16 DIAGNOSIS — I10 Essential (primary) hypertension: Secondary | ICD-10-CM | POA: Diagnosis not present

## 2023-07-16 DIAGNOSIS — I2510 Atherosclerotic heart disease of native coronary artery without angina pectoris: Secondary | ICD-10-CM | POA: Diagnosis not present

## 2023-07-16 DIAGNOSIS — Z955 Presence of coronary angioplasty implant and graft: Secondary | ICD-10-CM | POA: Diagnosis not present

## 2023-08-12 ENCOUNTER — Ambulatory Visit (INDEPENDENT_AMBULATORY_CARE_PROVIDER_SITE_OTHER)

## 2023-08-12 DIAGNOSIS — I679 Cerebrovascular disease, unspecified: Secondary | ICD-10-CM | POA: Diagnosis not present

## 2023-08-12 DIAGNOSIS — I43 Cardiomyopathy in diseases classified elsewhere: Secondary | ICD-10-CM

## 2023-08-12 LAB — CUP PACEART REMOTE DEVICE CHECK
Date Time Interrogation Session: 20250629235832
Implantable Pulse Generator Implant Date: 20250528

## 2023-08-15 DIAGNOSIS — I10 Essential (primary) hypertension: Secondary | ICD-10-CM | POA: Diagnosis not present

## 2023-08-15 DIAGNOSIS — I2510 Atherosclerotic heart disease of native coronary artery without angina pectoris: Secondary | ICD-10-CM | POA: Diagnosis not present

## 2023-08-18 ENCOUNTER — Ambulatory Visit: Payer: Self-pay | Admitting: Cardiovascular Disease

## 2023-08-21 ENCOUNTER — Telehealth (HOSPITAL_COMMUNITY): Payer: Self-pay

## 2023-08-21 NOTE — Telephone Encounter (Signed)
 Pt is aware that Dr. Dolphus is leaving and will wait and discuss with neurology during that appt. AB

## 2023-08-26 DIAGNOSIS — H40023 Open angle with borderline findings, high risk, bilateral: Secondary | ICD-10-CM | POA: Diagnosis not present

## 2023-08-26 DIAGNOSIS — H2513 Age-related nuclear cataract, bilateral: Secondary | ICD-10-CM | POA: Diagnosis not present

## 2023-09-12 ENCOUNTER — Other Ambulatory Visit

## 2023-09-12 ENCOUNTER — Ambulatory Visit

## 2023-09-12 DIAGNOSIS — I43 Cardiomyopathy in diseases classified elsewhere: Secondary | ICD-10-CM | POA: Diagnosis not present

## 2023-09-12 DIAGNOSIS — I639 Cerebral infarction, unspecified: Secondary | ICD-10-CM | POA: Diagnosis not present

## 2023-09-12 LAB — CUP PACEART REMOTE DEVICE CHECK
Date Time Interrogation Session: 20250730234550
Implantable Pulse Generator Implant Date: 20250528

## 2023-09-12 NOTE — Progress Notes (Signed)
 Carelink Summary Report / Loop Recorder

## 2023-09-15 DIAGNOSIS — I2510 Atherosclerotic heart disease of native coronary artery without angina pectoris: Secondary | ICD-10-CM | POA: Diagnosis not present

## 2023-09-15 DIAGNOSIS — I10 Essential (primary) hypertension: Secondary | ICD-10-CM | POA: Diagnosis not present

## 2023-09-19 ENCOUNTER — Other Ambulatory Visit (HOSPITAL_COMMUNITY): Payer: Self-pay | Admitting: Radiology

## 2023-09-19 ENCOUNTER — Ambulatory Visit: Admitting: Neurology

## 2023-09-19 ENCOUNTER — Telehealth: Payer: Self-pay | Admitting: Neurology

## 2023-09-19 VITALS — BP 147/79 | HR 60 | Ht 72.0 in | Wt 141.0 lb

## 2023-09-19 DIAGNOSIS — Q283 Other malformations of cerebral vessels: Secondary | ICD-10-CM

## 2023-09-19 DIAGNOSIS — I639 Cerebral infarction, unspecified: Secondary | ICD-10-CM | POA: Diagnosis not present

## 2023-09-19 NOTE — Progress Notes (Signed)
 Chief Complaint  Patient presents with   Hospitalization Follow-up    Room 14 pt is here with wife  Hospital follow up  from stroke,  stated he is feeling okay and that he has no  new concerns       ASSESSMENT AND PLAN  Robert Shaw is a 73 y.o. 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   MEDICAL HISTORY:  Robert Shaw, is a 73 year old male, seen in request by his primary care doctor   [name], [name], for evaluation of stroke, initial evaluation September 19, 2023  History is obtained from the patient and review of electronic medical records. I personally reviewed pertinent available imaging films in PACS.   PMHx of  HTN HLD CAD Smoke 1/2 PPD  He has a history of coronary artery disease, taking aspirin  81 mg daily, presenting with sudden onset left arm numbness, weakness on Jul 10 2023, transient,  He was presented at any pain initially, evaluated by teleneurologist Dr. Voncile, received TNK, then transferred to [place]  MRI of brain reviewed acute stroke involving posterior right MCA territory, moderate small vessel disease, there are also abnormal vascularity along the periphery of the left cerebral hemisphere, small chronic infarction at the left occipital lobe  CT angiogram head and neck no large vessel disease, left hemisphere intracranial vascular malformation, was seen by interventional radiologist Dr. Maude Naegeli,  incidental findings without symptoms, suggest outpatient follow-up  He was also seen by vascular neurologist Dr. Jerri, suspicious embolic etiology, had a loop recorder, he was on aspirin  and Plavix  dual agent for 3 weeks now Plavix  alone  A1c 5.3, LDL 39, Echocardiogram normal ejection fraction 60 to 65%   PHYSICAL EXAM:   Vitals:   09/19/23 1106  BP: (!) 147/79  Pulse: 60  SpO2: 98%  Weight: 141 lb (64 kg)  Height: 6' (1.829 m)   Body mass index is 19.12 kg/m.  PHYSICAL EXAMNIATION:  Gen: NAD, conversant, well nourised, well groomed                     Cardiovascular: Regular rate rhythm, no peripheral edema, warm, nontender. Eyes: Conjunctivae clear without exudates or hemorrhage Neck: Supple, no carotid bruits. Pulmonary: Clear to auscultation bilaterally   NEUROLOGICAL EXAM:  MENTAL STATUS: Speech/cognition: Awake, alert, oriented to history taking and casual conversation CRANIAL NERVES: CN II: Visual fields are full to confrontation. Pupils are round equal and briskly reactive to light. CN III, IV, VI: extraocular movement are normal. No ptosis. CN V: Facial sensation is intact to light touch CN VII: Face is symmetric with normal eye closure  CN VIII: Hearing is normal to causal conversation. CN IX, X: Phonation is normal. CN XI: Head turning and shoulder shrug are intact  MOTOR: There is no pronator drift of out-stretched arms. Muscle bulk and tone are normal. Muscle strength is normal.  REFLEXES: Reflexes are 2+ and symmetric at the biceps, triceps, knees, and ankles. Plantar responses are flexor.  SENSORY: Intact to light touch, pinprick and vibratory sensation are intact in fingers and toes.  COORDINATION: There is no trunk or limb dysmetria noted.  GAIT/STANCE: Push-up, cautious  REVIEW OF SYSTEMS:  Full 14 system review of systems performed and notable only for as above All other review of systems were negative.   ALLERGIES: No Known  Allergies  HOME MEDICATIONS: Current Outpatient Medications  Medication Sig Dispense Refill   acetaminophen  (TYLENOL ) 500 MG tablet Take 500 mg by mouth every 6 (six) hours as needed for moderate pain.     albuterol  (VENTOLIN  HFA) 108 (90 Base) MCG/ACT inhaler Inhale 2 puffs into the lungs every 4 (four) hours as needed for wheezing or shortness of breath.     amLODipine  (NORVASC ) 5 MG tablet Take 5 mg by mouth daily.     clopidogrel  (PLAVIX ) 75 MG tablet Take 1 tablet (75 mg total) by mouth daily. 30 tablet 2   loratadine  (CLARITIN ) 10 MG tablet Take 10 mg by mouth daily.     nitroGLYCERIN  (NITROSTAT ) 0.4 MG SL tablet Place 1 tablet (0.4 mg total) under the tongue every 5 (five) minutes as needed for chest pain (up to 3 doses). 25 tablet 3   rosuvastatin  (CRESTOR ) 10 MG tablet Take 10 mg by mouth daily.     tamsulosin  (FLOMAX ) 0.4 MG CAPS capsule Take 1 capsule (0.4 mg total) by mouth in the morning and at bedtime. 60 capsule 11   doxycycline  (VIBRAMYCIN ) 100 MG capsule Take 1 capsule (100 mg total) by mouth 2 (two) times daily. 56 capsule 0   No current facility-administered medications for this visit.    PAST MEDICAL HISTORY: Past Medical History:  Diagnosis Date   Bradycardia 03/18/2012   Coronary artery disease    Dyslipidemia    Family history of heart disease    History of nuclear stress test 07/13/2008   dipyridamole; normal perfusion, no ischemia, low risk    Hypertension    NSTEMI (non-ST elevated myocardial infarction) (HCC)    h/o   Tobacco abuse     PAST SURGICAL HISTORY: Past Surgical History:  Procedure Laterality Date   CARDIAC CATHETERIZATION  04/2001   after high lateral MI, preserved LV function w/mild mid anterolateral hypokinesia, diffuse coronary irregularity with signficiant obstruction in LAD, OM1, superior branch of ramus (Dr. Lupita Pike - [facility])   CARDIAC CATHETERIZATION  09/23/2001   patent stents from 06/2001 (Dr. DOROTHA Schwalbe)   CARDIAC CATHETERIZATION  09/17/2002   no restenosis at prior intervention sites (Dr. IVAR Sor)   CARDIAC CATHETERIZATION  09/09/2009   no significant CAD, patent stents in OM1 & LAD (Dr. IVAR Sor)    COLONOSCOPY N/A 02/25/2017   Procedure: COLONOSCOPY;  Surgeon: Harvey Margo CROME, MD;  [facility];  Service: Endoscopy;  Laterality: N/A;  10:30 Am   COLONOSCOPY WITH PROPOFOL  N/A 04/15/2020   Procedure: COLONOSCOPY WITH PROPOFOL ;  Surgeon: Cindie Carlin POUR, DO;  [facility];  Service: Endoscopy;  Laterality: N/A;  pm ASA II/   CORONARY ANGIOPLASTY WITH STENT PLACEMENT  06/19/2001   Cfx (3.5x81mm Zeta stent) & diagonal stenting (2.5x33mm pixel stent) w/moderate LAD disease (Dr. IVAR Sor)    CORONARY ANGIOPLASTY WITH STENT PLACEMENT  03/17/2012   Xience DES (2.25x72mm) of mid-distal LAD (Dr. IVAR Sor)   CORONARY STENT INTERVENTION N/A 06/26/2022   Procedure: CORONARY STENT INTERVENTION;  Surgeon: Swaziland, Peter M, MD;  Location: [facility] INVASIVE CV LAB;  Service: Cardiovascular;  Laterality:  N/A;   LEFT HEART CATH AND CORONARY ANGIOGRAPHY N/A 06/26/2022   Procedure: LEFT HEART CATH AND CORONARY ANGIOGRAPHY;  Surgeon: Swaziland, Peter M, MD;  Location: [facility] INVASIVE CV LAB;  Service: Cardiovascular;  Laterality: N/A;   LEFT HEART CATHETERIZATION WITH CORONARY ANGIOGRAM N/A 03/17/2012   Procedure: LEFT HEART CATHETERIZATION WITH CORONARY ANGIOGRAM;  Surgeon: Vinie KYM Maxcy, MD;  Location: [facility] CATH LAB;  Service: Cardiovascular;  Laterality: N/A;   LOOP RECORDER INSERTION N/A 07/11/2023   Procedure: LOOP RECORDER INSERTION;  Surgeon: Nancey Eulas BRAVO, MD;  Location: [facility] INVASIVE CV LAB;  Service: Cardiovascular;  Laterality: N/A;   PERCUTANEOUS CORONARY STENT INTERVENTION (PCI-S)  03/17/2012   Procedure: PERCUTANEOUS CORONARY STENT INTERVENTION (PCI-S);  Surgeon: Vinie KYM Maxcy, MD;  Location[facility] CATH LAB;  Service: Cardiovascular;;   POLYPECTOMY  02/25/2017   Procedure: POLYPECTOMY;   Surgeon: Harvey Margo CROME, MD;  [facility];  Service: Endoscopy;;  cecal x2; ascending x4;hepatic flexure;splenic flexure; descending x2;   POLYPECTOMY  04/15/2020   Procedure: POLYPECTOMY;  Surgeon: Cindie Carlin POUR, DO;  [facility];  Service: Endoscopy;;  cecal    TRANSTHORACIC ECHOCARDIOGRAM  03/29/2006   EF normal; mild MR & TR; AV mildly sclerotic    FAMILY HISTORY: Family History  Problem Relation Age of Onset   Heart disease Brother        x3   Stroke Father     SOCIAL HISTORY: Social History   Socioeconomic History   Marital status: Married    Spouse name: Not on file   Number of children: 1   Years of education: Not on file   Highest education level: Not on file  Occupational History   Not on file  Tobacco Use   Smoking status: Every Day    Current packs/day: 0.10    Average packs/day: 0.1 packs/day for 40.0 years (4.0 ttl pk-yrs)    Types: Cigarettes   Smokeless tobacco: Never   Tobacco comments:    smokes 3-4 cigarettes daily (it all depends) 10/27/13  Vaping Use   Vaping status: Never Used  Substance and Sexual Activity   Alcohol use: No   Drug use: No   Sexual activity: Not on file  Other Topics Concern   Not on file  Social History Narrative   Not on file   Social Drivers of Health   Financial Resource Strain: Not on file  Food Insecurity: No Food Insecurity (07/12/2023)   Hunger Vital Sign    Worried About Running Out of Food in the Last Year: Never true    Ran Out of Food in the Last Year: Never true  Transportation Needs: No Transportation Needs (07/12/2023)   PRAPARE - Administrator, Civil Service (Medical): No    Lack of Transportation (Non-Medical): No  Physical Activity: Not on file  Stress: Not on file  Social Connections: Moderately Integrated (07/10/2023)   Social Connection and Isolation Panel    Frequency of Communication with Friends and Family: More than three times a week    Frequency of Social  Gatherings with Friends and Family: More than three times a week    Attends Religious Services: More than 4 times per year    Active Member of [employer] or Organizations: No    Attends Banker Meetings: Never    Marital Status: Married  Catering manager Violence: Not At Risk (07/12/2023)   Humiliation, Afraid, Rape, and Kick questionnaire    Fear of Current or Ex-Partner:  No    Emotionally Abused: No    Physically Abused: No    Sexually Abused: No      Modena Callander, M.D. Ph.D.  [facility] [address], [address] [place], [place] [address] Ph: [phone] Fax: [phone]  CC:  Jerri Pfeiffer, MD [address] [place],  [place] [address]  Carlette Benita Area, MD

## 2023-09-19 NOTE — Telephone Encounter (Signed)
 Referral to Interventional Radiology faxed to Dr. Dolphus at [facility]   Dr. Dolphus at [facility]  Phone [phone] Fax [phone]

## 2023-09-20 ENCOUNTER — Ambulatory Visit: Admitting: Urology

## 2023-09-24 ENCOUNTER — Ambulatory Visit: Payer: Self-pay | Admitting: Cardiovascular Disease

## 2023-09-25 ENCOUNTER — Ambulatory Visit (INDEPENDENT_AMBULATORY_CARE_PROVIDER_SITE_OTHER): Admitting: Neuroradiology

## 2023-09-25 ENCOUNTER — Encounter: Payer: Self-pay | Admitting: Neuroradiology

## 2023-09-25 VITALS — BP 121/81 | HR 82 | Ht 72.0 in | Wt 136.0 lb

## 2023-09-25 DIAGNOSIS — I679 Cerebrovascular disease, unspecified: Secondary | ICD-10-CM

## 2023-09-25 DIAGNOSIS — I671 Cerebral aneurysm, nonruptured: Secondary | ICD-10-CM

## 2023-09-25 NOTE — Progress Notes (Signed)
 Chief Complaint: Patient was seen in consultation today for  Chief Complaint  Patient presents with   Follow-up   Referral    Pt was referred after having a stroke....the patient states he feels good... Pt still puts down floors.SABRA   at the request of Deveshwar,Sanjeev  Referring Physician(s): Deveshwar,Sanjeev  History of Present Illness: Robert Shaw is a 73 y.o. male . He had a small right parietal cortical infarct and has recovered well. No carotid stenosis.  ECHO showed normal EF and no structural lesion.  Loop recorder negative. I reviewed the above.   I reviewed his CTA and MRI from 07/07/23.  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  I don't see a nidus and the left external is markedly enlarged. 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    Past Medical History:  Diagnosis Date   Bradycardia 03/18/2012   Coronary artery disease    Dyslipidemia    Family history of heart disease    History of nuclear stress test 07/13/2008   dipyridamole; normal perfusion, no ischemia, low risk    Hypertension    NSTEMI (non-ST elevated myocardial infarction) (HCC)    h/o   Tobacco abuse     Past Surgical History:  Procedure Laterality Date   CARDIAC CATHETERIZATION  04/2001   after high lateral MI, preserved LV function w/mild mid anterolateral hypokinesia, diffuse coronary irregularity with signficiant obstruction in LAD, OM1, superior branch of ramus (Dr. Lupita Pike - [facility])   CARDIAC CATHETERIZATION  09/23/2001   patent stents from 06/2001 (Dr. DOROTHA Schwalbe)   CARDIAC CATHETERIZATION  09/17/2002   no restenosis at prior intervention sites (Dr. IVAR Sor)   CARDIAC CATHETERIZATION  09/09/2009   no significant CAD, patent stents in OM1 & LAD (Dr. IVAR Sor)    COLONOSCOPY N/A 02/25/2017   Procedure: COLONOSCOPY;  Surgeon: Harvey Margo CROME, MD;  [facility];  Service: Endoscopy;  Laterality: N/A;   10:30 Am   COLONOSCOPY WITH PROPOFOL  N/A 04/15/2020   Procedure: COLONOSCOPY WITH PROPOFOL ;  Surgeon: Cindie Carlin POUR, DO;  [facility];  Service: Endoscopy;  Laterality: N/A;  pm ASA II/   CORONARY ANGIOPLASTY WITH STENT PLACEMENT  06/19/2001   Cfx (3.5x62mm Zeta stent) & diagonal stenting (2.5x34mm pixel stent) w/moderate LAD disease (Dr. IVAR Sor)    CORONARY ANGIOPLASTY WITH STENT PLACEMENT  03/17/2012   Xience DES (2.25x71mm) of mid-distal LAD (Dr. IVAR Sor)   CORONARY STENT INTERVENTION N/A 06/26/2022   Procedure: CORONARY STENT INTERVENTION;  Surgeon: Swaziland, Peter M, MD;  Location: [facility] INVASIVE CV LAB;  Service: Cardiovascular;  Laterality: N/A;   LEFT HEART CATH AND CORONARY ANGIOGRAPHY N/A 06/26/2022   Procedure: LEFT HEART CATH AND CORONARY ANGIOGRAPHY;  Surgeon: Swaziland, Peter M, MD;  Location: [facility] INVASIVE CV LAB;  Service: Cardiovascular;  Laterality: N/A;   LEFT HEART CATHETERIZATION WITH CORONARY ANGIOGRAM N/A 03/17/2012   Procedure: LEFT HEART CATHETERIZATION WITH CORONARY ANGIOGRAM;  Surgeon: Vinie KYM Maxcy, MD;  Location: [facility] CATH LAB;  Service: Cardiovascular;  Laterality: N/A;   LOOP RECORDER INSERTION N/A 07/11/2023   Procedure: LOOP RECORDER INSERTION;  Surgeon: Nancey Eulas FORBES, MD;  Location: [facility] INVASIVE CV LAB;  Service: Cardiovascular;  Laterality: N/A;   PERCUTANEOUS CORONARY STENT INTERVENTION (PCI-S)  03/17/2012   Procedure: PERCUTANEOUS CORONARY STENT INTERVENTION (PCI-S);  Surgeon: Vinie KYM Maxcy, MD;  Location: [facility] CATH LAB;  Service:  Cardiovascular;;   POLYPECTOMY  02/25/2017   Procedure: POLYPECTOMY;  Surgeon: Harvey Margo CROME, MD;  [facility];  Service: Endoscopy;;  cecal x2; ascending x4;hepatic flexure;splenic flexure; descending x2;   POLYPECTOMY  04/15/2020   Procedure: POLYPECTOMY;  Surgeon: Cindie Carlin POUR, DO;  [facility];  Service: Endoscopy;;  cecal    TRANSTHORACIC ECHOCARDIOGRAM  03/29/2006   EF normal; mild MR & TR; AV  mildly sclerotic    Allergies: Patient has no known allergies.  Medications: Prior to Admission medications   Medication Sig Start Date End Date Taking? Authorizing Provider  acetaminophen  (TYLENOL ) 500 MG tablet Take 500 mg by mouth every 6 (six) hours as needed for moderate pain.   Yes [provider]  albuterol  (VENTOLIN  HFA) 108 (90 Base) MCG/ACT inhaler Inhale 2 puffs into the lungs every 4 (four) hours as needed for wheezing or shortness of breath. 09/16/19  Yes [provider]  amLODipine  (NORVASC ) 5 MG tablet Take 5 mg by mouth daily. 09/27/21  Yes [provider]  clopidogrel  (PLAVIX ) 75 MG tablet Take 1 tablet (75 mg total) by mouth daily. 07/11/23  Yes de Clint Kill, Cortney E, NP  doxycycline  (VIBRAMYCIN ) 100 MG capsule Take 1 capsule (100 mg total) by mouth 2 (two) times daily. 06/18/23  Yes McKenzie, Belvie CROME, MD  loratadine  (CLARITIN ) 10 MG tablet Take 10 mg by mouth daily.   Yes [provider]  nitroGLYCERIN  (NITROSTAT ) 0.4 MG SL tablet Place 1 tablet (0.4 mg total) under the tongue every 5 (five) minutes as needed for chest pain (up to 3 doses). 06/27/22  Yes Dunn, Dayna N, PA-C  rosuvastatin  (CRESTOR ) 10 MG tablet Take 10 mg by mouth daily. 06/07/23  Yes [provider]  tadalafil (CIALIS) 20 MG tablet Take 20 mg by mouth daily. 09/03/23  Yes [provider]  tamsulosin  (FLOMAX ) 0.4 MG CAPS capsule Take 1 capsule (0.4 mg total) by mouth in the morning and at bedtime. 06/19/23  Yes McKenzie, Belvie CROME, MD     Family History  Problem Relation Age of Onset   Heart disease Brother        x3   Stroke Father     Social History   Socioeconomic History   Marital status: Married    Spouse name: Not on file   Number of children: 1   Years of education: Not on file   Highest education level: Not on file  Occupational History   Not on file  Tobacco Use   Smoking status: Every Day    Current packs/day: 0.10    Average packs/day:  0.1 packs/day for 40.0 years (4.0 ttl pk-yrs)    Types: Cigarettes   Smokeless tobacco: Never   Tobacco comments:    smokes 3-4 cigarettes daily (it all depends) 10/27/13  Vaping Use   Vaping status: Never Used  Substance and Sexual Activity   Alcohol use: No   Drug use: No   Sexual activity: Not on file  Other Topics Concern   Not on file  Social History Narrative   Not on file   Social Drivers of Health   Financial Resource Strain: Not on file  Food Insecurity: No Food Insecurity (07/12/2023)   Hunger Vital Sign    Worried About Running Out of Food in the Last Year: Never true    Ran Out of Food in the Last Year: Never true  Transportation Needs: No Transportation Needs (07/12/2023)   PRAPARE - Transportation  Lack of Transportation (Medical): No    Lack of Transportation (Non-Medical): No  Physical Activity: Not on file  Stress: Not on file  Social Connections: Moderately Integrated (07/10/2023)   Social Connection and Isolation Panel    Frequency of Communication with Friends and Family: More than three times a week    Frequency of Social Gatherings with Friends and Family: More than three times a week    Attends Religious Services: More than 4 times per year    Active Member of [employer] or Organizations: No    Attends Banker Meetings: Never    Marital Status: Married    Review of Systems  Respiratory:  Negative for shortness of breath.   Cardiovascular:  Negative for chest pain.    Vital Signs: BP 121/81   Pulse 82   Ht 6' (1.829 m)   Wt 136 lb (61.7 kg)   SpO2 94%   BMI 18.44 kg/m   Physical Exam Constitutional:      Appearance: Normal appearance.  Cardiovascular:     Rate and Rhythm: Normal rate and regular rhythm.     Pulses: Normal pulses.     Heart sounds: Normal heart sounds.  Neurological:     Mental Status: He is alert.     Comments: Alert and oriented with normal speech expression, fluency and comprehension. Visual fields are  full to confrontation.   Face is symmetric. Strength in the arms and legs is symmetric with no drift.  Sensation is normal and symmetric. No ataxia. No inattention.      Imaging: CUP PACEART REMOTE DEVICE CHECK Result Date: 09/12/2023 ILR summary report received. Battery status OK. Normal device function. No new symptom, tachy, brady, or pause episodes. No new AF episodes. Monthly summary reports and ROV/PRN LA, CVRS   Labs:  CBC: Recent Labs    07/10/23 1201 07/10/23 1209 07/11/23 0440  WBC 7.2  --  5.9  HGB 13.2 12.9* 12.1*  HCT 37.8* 38.0* 34.7*  PLT 173  --  174    COAGS: Recent Labs    07/10/23 1201  INR 1.1  APTT 29    BMP: Recent Labs    01/07/23 0926 04/06/23 0852 07/10/23 1201 07/10/23 1209 07/11/23 0440  NA 143 138 136 139 138  K 4.5 4.8 3.3* 3.4* 3.7  CL 108* 105 103 105 109  CO2 21 25 21*  --  23  GLUCOSE 103* 110* 142* 142* 105*  BUN 9 13 11 9  7*  CALCIUM  8.8 9.3 9.1  --  8.4*  CREATININE 1.25 1.37* 1.44* 1.50* 1.40*  GFRNONAA  --  55* 52*  --  54*    LIVER FUNCTION TESTS: Recent Labs    07/10/23 1201  BILITOT 1.3*  AST 23  ALT 13  ALKPHOS 86  PROT 7.3  ALBUMIN 4.2    TUMOR MARKERS: No results for input(s): AFPTM, CEA, CA199, CHROMGRNA in the last 8760 hours.  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   Plan:  I talked to him about the probable diagnosis. I have explained the risk of bleeding from the AV lesion. I have told him there are two options.  The first is to not do anything.  The second is to do an arteriogram to evaluate the nature of the lesion and to assess treatment options. I have explained that it may be treatable with relatively low risk.   He is going to consider the above and let  me know how he wants to proceed. I will follow up by phone in two weeks.   Electronically Signed: Nancyann LULLA Burns  09/25/2023, 4:08 PM

## 2023-10-08 ENCOUNTER — Other Ambulatory Visit

## 2023-10-08 DIAGNOSIS — C61 Malignant neoplasm of prostate: Secondary | ICD-10-CM

## 2023-10-09 ENCOUNTER — Ambulatory Visit (INDEPENDENT_AMBULATORY_CARE_PROVIDER_SITE_OTHER): Admitting: Neuroradiology

## 2023-10-09 DIAGNOSIS — I671 Cerebral aneurysm, nonruptured: Secondary | ICD-10-CM

## 2023-10-09 LAB — PSA: Prostate Specific Ag, Serum: 17.2 ng/mL — ABNORMAL HIGH (ref 0.0–4.0)

## 2023-10-09 NOTE — Progress Notes (Signed)
 I spoke with Robert Shaw on the phone today.  He has decided that he would like to proceed with the cerebral arteriogram to better define the vascular abnormality in his left cerebral hemisphere, which I think is probably a dural fistula.  I have explained that the procedure would be outpatient with radial artery access.  We will call him to schedule this in the near future.

## 2023-10-11 ENCOUNTER — Ambulatory Visit: Admitting: Internal Medicine

## 2023-10-14 ENCOUNTER — Ambulatory Visit

## 2023-10-14 DIAGNOSIS — I43 Cardiomyopathy in diseases classified elsewhere: Secondary | ICD-10-CM | POA: Diagnosis not present

## 2023-10-14 DIAGNOSIS — I429 Cardiomyopathy, unspecified: Secondary | ICD-10-CM | POA: Diagnosis not present

## 2023-10-15 ENCOUNTER — Other Ambulatory Visit: Payer: Self-pay

## 2023-10-15 ENCOUNTER — Ambulatory Visit: Payer: Self-pay | Admitting: Urology

## 2023-10-15 DIAGNOSIS — I671 Cerebral aneurysm, nonruptured: Secondary | ICD-10-CM

## 2023-10-16 ENCOUNTER — Encounter: Payer: Self-pay | Admitting: Urology

## 2023-10-16 ENCOUNTER — Ambulatory Visit: Admitting: Urology

## 2023-10-16 VITALS — BP 123/63 | HR 75

## 2023-10-16 DIAGNOSIS — I10 Essential (primary) hypertension: Secondary | ICD-10-CM | POA: Diagnosis not present

## 2023-10-16 DIAGNOSIS — R351 Nocturia: Secondary | ICD-10-CM

## 2023-10-16 DIAGNOSIS — I2510 Atherosclerotic heart disease of native coronary artery without angina pectoris: Secondary | ICD-10-CM | POA: Diagnosis not present

## 2023-10-16 DIAGNOSIS — N401 Enlarged prostate with lower urinary tract symptoms: Secondary | ICD-10-CM

## 2023-10-16 DIAGNOSIS — N138 Other obstructive and reflux uropathy: Secondary | ICD-10-CM

## 2023-10-16 DIAGNOSIS — C61 Malignant neoplasm of prostate: Secondary | ICD-10-CM | POA: Diagnosis not present

## 2023-10-16 LAB — URINALYSIS, ROUTINE W REFLEX MICROSCOPIC
Bilirubin, UA: NEGATIVE
Glucose, UA: NEGATIVE
Ketones, UA: NEGATIVE
Nitrite, UA: NEGATIVE
Protein,UA: NEGATIVE
RBC, UA: NEGATIVE
Specific Gravity, UA: 1.015 (ref 1.005–1.030)
Urobilinogen, Ur: 1 mg/dL (ref 0.2–1.0)
pH, UA: 6.5 (ref 5.0–7.5)

## 2023-10-16 LAB — MICROSCOPIC EXAMINATION
Bacteria, UA: NONE SEEN
RBC, Urine: NONE SEEN /HPF (ref 0–2)

## 2023-10-16 LAB — CUP PACEART REMOTE DEVICE CHECK
Date Time Interrogation Session: 20250830232702
Implantable Pulse Generator Implant Date: 20250528

## 2023-10-16 MED ORDER — TAMSULOSIN HCL 0.4 MG PO CAPS: 0.4000 mg | ORAL_CAPSULE | Freq: Two times a day (BID) | ORAL | 11 refills | Status: AC

## 2023-10-16 NOTE — Progress Notes (Signed)
 10/16/2023 1:30 PM   Robert Shaw 1950-05-16 [id number]  Referring provider: Carlette Benita Area, MD [address] [place],  [place] [address]  Followup prostate cancer and BPH   HPI: Robert Shaw is a 73yo here for followup for prostate cancer and BPH. PSa decreased to 17.2 from 33. IPSS 16 QOL 2 on flomax  0.4mg  BID. Uirne stream strong. No straining to urinate. Nocturia 1-3x depending on fluid consumption. No other complaints oday   PMH: Past Medical History:  Diagnosis Date   Bradycardia 03/18/2012   Coronary artery disease    Dyslipidemia    Family history of heart disease    History of nuclear stress test 07/13/2008   dipyridamole; normal perfusion, no ischemia, low risk    Hypertension    NSTEMI (non-ST elevated myocardial infarction) (HCC)    h/o   Tobacco abuse     Surgical History: Past Surgical History:  Procedure Laterality Date   CARDIAC CATHETERIZATION  04/2001   after high lateral MI, preserved LV function w/mild mid anterolateral hypokinesia, diffuse coronary irregularity with signficiant obstruction in LAD, OM1, superior branch of ramus (Dr. Lupita Pike - [facility])   CARDIAC CATHETERIZATION  09/23/2001   patent stents from 06/2001 (Dr. DOROTHA Schwalbe)   CARDIAC CATHETERIZATION  09/17/2002   no restenosis at prior intervention sites (Dr. IVAR Sor)   CARDIAC CATHETERIZATION  09/09/2009   no significant CAD, patent stents in OM1 & LAD (Dr. IVAR Sor)    COLONOSCOPY N/A 02/25/2017   Procedure: COLONOSCOPY;  Surgeon: Harvey Margo CROME, MD;  [facility];  Service: Endoscopy;  Laterality: N/A;  10:30 Am   COLONOSCOPY WITH PROPOFOL  N/A 04/15/2020   Procedure: COLONOSCOPY WITH PROPOFOL ;  Surgeon: Cindie Carlin POUR, DO;  Location: [facility];  Service: Endoscopy;  Laterality: N/A;  pm ASA II/   CORONARY ANGIOPLASTY WITH STENT PLACEMENT  06/19/2001   Cfx (3.5x68mm Zeta stent) & diagonal stenting (2.5x40mm pixel stent) w/moderate  LAD disease (Dr. IVAR Sor)    CORONARY ANGIOPLASTY WITH STENT PLACEMENT  03/17/2012   Xience DES (2.25x32mm) of mid-distal LAD (Dr. IVAR Sor)   CORONARY STENT INTERVENTION N/A 06/26/2022   Procedure: CORONARY STENT INTERVENTION;  Surgeon: Swaziland, Peter M, MD;  Location: [facility] INVASIVE CV LAB;  Service: Cardiovascular;  Laterality: N/A;   LEFT HEART CATH AND CORONARY ANGIOGRAPHY N/A 06/26/2022   Procedure: LEFT HEART CATH AND CORONARY ANGIOGRAPHY;  Surgeon: Swaziland, Peter M, MD;  Location: [facility] INVASIVE CV LAB;  Service: Cardiovascular;  Laterality: N/A;   LEFT HEART CATHETERIZATION WITH CORONARY ANGIOGRAM N/A 03/17/2012   Procedure: LEFT HEART CATHETERIZATION WITH CORONARY ANGIOGRAM;  Surgeon: Vinie KYM Maxcy, MD;  Location: [facility] CATH LAB;  Service: Cardiovascular;  Laterality: N/A;   LOOP RECORDER INSERTION N/A 07/11/2023   Procedure: LOOP RECORDER INSERTION;  Surgeon: Nancey Eulas FORBES, MD;  Location: [facility] INVASIVE CV LAB;  Service: Cardiovascular;  Laterality: N/A;   PERCUTANEOUS CORONARY STENT INTERVENTION (PCI-S)  03/17/2012   Procedure: PERCUTANEOUS CORONARY STENT INTERVENTION (PCI-S);  Surgeon: Vinie KYM Maxcy, MD;  Location: [facility] CATH LAB;  Service: Cardiovascular;;   POLYPECTOMY  02/25/2017   Procedure: POLYPECTOMY;  Surgeon: Harvey Margo CROME, MD;  [facility];  Service: Endoscopy;;  cecal x2; ascending x4;hepatic flexure;splenic flexure; descending x2;   POLYPECTOMY  04/15/2020   Procedure: POLYPECTOMY;  Surgeon: Cindie Carlin POUR, DO;  [facility];  Service: Endoscopy;;  cecal    TRANSTHORACIC ECHOCARDIOGRAM  03/29/2006   EF  normal; mild Robert & TR; AV mildly sclerotic    Home Medications:  Allergies as of 10/16/2023   No Known Allergies      Medication List        Accurate as of October 16, 2023  1:30 PM. If you have any questions, ask your nurse or doctor.          acetaminophen  500 MG tablet Commonly known as: TYLENOL  Take 500 mg by mouth every 6 (six) hours as  needed for moderate pain.   albuterol  108 (90 Base) MCG/ACT inhaler Commonly known as: VENTOLIN  HFA Inhale 2 puffs into the lungs every 4 (four) hours as needed for wheezing or shortness of breath.   amLODipine  5 MG tablet Commonly known as: NORVASC  Take 5 mg by mouth daily.   clopidogrel  75 MG tablet Commonly known as: PLAVIX  Take 1 tablet (75 mg total) by mouth daily.   doxycycline  100 MG capsule Commonly known as: VIBRAMYCIN  Take 1 capsule (100 mg total) by mouth 2 (two) times daily.   loratadine  10 MG tablet Commonly known as: CLARITIN  Take 10 mg by mouth daily.   nitroGLYCERIN  0.4 MG SL tablet Commonly known as: NITROSTAT  Place 1 tablet (0.4 mg total) under the tongue every 5 (five) minutes as needed for chest pain (up to 3 doses).   rosuvastatin  10 MG tablet Commonly known as: CRESTOR  Take 10 mg by mouth daily.   tadalafil 20 MG tablet Commonly known as: CIALIS Take 20 mg by mouth daily.   tamsulosin  0.4 MG Caps capsule Commonly known as: FLOMAX  Take 1 capsule (0.4 mg total) by mouth in the morning and at bedtime.        Allergies: No Known Allergies  Family History: Family History  Problem Relation Age of Onset   Heart disease Brother        x3   Stroke Father     Social History:  reports that he has been smoking cigarettes. He has a 4 pack-year smoking history. He has never used smokeless tobacco. He reports that he does not drink alcohol and does not use drugs.  ROS: All other review of systems were reviewed and are negative except what is noted above in HPI  Physical Exam: BP 123/63   Pulse 75   Constitutional:  Alert and oriented, No acute distress. HEENT: [place] AT, moist mucus membranes.  Trachea midline, no masses. Cardiovascular: No clubbing, cyanosis, or edema. Respiratory: Normal respiratory effort, no increased work of breathing. GI: Abdomen is soft, nontender, nondistended, no abdominal masses GU: No CVA tenderness.  Lymph: No cervical  or inguinal lymphadenopathy. Skin: No rashes, bruises or suspicious lesions. Neurologic: Grossly intact, no focal deficits, moving all 4 extremities. Psychiatric: Normal mood and affect.  Laboratory Data: Lab Results  Component Value Date   WBC 5.9 07/11/2023   HGB 12.1 (L) 07/11/2023   HCT 34.7 (L) 07/11/2023   MCV 89.2 07/11/2023   PLT 174 07/11/2023    Lab Results  Component Value Date   CREATININE 1.40 (H) 07/11/2023    No results found for: PSA  No results found for: TESTOSTERONE  Lab Results  Component Value Date   HGBA1C 5.3 07/11/2023    Urinalysis    Component Value Date/Time   COLORURINE STRAW (A) 04/06/2023 0818   APPEARANCEUR CLEAR 04/06/2023 0818   APPEARANCEUR Clear 01/16/2023 1029   LABSPEC 1.008 04/06/2023 0818   PHURINE 6.0 04/06/2023 0818   GLUCOSEU NEGATIVE 04/06/2023 0818   HGBUR NEGATIVE 04/06/2023 0818  BILIRUBINUR NEGATIVE 04/06/2023 0818   BILIRUBINUR Negative 01/16/2023 1029   KETONESUR NEGATIVE 04/06/2023 0818   PROTEINUR NEGATIVE 04/06/2023 0818   UROBILINOGEN 0.2 06/17/2007 1054   NITRITE NEGATIVE 04/06/2023 0818   LEUKOCYTESUR NEGATIVE 04/06/2023 0818    Lab Results  Component Value Date   LABMICR Comment 01/16/2023   WBCUA >30 (A) 11/23/2022   LABEPIT 0-10 11/23/2022   BACTERIA NONE SEEN 04/06/2023    Pertinent Imaging:  No results found for this or any previous visit.  No results found for this or any previous visit.  No results found for this or any previous visit.  No results found for this or any previous visit.  No results found for this or any previous visit.  No results found for this or any previous visit.  No results found for this or any previous visit.  No results found for this or any previous visit.   Assessment & Plan:    1. Prostate cancer (HCC) (Primary) Followup 6 monhts with PSA - Urinalysis, Routine w reflex microscopic  2. Benign prostatic hyperplasia with urinary  obstruction Continue flomax  0.4mg  BID  3. Nocturia Continue flomax  0.4mg  BID   No follow-ups on file.  Belvie Clara, MD  [facility] 

## 2023-10-16 NOTE — Patient Instructions (Signed)

## 2023-10-17 ENCOUNTER — Telehealth: Payer: Self-pay

## 2023-10-17 ENCOUNTER — Ambulatory Visit: Payer: Self-pay | Admitting: Cardiovascular Disease

## 2023-10-17 NOTE — Patient Outreach (Signed)
 Telephone outreach to patient to obtain mRS was successfully completed. MRS= 0  Shereen Gin [facility] VBCI Assistant Direct Dial: [phone]  Fax: [phone] Website: [email or web address]

## 2023-10-21 ENCOUNTER — Ambulatory Visit (HOSPITAL_COMMUNITY): Admission: RE | Admit: 2023-10-21 | Source: Ambulatory Visit

## 2023-10-21 ENCOUNTER — Telehealth (HOSPITAL_COMMUNITY): Payer: Self-pay

## 2023-10-21 NOTE — Telephone Encounter (Signed)
 Pt will have his wife call back to reschedule angiogram. AB

## 2023-10-22 ENCOUNTER — Telehealth: Payer: Self-pay | Admitting: Neurosurgery

## 2023-10-22 NOTE — Telephone Encounter (Signed)
 Patient's wife called the office today to report that the patient is out of Plavix  x2 days.  Please send in a prescription for Clopidogrel  (Plavix ) 75mg  tablet.  Take 1 tablet daily to the [facility] in [place].  Please call the patient's wife if there are any issues.

## 2023-10-22 NOTE — Progress Notes (Signed)
 Remote Loop Recorder Transmission

## 2023-10-23 ENCOUNTER — Telehealth: Payer: Self-pay

## 2023-10-24 ENCOUNTER — Ambulatory Visit (HOSPITAL_COMMUNITY)

## 2023-10-29 ENCOUNTER — Other Ambulatory Visit: Payer: Self-pay

## 2023-11-04 ENCOUNTER — Other Ambulatory Visit: Payer: Self-pay | Admitting: Neuroradiology

## 2023-11-04 ENCOUNTER — Other Ambulatory Visit: Payer: Self-pay

## 2023-11-04 ENCOUNTER — Ambulatory Visit (HOSPITAL_COMMUNITY)
Admission: RE | Admit: 2023-11-04 | Discharge: 2023-11-04 | Disposition: A | Discharge status: Home / Self Care | Source: Ambulatory Visit | Attending: Neuroradiology | Admitting: Neuroradiology

## 2023-11-04 DIAGNOSIS — I671 Cerebral aneurysm, nonruptured: Secondary | ICD-10-CM

## 2023-11-04 DIAGNOSIS — I770 Arteriovenous fistula, acquired: Secondary | ICD-10-CM

## 2023-11-04 HISTORY — PX: IR ANGIO VERTEBRAL SEL VERTEBRAL UNI R MOD SED: IMG5368

## 2023-11-04 HISTORY — PX: IR ANGIO INTRA EXTRACRAN SEL COM CAROTID INNOMINATE BILAT MOD SED: IMG5360

## 2023-11-04 HISTORY — PX: IR US GUIDE VASC ACCESS RIGHT: IMG2390

## 2023-11-04 LAB — CBC
HCT: 37.8 % — ABNORMAL LOW (ref 39.0–52.0)
Hemoglobin: 12.8 g/dL — ABNORMAL LOW (ref 13.0–17.0)
MCH: 31 pg (ref 26.0–34.0)
MCHC: 33.9 g/dL (ref 30.0–36.0)
MCV: 91.5 fL (ref 80.0–100.0)
Platelets: 148 K/uL — ABNORMAL LOW (ref 150–400)
RBC: 4.13 MIL/uL — ABNORMAL LOW (ref 4.22–5.81)
RDW: 13.8 % (ref 11.5–15.5)
WBC: 4.8 K/uL (ref 4.0–10.5)
nRBC: 0 % (ref 0.0–0.2)

## 2023-11-04 MED ORDER — NITROGLYCERIN 1 MG/10 ML FOR IR/CATH LAB
INTRA_ARTERIAL | Status: AC
  Filled 2023-11-04: qty 10

## 2023-11-04 MED ORDER — VERAPAMIL HCL 2.5 MG/ML IV SOLN
INTRAVENOUS | Status: AC
  Filled 2023-11-04: qty 2

## 2023-11-04 MED ORDER — IOHEXOL 300 MG/ML  SOLN
100.0000 mL | Freq: Once | INTRAMUSCULAR | Status: AC | PRN
  Administered 2023-11-04: 65 mL via INTRA_ARTERIAL

## 2023-11-04 MED ORDER — HEPARIN SODIUM (PORCINE) 1000 UNIT/ML IJ SOLN
INTRAMUSCULAR | Status: AC | PRN
  Administered 2023-11-04: 3000 [IU] via INTRAVENOUS

## 2023-11-04 MED ORDER — HEPARIN SODIUM (PORCINE) 1000 UNIT/ML IJ SOLN
INTRAMUSCULAR | Status: AC
  Filled 2023-11-04: qty 10

## 2023-11-04 MED ORDER — FENTANYL CITRATE (PF) 100 MCG/2ML IJ SOLN
INTRAMUSCULAR | Status: AC | PRN
  Administered 2023-11-04: 25 ug via INTRAVENOUS

## 2023-11-04 MED ORDER — LIDOCAINE HCL 1 % IJ SOLN
10.0000 mL | Freq: Once | INTRAMUSCULAR | Status: AC
  Administered 2023-11-04: 5 mL via INTRADERMAL

## 2023-11-04 MED ORDER — VERAPAMIL HCL 2.5 MG/ML IV SOLN: INTRA_ARTERIAL | Status: AC | PRN

## 2023-11-04 MED ORDER — FENTANYL CITRATE (PF) 100 MCG/2ML IJ SOLN
INTRAMUSCULAR | Status: AC
  Filled 2023-11-04: qty 2

## 2023-11-04 NOTE — Brief Op Note (Signed)
  NEUROSURGERY BRIEF OP NOTE   PREOP IK:ilmjo fistula  POSTOP DX: Same  PROCEDURE: cerebral a gram  SURGEON: Nancyann LULLA Burns   ANESTHESIA: IV Sedation with Local  EBL: Minimal  COMPLICATIONS: None  CONDITION: Stable to recovery  FINDINGS (Full report in [facility]): DAVF   Nancyann LULLA Burns  @today @ 11:00 AM

## 2023-11-04 NOTE — Discharge Instructions (Signed)

## 2023-11-07 ENCOUNTER — Telehealth: Payer: Self-pay

## 2023-11-07 NOTE — Telephone Encounter (Signed)
 Call Answered - Called pt to advise him to call his PCP to get his Plavix  refilled

## 2023-11-07 NOTE — Telephone Encounter (Signed)
 Called pt to schedule appt.SABRASABRAPt will call later this afternoon to check avaailabliity

## 2023-11-11 NOTE — Progress Notes (Signed)
 Remote Loop Recorder Transmission

## 2023-11-14 ENCOUNTER — Ambulatory Visit

## 2023-11-14 ENCOUNTER — Ambulatory Visit: Admitting: Neuroradiology

## 2023-11-14 ENCOUNTER — Encounter: Payer: Self-pay | Admitting: Neuroradiology

## 2023-11-14 VITALS — BP 122/72 | HR 70 | Ht 72.0 in | Wt 143.6 lb

## 2023-11-14 DIAGNOSIS — I671 Cerebral aneurysm, nonruptured: Secondary | ICD-10-CM | POA: Diagnosis not present

## 2023-11-14 DIAGNOSIS — I639 Cerebral infarction, unspecified: Secondary | ICD-10-CM

## 2023-11-14 DIAGNOSIS — I43 Cardiomyopathy in diseases classified elsewhere: Secondary | ICD-10-CM

## 2023-11-14 LAB — CUP PACEART REMOTE DEVICE CHECK
Date Time Interrogation Session: 20250930234725
Implantable Pulse Generator Implant Date: 20250528

## 2023-11-14 NOTE — Progress Notes (Signed)
 Chief Complaint: Patient was seen in consultation today for  Chief Complaint  Patient presents with   Advice Only    Consult to discuss procedure/ Imaging in chart LM Discuss the reason for this surgery or other treatment options    at the request of [name],Tesfaye Shaw  Referring Physician(s): [name],Tesfaye Shaw  History of Present Illness: Robert Shaw is a 73 y.o. 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  The lesion was found incidentally during evaluation of a stroke.  He had a small right parietal cortical infarct.  There was no carotid stenosis.  Echo showed normal EF with no structural lesion and his loop recorder was negative.  He had an arteriogram 11/04/2023 which provided a good map of the lesion.  Past Medical History:  Diagnosis Date   Bradycardia 03/18/2012   Coronary artery disease    Dyslipidemia    Family history of heart disease    History of nuclear stress test 07/13/2008   dipyridamole; normal perfusion, no ischemia, low risk    Hypertension    NSTEMI (non-ST elevated myocardial infarction) (HCC)    h/o   Tobacco abuse     Past Surgical History:  Procedure Laterality Date   CARDIAC CATHETERIZATION  04/2001   after high lateral MI, preserved LV function w/mild mid anterolateral hypokinesia, diffuse coronary irregularity with signficiant obstruction in LAD, OM1, superior branch of ramus (Dr. Lupita Pike - [facility])   CARDIAC CATHETERIZATION  09/23/2001   patent stents from 06/2001 (Dr. DOROTHA Schwalbe)   CARDIAC CATHETERIZATION  09/17/2002   no restenosis at prior intervention sites (Dr. IVAR Sor)   CARDIAC CATHETERIZATION  09/09/2009   no significant CAD, patent stents in OM1 & LAD (Dr. IVAR Sor)    COLONOSCOPY N/A 02/25/2017   Procedure: COLONOSCOPY;  Surgeon: Harvey Margo CROME, MD;  Location: [facility];  Service: Endoscopy;  Laterality: N/A;  10:30 Am    COLONOSCOPY WITH PROPOFOL  N/A 04/15/2020   Procedure: COLONOSCOPY WITH PROPOFOL ;  Surgeon: Cindie Carlin POUR, DO;  Location: [facility];  Service: Endoscopy;  Laterality: N/A;  pm ASA II/   CORONARY ANGIOPLASTY WITH STENT PLACEMENT  06/19/2001   Cfx (3.5x5mm Zeta stent) & diagonal stenting (2.5x41mm pixel stent) w/moderate LAD disease (Dr. IVAR Sor)    CORONARY ANGIOPLASTY WITH STENT PLACEMENT  03/17/2012   Xience DES (2.25x54mm) of mid-distal LAD (Dr. IVAR Sor)   CORONARY STENT INTERVENTION N/A 06/26/2022   Procedure: CORONARY STENT INTERVENTION;  Surgeon: Swaziland, Peter M, MD;  Location: [facility] INVASIVE CV LAB;  Service: Cardiovascular;  Laterality: N/A;   IR ANGIO INTRA EXTRACRAN SEL COM CAROTID INNOMINATE BILAT MOD SED  11/04/2023   IR ANGIO VERTEBRAL SEL VERTEBRAL UNI R MOD SED  11/04/2023   IR US  GUIDE VASC ACCESS RIGHT  11/04/2023   LEFT HEART CATH AND CORONARY ANGIOGRAPHY N/A 06/26/2022   Procedure: LEFT HEART CATH AND CORONARY ANGIOGRAPHY;  Surgeon: Swaziland, Peter M, MD;  Location: [facility] INVASIVE CV LAB;  Service: Cardiovascular;  Laterality: N/A;   LEFT HEART CATHETERIZATION WITH CORONARY ANGIOGRAM N/A 03/17/2012   Procedure: LEFT HEART CATHETERIZATION WITH CORONARY ANGIOGRAM;  Surgeon: Vinie KYM Maxcy, MD;  Location: [facility] CATH LAB;  Service: Cardiovascular;  Laterality: N/A;   LOOP RECORDER INSERTION N/A 07/11/2023   Procedure: LOOP RECORDER INSERTION;  Surgeon: Nancey Eulas FORBES, MD;  Location: [facility] INVASIVE CV LAB;  Service: Cardiovascular;  Laterality: N/A;   PERCUTANEOUS CORONARY  STENT INTERVENTION (PCI-S)  03/17/2012   Procedure: PERCUTANEOUS CORONARY STENT INTERVENTION (PCI-S);  Surgeon: Vinie KYM Maxcy, MD;  Location: [facility] CATH LAB;  Service: Cardiovascular;;   POLYPECTOMY  02/25/2017   Procedure: POLYPECTOMY;  Surgeon: Harvey Margo CROME, MD;  [facility]: [facility];  Service: Endoscopy;;  cecal x2; ascending x4;hepatic flexure;splenic flexure; descending x2;   POLYPECTOMY  04/15/2020   Procedure:  POLYPECTOMY;  Surgeon: Cindie Carlin POUR, DO;  Location: [facility];  Service: Endoscopy;;  cecal    TRANSTHORACIC ECHOCARDIOGRAM  03/29/2006   EF normal; mild MR & TR; AV mildly sclerotic    Allergies: Patient has no known allergies.  Medications: Prior to Admission medications   Medication Sig Start Date End Date Taking? Authorizing Provider  acetaminophen  (TYLENOL ) 500 MG tablet Take 500 mg by mouth every 6 (six) hours as needed for moderate pain.   Yes [provider]  albuterol  (VENTOLIN  HFA) 108 (90 Base) MCG/ACT inhaler Inhale 2 puffs into the lungs every 4 (four) hours as needed for wheezing or shortness of breath. 09/16/19  Yes [provider]  amLODipine  (NORVASC ) 5 MG tablet Take 5 mg by mouth daily. 09/27/21  Yes [provider]  clopidogrel  (PLAVIX ) 75 MG tablet Take 1 tablet (75 mg total) by mouth daily. 07/11/23  Yes de Clint Kill, Cortney E, NP  doxycycline  (VIBRAMYCIN ) 100 MG capsule Take 1 capsule (100 mg total) by mouth 2 (two) times daily. 06/18/23  Yes McKenzie, Belvie CROME, MD  loratadine  (CLARITIN ) 10 MG tablet Take 10 mg by mouth daily.   Yes [provider]  nitroGLYCERIN  (NITROSTAT ) 0.4 MG SL tablet Place 1 tablet (0.4 mg total) under the tongue every 5 (five) minutes as needed for chest pain (up to 3 doses). 06/27/22  Yes Dunn, Dayna N, PA-C  rosuvastatin  (CRESTOR ) 10 MG tablet Take 10 mg by mouth daily. 06/07/23  Yes [provider]  tadalafil (CIALIS) 20 MG tablet Take 20 mg by mouth daily. 09/03/23  Yes [provider]  tamsulosin  (FLOMAX ) 0.4 MG CAPS capsule Take 1 capsule (0.4 mg total) by mouth in the morning and at bedtime. 10/16/23  Yes McKenzie, Belvie CROME, MD     Family History  Problem Relation Age of Onset   Heart disease Brother        x3   Stroke Father     Social History   Socioeconomic History   Marital status: Married    Spouse name: Not on file   Number of children: 1   Years of education: Not on  file   Highest education level: Not on file  Occupational History   Not on file  Tobacco Use   Smoking status: Every Day    Current packs/day: 0.10    Average packs/day: 0.1 packs/day for 40.0 years (4.0 ttl pk-yrs)    Types: Cigarettes   Smokeless tobacco: Never   Tobacco comments:    smokes 3-4 cigarettes daily (it all depends) 10/27/13  Vaping Use   Vaping status: Never Used  Substance and Sexual Activity   Alcohol use: No   Drug use: No   Sexual activity: Not on file  Other Topics Concern   Not on file  Social History Narrative   Not on file   Social Drivers of Health   Financial Resource Strain: Not on file  Food Insecurity: No Food Insecurity (07/12/2023)   Hunger Vital Sign    Worried About Running Out of Food in the Last Year: Never true  Ran Out of Food in the Last Year: Never true  Transportation Needs: No Transportation Needs (07/12/2023)   PRAPARE - Administrator, Civil Service (Medical): No    Lack of Transportation (Non-Medical): No  Physical Activity: Not on file  Stress: Not on file  Social Connections: Moderately Integrated (07/10/2023)   Social Connection and Isolation Panel    Frequency of Communication with Friends and Family: More than three times a week    Frequency of Social Gatherings with Friends and Family: More than three times a week    Attends Religious Services: More than 4 times per year    Active Member of [employer] or Organizations: No    Attends Banker Meetings: Never    Marital Status: Married    Review of Systems  Respiratory:  Negative for chest tightness and shortness of breath.   Cardiovascular:  Negative for chest pain.  Gastrointestinal:  Negative for abdominal pain.    Vital Signs: BP 122/72   Pulse 70   Ht 6' (1.829 m)   Wt 143 lb 9.6 oz (65.1 kg)   SpO2 99%   BMI 19.48 kg/m   Physical Exam Constitutional:      Appearance: Normal appearance. He is normal weight.  Cardiovascular:     Rate  and Rhythm: Normal rate and regular rhythm.     Pulses: Normal pulses.     Heart sounds: Normal heart sounds.  Pulmonary:     Effort: Pulmonary effort is normal.     Breath sounds: Normal breath sounds.  Neurological:     Mental Status: He is alert.   Alert and oriented with normal speech expression, fluency and comprehension. Visual fields are full to confrontation.   Face is symmetric. Strength in the arms and legs is symmetric with no drift.  Sensation is normal and symmetric. No ataxia. No inattention.   Imaging: CUP PACEART REMOTE DEVICE CHECK Result Date: 11/14/2023 ILR summary report received. Battery status OK. Normal device function. No new symptom, tachy, brady, or pause episodes. No new AF episodes. Monthly summary reports and ROV/PRN LA, CVR  IR US  Guide Vasc Access Right Result Date: 11/04/2023 INDICATION: Dural arteriovenous fistula EXAM: Cerebral arteriogram COMPARISON:  Brain MRI 07/11/2023 MEDICATIONS: No additional ANESTHESIA/SEDATION: Local anesthetic and analgesia CONTRAST:  65mL OMNIPAQUE  IOHEXOL  300 MG/ML  SOLN FLUOROSCOPY: Radiation Exposure Index (as provided by the fluoroscopic device): 232 mGy Kerma COMPLICATIONS: None immediate. TECHNIQUE: Informed written consent was obtained from the patient after a thorough discussion of the procedural risks, benefits and alternatives. All questions were addressed. Maximal Sterile Barrier Technique was utilized including caps, mask, sterile gowns, sterile gloves, sterile drape, hand hygiene and skin antiseptic. A timeout was performed prior to the initiation of the procedure. PROCEDURE: The right radial artery was punctured under ultrasound guidance. The needle was visualized on ultrasound entering the artery. A 5 French slender sheath was placed. A Simmons 2 catheter was used for selective catheterization of the left common carotid artery, internal carotid artery, external carotid artery, right common carotid artery and right  vertebral artery. FINDINGS: Left common carotid bifurcation: Normal Left internal carotid: The anterior and middle cerebral arteries are normal. No significant contribution to the fistula. The arterial, capillary and venous phases are normal. Left external carotid: There is a high flow dural arteriovenous fistula on the left frontal dura. Multiple enlarged meningeal arteries feed the fistula and there are extensive, dilated cerebral veins draining the fistula. These eventually drained to the superior sagittal sinus  predominantly. Right common carotid: The bifurcation is normal. The anterior and middle cerebral arteries are normal intracranially. The arterial, capillary and venous phases are normal. Right vertebral: The vertebral arteries, basilar artery and posterior circulation arteries are normal. The arterial, capillary and venous phases are normal. IMPRESSION: High-flow left frontal dural arteriovenous fistula with markedly ectatic pial venous drainage Electronically Signed   By: Nancyann Burns M.D.   On: 11/04/2023 16:11   IR ANGIO VERTEBRAL SEL VERTEBRAL UNI R MOD SED Result Date: 11/04/2023 INDICATION: Dural arteriovenous fistula EXAM: Cerebral arteriogram COMPARISON:  Brain MRI 07/11/2023 MEDICATIONS: No additional ANESTHESIA/SEDATION: Local anesthetic and analgesia CONTRAST:  65mL OMNIPAQUE  IOHEXOL  300 MG/ML  SOLN FLUOROSCOPY: Radiation Exposure Index (as provided by the fluoroscopic device): 232 mGy Kerma COMPLICATIONS: None immediate. TECHNIQUE: Informed written consent was obtained from the patient after a thorough discussion of the procedural risks, benefits and alternatives. All questions were addressed. Maximal Sterile Barrier Technique was utilized including caps, mask, sterile gowns, sterile gloves, sterile drape, hand hygiene and skin antiseptic. A timeout was performed prior to the initiation of the procedure. PROCEDURE: The right radial artery was punctured under ultrasound guidance. The needle  was visualized on ultrasound entering the artery. A 5 French slender sheath was placed. A Simmons 2 catheter was used for selective catheterization of the left common carotid artery, internal carotid artery, external carotid artery, right common carotid artery and right vertebral artery. FINDINGS: Left common carotid bifurcation: Normal Left internal carotid: The anterior and middle cerebral arteries are normal. No significant contribution to the fistula. The arterial, capillary and venous phases are normal. Left external carotid: There is a high flow dural arteriovenous fistula on the left frontal dura. Multiple enlarged meningeal arteries feed the fistula and there are extensive, dilated cerebral veins draining the fistula. These eventually drained to the superior sagittal sinus predominantly. Right common carotid: The bifurcation is normal. The anterior and middle cerebral arteries are normal intracranially. The arterial, capillary and venous phases are normal. Right vertebral: The vertebral arteries, basilar artery and posterior circulation arteries are normal. The arterial, capillary and venous phases are normal. IMPRESSION: High-flow left frontal dural arteriovenous fistula with markedly ectatic pial venous drainage Electronically Signed   By: Nancyann Burns M.D.   On: 11/04/2023 16:11   IR ANGIO INTRA EXTRACRAN SEL COM CAROTID INNOMINATE BILAT MOD SED Result Date: 11/04/2023 INDICATION: Dural arteriovenous fistula EXAM: Cerebral arteriogram COMPARISON:  Brain MRI 07/11/2023 MEDICATIONS: No additional ANESTHESIA/SEDATION: Local anesthetic and analgesia CONTRAST:  65mL OMNIPAQUE  IOHEXOL  300 MG/ML  SOLN FLUOROSCOPY: Radiation Exposure Index (as provided by the fluoroscopic device): 232 mGy Kerma COMPLICATIONS: None immediate. TECHNIQUE: Informed written consent was obtained from the patient after a thorough discussion of the procedural risks, benefits and alternatives. All questions were addressed. Maximal  Sterile Barrier Technique was utilized including caps, mask, sterile gowns, sterile gloves, sterile drape, hand hygiene and skin antiseptic. A timeout was performed prior to the initiation of the procedure. PROCEDURE: The right radial artery was punctured under ultrasound guidance. The needle was visualized on ultrasound entering the artery. A 5 French slender sheath was placed. A Simmons 2 catheter was used for selective catheterization of the left common carotid artery, internal carotid artery, external carotid artery, right common carotid artery and right vertebral artery. FINDINGS: Left common carotid bifurcation: Normal Left internal carotid: The anterior and middle cerebral arteries are normal. No significant contribution to the fistula. The arterial, capillary and venous phases are normal. Left external carotid: There is a high flow dural  arteriovenous fistula on the left frontal dura. Multiple enlarged meningeal arteries feed the fistula and there are extensive, dilated cerebral veins draining the fistula. These eventually drained to the superior sagittal sinus predominantly. Right common carotid: The bifurcation is normal. The anterior and middle cerebral arteries are normal intracranially. The arterial, capillary and venous phases are normal. Right vertebral: The vertebral arteries, basilar artery and posterior circulation arteries are normal. The arterial, capillary and venous phases are normal. IMPRESSION: High-flow left frontal dural arteriovenous fistula with markedly ectatic pial venous drainage Electronically Signed   By: Nancyann Burns M.D.   On: 11/04/2023 16:11    Labs:  CBC: Recent Labs    07/10/23 1201 07/10/23 1209 07/11/23 0440 11/04/23 0924  WBC 7.2  --  5.9 4.8  HGB 13.2 12.9* 12.1* 12.8*  HCT 37.8* 38.0* 34.7* 37.8*  PLT 173  --  174 148*    COAGS: Recent Labs    07/10/23 1201  INR 1.1  APTT 29    BMP: Recent Labs    01/07/23 0926 04/06/23 0852 07/10/23 1201  07/10/23 1209 07/11/23 0440  NA 143 138 136 139 138  K 4.5 4.8 3.3* 3.4* 3.7  CL 108* 105 103 105 109  CO2 21 25 21*  --  23  GLUCOSE 103* 110* 142* 142* 105*  BUN 9 13 11 9  7*  CALCIUM  8.8 9.3 9.1  --  8.4*  CREATININE 1.25 1.37* 1.44* 1.50* 1.40*  GFRNONAA  --  55* 52*  --  54*    LIVER FUNCTION TESTS: Recent Labs    07/10/23 1201  BILITOT 1.3*  AST 23  ALT 13  ALKPHOS 86  PROT 7.3  ALBUMIN 4.2    TUMOR MARKERS: No results for input(s): AFPTM, CEA, CA199, CHROMGRNA in the last 8760 hours.  Assessment:  High flow dural arteriovenous fistula with pedal venous drainage and venous ectasia.  Plan:  I have discussed the diagnosis with Lytle and his wife.  I have explained that while it is hard to predict the risk of hemorrhage, I think it could be as high as 20% over the next 5 years.  I have estimated the risk of a surgical repair around 3% for any significant complication which could include stroke or hemorrhage.  He understands the above and would like to proceed with surgical treatment.  We will arrange this within the next few weeks.   Electronically Signed: Nancyann LULLA Burns  11/14/2023, 1:53 PM

## 2023-11-14 NOTE — Progress Notes (Signed)
 Remote Loop Recorder Transmission

## 2023-11-15 ENCOUNTER — Other Ambulatory Visit: Payer: Self-pay

## 2023-11-15 NOTE — Addendum Note (Signed)
 Addended by: Kamilo Och on: 11/15/2023 03:32 PM   Modules accepted: Orders

## 2023-11-25 ENCOUNTER — Ambulatory Visit: Payer: Self-pay | Admitting: Cardiovascular Disease

## 2023-11-25 DIAGNOSIS — I10 Essential (primary) hypertension: Secondary | ICD-10-CM | POA: Diagnosis not present

## 2023-11-25 DIAGNOSIS — Z1389 Encounter for screening for other disorder: Secondary | ICD-10-CM | POA: Diagnosis not present

## 2023-11-25 DIAGNOSIS — G459 Transient cerebral ischemic attack, unspecified: Secondary | ICD-10-CM | POA: Diagnosis not present

## 2023-11-25 DIAGNOSIS — I700 Atherosclerosis of aorta: Secondary | ICD-10-CM | POA: Diagnosis not present

## 2023-11-25 DIAGNOSIS — J439 Emphysema, unspecified: Secondary | ICD-10-CM | POA: Diagnosis not present

## 2023-11-25 DIAGNOSIS — F17210 Nicotine dependence, cigarettes, uncomplicated: Secondary | ICD-10-CM | POA: Diagnosis not present

## 2023-11-25 DIAGNOSIS — M4726 Other spondylosis with radiculopathy, lumbar region: Secondary | ICD-10-CM | POA: Diagnosis not present

## 2023-11-25 DIAGNOSIS — I770 Arteriovenous fistula, acquired: Secondary | ICD-10-CM | POA: Diagnosis not present

## 2023-11-25 DIAGNOSIS — Z0001 Encounter for general adult medical examination with abnormal findings: Secondary | ICD-10-CM | POA: Diagnosis not present

## 2023-11-25 DIAGNOSIS — Z79899 Other long term (current) drug therapy: Secondary | ICD-10-CM | POA: Diagnosis not present

## 2023-11-25 DIAGNOSIS — F17200 Nicotine dependence, unspecified, uncomplicated: Secondary | ICD-10-CM | POA: Diagnosis not present

## 2023-12-16 ENCOUNTER — Encounter

## 2023-12-16 ENCOUNTER — Ambulatory Visit

## 2023-12-16 DIAGNOSIS — I43 Cardiomyopathy in diseases classified elsewhere: Secondary | ICD-10-CM | POA: Diagnosis not present

## 2023-12-16 DIAGNOSIS — I639 Cerebral infarction, unspecified: Secondary | ICD-10-CM | POA: Diagnosis not present

## 2023-12-16 LAB — CUP PACEART REMOTE DEVICE CHECK
Date Time Interrogation Session: 20251102235149
Implantable Pulse Generator Implant Date: 20250528

## 2023-12-18 NOTE — Progress Notes (Signed)
 Remote Loop Recorder Transmission

## 2023-12-19 ENCOUNTER — Ambulatory Visit: Payer: Self-pay | Admitting: Cardiovascular Disease

## 2023-12-23 ENCOUNTER — Other Ambulatory Visit: Payer: Self-pay

## 2023-12-26 ENCOUNTER — Encounter (HOSPITAL_COMMUNITY): Payer: Self-pay | Admitting: Neuroradiology

## 2023-12-26 ENCOUNTER — Other Ambulatory Visit: Payer: Self-pay

## 2023-12-26 NOTE — Progress Notes (Signed)
 PCP - Dr Benita Outhouse Cardiologist - Dr. Vinie Kehr  PPM/ICD - Denies Patient has loop recorder  Chest x-ray - 06/25/22 EKG - 07/10/23 Stress Test - 07/13/2008 ECHO - 07/11/2023 Cardiac Cath - 06/26/2022  CPAP - Denies  DM - Denies  Blood Thinner Instructions: States no longer takes plavix  Aspirin  Instructions: Ask Surgeon  NPO  COVID TEST- n/a  Anesthesia review: Yes, cardiac history and loop recorder  Patient verbally denies any shortness of breath, fever, cough and chest pain during phone call   -------------  SDW INSTRUCTIONS given:  Your procedure is scheduled on 12/30/23.  Report to [facility] Main Entrance A at 0530 A.M., and check in at the Admitting office.  Any questions or running late day of surgery: call [phone]    Remember:  Do not eat after midnight the night before your surgery    Take these medicines the morning of surgery with A SIP OF WATER   Amlodipine  Tamsulosin    May take these medicines IF NEEDED:  Tylenol  Albuterol  Nitroglycerin   As of today, STOP taking any Aspirin  (unless otherwise instructed by your surgeon) Aleve , Naproxen , Ibuprofen , Motrin , Advil , Goody's, BC's, all herbal medications, fish oil, and all vitamins.   Do NOT Smoke (Tobacco/Vaping) 24 hours prior to your procedure  If you use a CPAP at night, you may bring all equipment for your overnight stay.     You will be asked to remove any contacts, glasses, piercing's, hearing aid's, dentures/partials prior to surgery. Please bring cases for these items if needed.     Patients discharged the day of surgery will not be allowed to drive home, and someone needs to stay with them for 24 hours.  SURGICAL WAITING ROOM VISITATION Patients may have no more than 2 support people in the waiting area - these visitors may rotate.   Pre-op nurse will coordinate an appropriate time for 1 ADULT support person, who may not rotate, to accompany patient in pre-op.  Children under  the age of 65 must have an adult with them who is not the patient and must remain in the main waiting area with an adult.  If the patient needs to stay at the hospital during part of their recovery, the visitor guidelines for inpatient rooms apply.  Please refer to the [facility] website for the visitor guidelines for any additional information.   Special instructions:   [facility]- Preparing For Surgery   Please follow these instructions carefully.   Shower the NIGHT BEFORE SURGERY and the MORNING OF SURGERY with DIAL Soap.   Pat yourself dry with a CLEAN TOWEL.  Wear CLEAN PAJAMAS to bed the night before surgery  Place CLEAN SHEETS on your bed the night of your first shower and DO NOT SLEEP WITH PETS.   Additional instructions for the day of surgery: DO NOT APPLY any lotions, deodorants, cologne, or perfumes.   Do not wear jewelry or makeup Do not wear nail polish, gel polish, artificial nails, or any other type of covering on natural nails (fingers and toes) Do not bring valuables to the hospital. [facility] is not responsible for valuables/personal belongings. Put on clean/comfortable clothes.  Please brush your teeth.  Ask your nurse before applying any prescription medications to the skin.    Questions were answered. Patient verbalized understanding of instructions.

## 2023-12-26 NOTE — Addendum Note (Signed)
 Addended by: Deshannon Hinchliffe on: 12/26/2023 10:59 AM   Modules accepted: Orders

## 2023-12-27 ENCOUNTER — Ambulatory Visit: Attending: Internal Medicine | Admitting: Internal Medicine

## 2023-12-27 ENCOUNTER — Telehealth: Payer: Self-pay

## 2023-12-27 VITALS — BP 134/78 | HR 58 | Ht 72.0 in | Wt 141.0 lb

## 2023-12-27 DIAGNOSIS — I10 Essential (primary) hypertension: Secondary | ICD-10-CM

## 2023-12-27 DIAGNOSIS — I2510 Atherosclerotic heart disease of native coronary artery without angina pectoris: Secondary | ICD-10-CM

## 2023-12-27 DIAGNOSIS — F17200 Nicotine dependence, unspecified, uncomplicated: Secondary | ICD-10-CM

## 2023-12-27 DIAGNOSIS — C61 Malignant neoplasm of prostate: Secondary | ICD-10-CM

## 2023-12-27 NOTE — Anesthesia Preprocedure Evaluation (Addendum)
 Anesthesia Evaluation  Patient identified by MRN, date of birth, ID band Patient awake    Reviewed: Allergy & Precautions, NPO status , Patient's Chart, lab work & pertinent test results  History of Anesthesia Complications Negative for: history of anesthetic complications  Airway Mallampati: II  TM Distance: >3 FB Neck ROM: Full    Dental no notable dental hx. (+) Teeth Intact, Dental Advisory Given   Pulmonary neg pulmonary ROS, asthma , Current Smoker and Patient abstained from smoking.   Pulmonary exam normal breath sounds clear to auscultation       Cardiovascular hypertension, Pt. on medications (-) angina + CAD, + Past MI (2003, 2014,2024) and + Cardiac Stents  Normal cardiovascular exam Rhythm:Regular Rate:Normal  Echo 07/11/2023: IMPRESSIONS   1. Left ventricular ejection fraction, by estimation, is 60 to 65%. The  left ventricle has normal function. The left ventricle has no regional  wall motion abnormalities. Left ventricular diastolic parameters were  normal.   2. Right ventricular systolic function is normal. The right ventricular  size is normal. Tricuspid regurgitation signal is inadequate for assessing  PA pressure.   3. The mitral valve is normal in structure. Trivial mitral valve  regurgitation. No evidence of mitral stenosis.   4. The aortic valve was not well visualized. Aortic valve regurgitation  is trivial. Aortic valve sclerosis/calcification is present, without any  evidence of aortic stenosis.   5. The inferior vena cava is normal in size with greater than 50%  respiratory variability, suggesting right atrial pressure of 3 mmHg.     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, No Residual Symptoms    GI/Hepatic ,neg GERD  ,,  Endo/Other    Renal/GU Renal  InsufficiencyRenal diseaseLab Results      Component                Value               Date                      NA                       138                 12/30/2023                CL                       107                 12/30/2023                K                        4.2                 12/30/2023                CO2                      24                  12/30/2023  BUN                      15                  12/30/2023                CREATININE               1.35 (H)            12/30/2023                GFRNONAA                 56 (L)              12/30/2023                CALCIUM                   8.5 (L)             12/30/2023                ALBUMIN                  4.2                 07/10/2023                GLUCOSE                  101 (H)             12/30/2023              Prostate CA    Musculoskeletal   Abdominal   Peds  Hematology Lab Results      Component                Value               Date                      WBC                      4.3                 12/30/2023                HGB                      11.8 (L)            12/30/2023                HCT                      34.2 (L)            12/30/2023                MCV                      90.7                12/30/2023                PLT  130 (L)             12/30/2023              Anesthesia Other Findings   Reproductive/Obstetrics                              Anesthesia Physical Anesthesia Plan  ASA: 3  Anesthesia Plan: General   Post-op Pain Management: Ofirmev  IV (intra-op)*   Induction: Intravenous  PONV Risk Score and Plan: 3 and Treatment may vary due to age or medical condition, Ondansetron  and Midazolam   Airway Management Planned: Oral ETT  Additional Equipment: Arterial line  Intra-op Plan:   Post-operative Plan: Extubation in OR  Informed Consent: I have reviewed the patients History and Physical, chart,  labs and discussed the procedure including the risks, benefits and alternatives for the proposed anesthesia with the patient or authorized representative who has indicated his/her understanding and acceptance.     Dental advisory given  Plan Discussed with:   Anesthesia Plan Comments: (PAT note written 12/27/2023 by Isaiah Ruder, PA-C.  2 x 18 g  )         Anesthesia Quick Evaluation

## 2023-12-27 NOTE — Telephone Encounter (Signed)
 Patient's wife called to clarify medication instructions for Monday. Patient can stay on aspirin  81mg .

## 2023-12-27 NOTE — Progress Notes (Signed)
 OFFICE NOTE  Chief Complaint:  Follow-up decreased appetite, weight loss  Primary Care Physician: [name], [name], MD  HPI:  Robert Shaw is a 73 year old gentleman whom I have been following for a history of coronary disease. He had a stent in his circumflex and diagonal in 2003 and had moderate disease in the LAD and the right coronary. He continues to smoke, which I have talked to him about as a marked risk factor, and he has been working on some exercise. Unfortunately, on January 31 he had an episode when he developed chest pain, somewhat after eating with some shortness of breath, that went to both arms. He took some aspirin  at home and the pain did not go away. He, therefore, presented to the emergency department. He was found to have an elevated troponin of 0.72 and underwent cardiac catheterization. That catheterization, performed by me, showed a severe discrete 95% stenosis in the mid LAD. Both the stents in the diagonal and circumflex were patent with minimal in-stent restenosis. The EF was greater than 60% with no focal wall motion abnormalities. The patient then underwent a Xience drug-eluting stent placement to the mid LAD on March 17, 2012, by Dr. Burnard. This was successful at reducing the lesion to 0% stenosis.  Since his hospitalization he has done very well. He has had no further chest pain. He was switched successfully from Plavix  over to Brilinta , which he is taking with low-dose aspirin . His only complaint now is that he is having some numbness and 2 toes on his right foot. He reports he had problems with his right leg after the catheterization saying that he developed a foot drop but that has progressively gotten better over the past year.  He had no hematoma, bruit or catheterization complications that we were aware of at the time. Unfortunately, he continues to smoke, which is an ongoing risk factor.  Robert Shaw returns today for followup. He is doing well denies  any chest pain or worsening shortness of breath. He continues on aspirin  and Brillinta. During his last office visit we had recommended starting Chantix  for smoking cessation however he has not started that medication. He continues to smoke several cigarettes a day. He has no good reason for why he did not start the medication. He is also anticipating undergoing a switched to Medicare this year. Recent laboratory work was performed by his primary care provider which showed an impaired fasting glucose at 101 and hemoglobin A1c of 6.1 suggesting he may be prediabetic. His creatinine is 1.4. Lipid profile demonstrates total cholesterol 107, HDL 37, triglycerides 95 and LDL 51.  I saw Robert Shaw back in the office today. He is currently without any complaints. He denies any chest pain or worsening shortness of breath. Unfortunately has not been able to stop smoking but he is down to about 2 cigarettes a day. As mentioned above his last PCI was in February 2014. He remains on dual antiplatelets therapy with Brilinta , this was for non-STEMI. The data with long-term therapy of Brilinta  does not go out much past 2 years therefore we may consider having him come off of this medication.  09/14/2015  Robert Shaw returns today for follow-up. He is without complaints. Overall he is doing really well. Denies any shortness of breath or chest pain. He remains physically active. He had recent cholesterol testing through his primary care provider which showed LDL less than 70. He is on Crestor  10 mg daily. Blood pressure is at  goal.  10/08/2016  Robert Shaw was seen today in follow-up. This is an annual visit. Overall he is asymptomatic. He denies any chest pain or worsening shortness of breath. 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 He is a blood pressure is well-controlled today. He has not had a recent lipid check. His EKG was personally reviewed and shows stable anterolateral T-wave inversions in V3 through V6 as  well as high lateral leads of 1 and aVL. He's not had any coronary events that were aware of since 2014.  10/31/2017  Robert Shaw returns for annual visit.  Overall continues to do well.  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  Denies any chest pain or worsening shortness of breath.  He is maintained on low-dose aspirin , metoprolol  and Crestor .  He is due for repeat lipid profile as his last study was a year ago.  He has not had any recent testing.  EKG personally reviewed demonstrates stable lateral T wave inversions.  10/29/2019  Robert Shaw is seen today in follow-up with his wife.  He continues to do well and is asymptomatic.  He denies chest pain or shortness of breath.  He is noted to have some bradycardia today.  He was previously on metoprolol  12-1/2 twice daily but is now been taking a full tablet at night.  Heart rates in the 40s today but he is asymptomatic.  Lipids have been well controlled with a total cholesterol 104, HDL 36, LDL 52 and triglycerides 77.  Blood pressure is at goal today as well at 120/80.  12/28/2020  Robert Shaw returns today for follow-up.  Blood pressure is excellent 128/80.  He denies any chest pain or shortness of breath.  EKG shows a normal sinus rhythm.  He still has anterolateral T wave inversions which were present previously.  He has well-controlled lipids on a statin with LDL of 59.  A1c 5.7.  Of interest his creatinine was elevated at 1.6 in July.  He says he had no knowledge of this.  He is advised to follow-up with his PCP regarding this.  12/25/2021  Robert Shaw is seen today in follow-up.  Overall he says he is feeling pretty well.  He Nuys any chest pain or shortness of breath.  Unfortunate continues to smoke.  Has not been able to cut back on this much.  He is blood pressure is reasonably well controlled today.  EKG shows a sinus bradycardia and unchanged from prior EKGs.  His lipids were in August showing total  cholesterol 122, HDL 44, triglycerides 74 and LDL 63 which is at goal.  89/68/7975  Robert Shaw is seen today in follow-up. His main concern is that he has been losing weight unintentionally. He was recently placed on Megace by his PCP for this. He has also had some exertional dyspnea. Recently he had an NSTEMI in 06/2022. Cardiac cath showed distal LAD disease and he was stented. After that he noted some symptomatic improvement, but subsequently he has continued to decline. He is frustrated that we have not identified the cause of his significant weight loss, although by the scales he is only about 1 lb less than back in May. He says his appetite is poor. He denies nausea, vomiting, diarrhea, fever or consitutional symptoms. He has a colonoscopy a few years ago which showed some polyps. He had a recent bladder infection which was treated. He is a former smoker but has had yearly  lung cancer screening CT's which have been negative.  03/29/2023  Robert Shaw returns today for follow-up.  Recently has actually gained a little bit of weight back.  He had a CT scan that I ordered of the chest abdomen and pelvis looking for any signs of significant malignancy.  There was some heterogeneous enhancement of the enlarged prostate.  This was correlated with PSA which was noted to be elevated.  He has seen Dr. Sherrilee who did a prostate biopsy and it did confirm what sounds at the low-grade prostate cancer.  He was reassured and has follow-up for this.  I do not think this is playing a role in his weight loss however that question was not opposed to Dr. Sherrilee.  Robert Shaw otherwise is doing well.  Denies any chest pain or shortness of breath.  He is now interested in smoking cessation.  We discussed this at some length today.  We also discussed options for medical therapy to help with this.  This could include patches or gum, Chantix  or Wellbutrin .  Unconcerned about the side effects potentially with Chantix  given his  age.  Wellbutrin  might be an option for him due to some of its antidepressant properties however he will have to be mindful of possible weight loss.  12/27/2023  Robert Shaw is seen today in follow-up.  His weight has stayed more stable but is still low at 141 pounds with a BMI of 19.  He stopped Plavix  in March but unfortunately in May he had a stroke.  This was in the right posterior MCA therapy and he received tenecteplase .  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  He is scheduled to go into a procedure next week with neuroradiology to see if that can be treated.  He is back off of the Plavix .  He had a loop recorder placed.  This does not show any evidence of atrial fibrillation so far.  Blood pressure was okay today.  He denies any chest pain or shortness of breath.  Unfortunately has not stop smoking but he has cut back significantly.  PMHx:  Past Medical History:  Diagnosis Date   Asthma    Bradycardia 03/18/2012   Coronary artery disease    Dyslipidemia    Family history of heart disease    History of nuclear stress test 07/13/2008   dipyridamole; normal perfusion, no ischemia, low risk    Hypertension    Neuromuscular disorder (HCC)    Sciatica   NSTEMI (non-ST elevated myocardial infarction) (HCC)    h/o   Stroke (HCC)    Tobacco abuse     Past Surgical History:  Procedure Laterality Date   CARDIAC CATHETERIZATION  04/2001   after high lateral MI, preserved LV function w/mild mid anterolateral hypokinesia, diffuse coronary irregularity with signficiant obstruction in LAD, OM1, superior branch of ramus (Dr. Lupita Pike - [facility])   CARDIAC CATHETERIZATION  09/23/2001   patent stents from 06/2001 (Dr. DOROTHA Schwalbe)   CARDIAC CATHETERIZATION  09/17/2002   no restenosis at prior intervention sites (Dr. IVAR Sor)   CARDIAC CATHETERIZATION  09/09/2009   no significant CAD, patent stents in OM1 & LAD (Dr. IVAR Sor)     COLONOSCOPY N/A 02/25/2017   Procedure: COLONOSCOPY;  Surgeon: Harvey Margo CROME, MD;  [facility]: [facility];  Service: Endoscopy;  Laterality: N/A;  10:30 Am   COLONOSCOPY WITH PROPOFOL  N/A 04/15/2020   Procedure: COLONOSCOPY WITH PROPOFOL ;  Surgeon: Cindie Carlin POUR, DO;  Location: [facility];  Service: Endoscopy;  Laterality: N/A;  pm ASA II/   CORONARY ANGIOPLASTY WITH STENT PLACEMENT  06/19/2001   Cfx (3.5x16mm Zeta stent) & diagonal stenting (2.5x2mm pixel stent) w/moderate LAD disease (Dr. IVAR Sor)    CORONARY ANGIOPLASTY WITH STENT PLACEMENT  03/17/2012   Xience DES (2.25x50mm) of mid-distal LAD (Dr. IVAR Sor)   CORONARY STENT INTERVENTION N/A 06/26/2022   Procedure: CORONARY STENT INTERVENTION;  Surgeon: Jordan, Peter M, MD;  Location: [facility] INVASIVE CV LAB;  Service: Cardiovascular;  Laterality: N/A;   IR ANGIO INTRA EXTRACRAN SEL COM CAROTID INNOMINATE BILAT MOD SED  11/04/2023   IR ANGIO VERTEBRAL SEL VERTEBRAL UNI R MOD SED  11/04/2023   IR US  GUIDE VASC ACCESS RIGHT  11/04/2023   LEFT HEART CATH AND CORONARY ANGIOGRAPHY N/A 06/26/2022   Procedure: LEFT HEART CATH AND CORONARY ANGIOGRAPHY;  Surgeon: Jordan, Peter M, MD;  Location: [facility] INVASIVE CV LAB;  Service: Cardiovascular;  Laterality: N/A;   LEFT HEART CATHETERIZATION WITH CORONARY ANGIOGRAM N/A 03/17/2012   Procedure: LEFT HEART CATHETERIZATION WITH CORONARY ANGIOGRAM;  Surgeon: Vinie KYM Maxcy, MD;  Location: [facility] CATH LAB;  Service: Cardiovascular;  Laterality: N/A;   LOOP RECORDER INSERTION N/A 07/11/2023   Procedure: LOOP RECORDER INSERTION;  Surgeon: Nancey Eulas BRAVO, MD;  Location: [facility] INVASIVE CV LAB;  Service: Cardiovascular;  Laterality: N/A;   PERCUTANEOUS CORONARY STENT INTERVENTION (PCI-S)  03/17/2012   Procedure: PERCUTANEOUS CORONARY STENT INTERVENTION (PCI-S);  Surgeon: Vinie KYM Maxcy, MD;  Location: [facility] CATH LAB;  Service: Cardiovascular;;   POLYPECTOMY  02/25/2017   Procedure: POLYPECTOMY;  Surgeon: Harvey Margo CROME, MD;   [facility];  Service: Endoscopy;;  cecal x2; ascending x4;hepatic flexure;splenic flexure; descending x2;   POLYPECTOMY  04/15/2020   Procedure: POLYPECTOMY;  Surgeon: Cindie Carlin POUR, DO;  [facility]: [facility];  Service: Endoscopy;;  cecal    TRANSTHORACIC ECHOCARDIOGRAM  03/29/2006   EF normal; mild MR & TR; AV mildly sclerotic    FAMHx:  Family History  Problem Relation Age of Onset   Heart disease Brother        x3   Stroke Father     SOCHx:   reports that he has been smoking cigarettes. He has a 4 pack-year smoking history. He has never used smokeless tobacco. He reports that he does not drink alcohol and does not use drugs.  ALLERGIES:  No Known Allergies  ROS: Pertinent items noted in HPI and remainder of comprehensive ROS otherwise negative.  HOME MEDS: Current Outpatient Medications  Medication Sig Dispense Refill   acetaminophen  (TYLENOL ) 500 MG tablet Take 500 mg by mouth every 6 (six) hours as needed for moderate pain.     albuterol  (VENTOLIN  HFA) 108 (90 Base) MCG/ACT inhaler Inhale 2 puffs into the lungs every 4 (four) hours as needed for wheezing or shortness of breath.     amLODipine  (NORVASC ) 5 MG tablet Take 5 mg by mouth daily.     aspirin  EC 81 MG tablet Take 81 mg by mouth daily. Swallow whole.     gabapentin  (NEURONTIN ) 300 MG capsule Take 300 mg by mouth daily as needed (pain).     loratadine  (CLARITIN ) 10 MG tablet Take 10 mg by mouth daily.     nitroGLYCERIN  (NITROSTAT ) 0.4 MG SL tablet Place 1 tablet (0.4 mg total) under the tongue every 5 (five) minutes as needed for chest pain (up to 3 doses). 25 tablet 3  rosuvastatin  (CRESTOR ) 10 MG tablet Take 10 mg by mouth daily.     tamsulosin  (FLOMAX ) 0.4 MG CAPS capsule Take 1 capsule (0.4 mg total) by mouth in the morning and at bedtime. 60 capsule 11   clopidogrel  (PLAVIX ) 75 MG tablet Take 1 tablet (75 mg total) by mouth daily. (Patient not taking: Reported on 12/27/2023) 30 tablet 2   No  current facility-administered medications for this visit.    LABS/IMAGING: No results found for this or any previous visit (from the past 48 hours). No results found.  VITALS: BP 134/78   Pulse (!) 58   Ht 6' (1.829 m)   Wt 141 lb (64 kg)   SpO2 100%   BMI 19.12 kg/m   EXAM: General appearance: alert and no distress Neck: no carotid bruit and no JVD Lungs: clear to auscultation bilaterally Heart: regular rate and rhythm, S1, S2 normal, no murmur, click, rub or gallop Abdomen: soft, non-tender; bowel sounds normal; no masses,  no organomegaly Extremities: extremities normal, atraumatic, no cyanosis or edema Pulses: 2+ and symmetric Skin: Skin color, texture, turgor normal. No rashes or lesions Neurologic: Grossly normal Psych: Mood, affect normal  EKG: Deferred  ASSESSMENT: Coronary artery disease status post PCI to the mid to distal LAD with a Xience DES (03/2012) and repeat PCI to the distal LAD (06/2022) Dyslipidemia - at goal Hypertension - controlled Tobacco dependence -ready to quit Anterolateral and inferior T-wave abnormalities-stable Possible prostate cancer versus BPH  PLAN: 1.   Robert Shaw seems to be doing well although had a stroke in May.  He has a loop recorder but is failed to show any atrial fibrillation.  His cholesterol has been well treated.  Blood pressure is at goal.  He is going to get evaluation and possible treatment of an AVM by neuroradiology next week.  He was diagnosed with prostate cancer but now tells me he may not have had that perhaps he has BPH.  He is on tamsulosin  for that and urinating well.  No changes to his medicines today.  Follow-up with me annually or sooner as necessary.  Vinie KYM Maxcy, MD, [facility], FNLA, FACP  [place]  [facility]  Medical Director of the [facility]  Card[facility]ovascular Risk Reduction [facility] Diplomate of the American Board of Clinical Lipidology Attending Cardiologist  Direct Dial:  [phone]  Fax: [phone]  Website:  [email or web address] Vinie BROCKS Bensen [place] 12/27/2023, 9:46 AM

## 2023-12-27 NOTE — Patient Instructions (Addendum)
 Medication Instructions:  Your physician has recommended you make the following change in your medication:  Stop taking Plavix  Continue taking all other medications as prescribed  Labwork: None  Testing/Procedures: None  Follow-Up: Your physician recommends that you schedule a follow-up appointment in: 1 year. You will receive a reminder call in about 8 months reminding you to schedule your appointment. If you don't receive this call, please contact our office.   Any Other Special Instructions Will Be Listed Below (If Applicable). Thank you for choosing [facility]!     If you need a refill on your cardiac medications before your next appointment, please call your pharmacy.

## 2023-12-27 NOTE — Progress Notes (Signed)
 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   Last visit with cardiologist Dr. Mona was on 12/27/2023. In May 2025 he had a right posterior MCA CVA s/p TNK. 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 He noted plans for possible treatment next week. Loop recorder placed and has not shown any evidence of afib so far. He denied chest pain and SOB. Still smoking. One year follow-up planned.  Cerebral arteriogram on 11/04/2023 showed a high-flow left frontal dural arteriovenous fistula with markedly ectatic pial venous drainage. Dr. Lester discussed options at 11/14/2023 and now scheduled for dural fistula embolization.   He is on ASA 81 mg daily. He reported no longer being on Plavix .   Anesthesia team to evaluate on the day of procedure.    VS: Ht 6' (1.829 m)   Wt 65.8 kg   BMI 19.67 kg/m  BP Readings from Last 3 Encounters:  12/27/23 134/78  11/14/23 122/72  11/04/23 (!) 134/58   Pulse Readings from Last 3 Encounters:  12/27/23 (!) 58  11/14/23 70  11/04/23 (!) 54     PROVIDERS: Carlette Benita Area, MD is PCP  Mona Vinie BROCKS, MD is  cardiologist Mealor, Eulas, MD is EP (loop recorder) Sherrilee Dover, MD is urologist Onita Duos, MD is neurologist   LABS: For day of surgery as indicated. Last results in [facility] include: Lab Results  Component Value Date   WBC 4.8 11/04/2023   HGB 12.8 (L) 11/04/2023   HCT 37.8 (L) 11/04/2023   PLT 148 (L) 11/04/2023   GLUCOSE 105 (H) 07/11/2023   CHOL 86 07/11/2023   TRIG 56 07/11/2023   HDL 36 (L) 07/11/2023   LDLCALC 39 07/11/2023   ALT 13 07/10/2023   AST 23 07/10/2023   NA 138 07/11/2023   K 3.7 07/11/2023   CL 109 07/11/2023   CREATININE 1.40 (H) 07/11/2023   BUN 7 (L) 07/11/2023   CO2 23 07/11/2023   TSH 0.931 06/27/2022   INR 1.1 07/10/2023   HGBA1C 5.3 07/11/2023     IMAGES: Cerebral arteriogram 11/04/2023: IMPRESSION: High-flow left frontal dural arteriovenous fistula with markedly ectatic pial venous drainage  MRI 07/11/2023: MPRESSION: 1. Positive for Acute posterior Right MCA territory infarct, affecting the post sensory gyrus and associated white matter tracks. No hemorrhage or mass effect. 2. 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 See CTA findings and recommendation yesterday. 3. Small chronic infarct in the left occipital pole.  CTA Head/Neck 07/10/2023: IMPRESSION: 1. Negative for large vessel occlusion. 2. Positive for a left hemisphere intracranial vascular malformation. This may be most likely a Left MCA  associated Dural Venous Fistula, as it seems to lack some features typical of a high flow AVM. Neuro endovascular consultation is suggested to evaluate the appropriateness of potential treatment. 3. No significant extracranial atherosclerosis or stenosis. Intracranial atherosclerosis primarily limited to the ICA siphons where there is up to moderate bilateral siphon stenosis due to calcified plaque. 4.   Aortic Atherosclerosis (ICD10-I70.0). 5.  Emphysema (ICD10-J43.9).   EKG: 07/11/2023: Sinus rhythm Probable anteroseptal infarct, old Nonspecific T abnormalities, lateral leads Confirmed by Lenor Hollering [phone]) on 07/11/2023 8:18:44 AM   CV: Echo 07/11/2023: IMPRESSIONS   1. Left ventricular ejection fraction, by estimation, is 60 to 65%. The  left ventricle has normal function. The left ventricle has no regional  wall motion abnormalities. Left ventricular diastolic parameters were  normal.   2. Right ventricular systolic function is normal. The right ventricular  size is normal. Tricuspid regurgitation signal is inadequate for assessing  PA pressure.   3. The mitral valve is normal in structure. Trivial mitral valve  regurgitation. No evidence of mitral stenosis.   4. The aortic valve was not well visualized. Aortic valve regurgitation  is trivial. Aortic valve sclerosis/calcification is present, without any  evidence of aortic stenosis.   5. The inferior vena cava is normal in size with greater than 50%  respiratory variability, suggesting right atrial pressure of 3 mmHg.    Cardiac cath 06/26/2022:   Dist LAD lesion is 90% stenosed.   Lat 1st Diag lesion is 90% stenosed.   Prox RCA to Mid RCA lesion is 60% stenosed.   Previously placed Mid LAD to Dist LAD stent of unknown type is  widely patent.   Previously placed 1st Diag stent of unknown type is  widely patent.   Previously placed Prox Cx to Mid Cx stent of unknown type is  widely patent.   A drug-eluting stent was successfully placed using a SYNERGY XD 2.25X12.   Post intervention, there is a 0% residual stenosis.   Post intervention, there is a 0% residual stenosis.   The left ventricular systolic function is normal.   LV end diastolic pressure is mildly elevated.   The left ventricular ejection fraction is 55-65% by visual estimate.   Continued patency of stents in the mid LAD, first diagonal, LCx. New culprit  lesion in the distal LAD at distal stent margin. Sub branch of the fist diagonal is too small for PCI. The larger diagonal branch is patent. Overall good LV function with inferoapical wall motion abnormality from wrap around LAD Mildly elevated LVEDP 17 mm Hg Successful PCI of the distal LAD with DES x 1 overlapping with old stent   Plan: DAPT for one year. Anticipate DC in am.    Past Medical History:  Diagnosis Date   Asthma    Bradycardia 03/18/2012   Coronary artery disease    Dyslipidemia    Family history of heart disease    History of nuclear stress test 07/13/2008   dipyridamole; normal perfusion, no ischemia, low risk    Hypertension    Neuromuscular disorder (HCC)    Sciatica   NSTEMI (non-ST elevated myocardial infarction) (HCC)    h/o   Stroke (HCC)    Tobacco abuse     Past Surgical History:  Procedure Laterality Date   CARDIAC CATHETERIZATION  04/2001   after high lateral MI, preserved LV function w/mild mid anterolateral hypokinesia, diffuse coronary irregularity with signficiant obstruction in LAD, OM1, superior branch of ramus (Dr. Lupita Pike - [facility])   CARDIAC CATHETERIZATION  09/23/2001   patent stents from 06/2001 (Dr. DOROTHA Schwalbe)   CARDIAC CATHETERIZATION  09/17/2002   no restenosis at prior intervention sites (Dr. IVAR Sor)   CARDIAC CATHETERIZATION  09/09/2009   no significant CAD, patent stents in OM1 & LAD (Dr. IVAR Sor)    COLONOSCOPY N/A 02/25/2017   Procedure: COLONOSCOPY;  Surgeon: Harvey Margo CROME, MD;  Location[facility];  Service: Endoscopy;  Laterality: N/A;  10:30 Am   COLONOSCOPY WITH PROPOFOL  N/A 04/15/2020   Procedure: COLONOSCOPY WITH PROPOFOL ;  Surgeon: Cindie Carlin POUR, DO;  Location: [facility];  Service: Endoscopy;  Laterality: N/A;  pm ASA II/   CORONARY ANGIOPLASTY WITH STENT PLACEMENT  06/19/2001   Cfx (3.5x94mm Zeta stent) & diagonal stenting (2.5x53mm pixel stent) w/moderate LAD disease (Dr. IVAR Sor)    CORONARY ANGIOPLASTY WITH STENT PLACEMENT  03/17/2012   Xience DES (2.25x17mm) of mid-distal LAD (Dr. IVAR Sor)   CORONARY STENT INTERVENTION N/A 06/26/2022   Procedure: CORONARY STENT INTERVENTION;  Surgeon: Jordan, Peter M, MD;  Location: [facility] INVASIVE CV LAB;  Service: Cardiovascular;  Laterality: N/A;   IR ANGIO INTRA EXTRACRAN SEL COM CAROTID INNOMINATE BILAT MOD SED  11/04/2023   IR ANGIO VERTEBRAL SEL VERTEBRAL UNI R MOD SED  11/04/2023   IR US  GUIDE VASC ACCESS RIGHT  11/04/2023   LEFT HEART CATH AND CORONARY ANGIOGRAPHY N/A 06/26/2022   Procedure: LEFT HEART CATH AND CORONARY ANGIOGRAPHY;  Surgeon: Jordan, Peter M, MD;  Location[facility] INVASIVE CV LAB;  Service: Cardiovascular;  Laterality: N/A;   LEFT HEART CATHETERIZATION WITH CORONARY ANGIOGRAM N/A 03/17/2012   Procedure: LEFT HEART CATHETERIZATION WITH CORONARY ANGIOGRAM;  Surgeon: Vinie KYM Maxcy, MD;  Location: [facility] CATH LAB;  Service: Cardiovascular;  Laterality: N/A;   LOOP RECORDER INSERTION N/A 07/11/2023   Procedure: LOOP RECORDER INSERTION;  Surgeon: Nancey Eulas BRAVO, MD;  Location: [facility] INVASIVE CV LAB;  Service: Cardiovascular;  Laterality: N/A;   PERCUTANEOUS CORONARY STENT INTERVENTION (PCI-S)  03/17/2012   Procedure: PERCUTANEOUS CORONARY STENT INTERVENTION (PCI-S);  Surgeon: Vinie KYM Maxcy, MD;  Location: [facility] CATH LAB;  Service: Cardiovascular;;   POLYPECTOMY  02/25/2017   Procedure: POLYPECTOMY;  Surgeon: Harvey Margo CROME, MD;  Location[facility];  Service: Endoscopy;;  cecal x2; ascending x4;hepatic flexure;splenic flexure; descending x2;   POLYPECTOMY  04/15/2020   Procedure: POLYPECTOMY;  Surgeon: Cindie Carlin POUR, DO;  Location: [facility];  Service: Endoscopy;;  cecal    TRANSTHORACIC ECHOCARDIOGRAM  03/29/2006   EF normal; mild MR & TR; AV mildly sclerotic    MEDICATIONS: No current facility-administered medications for this encounter.    acetaminophen  (TYLENOL ) 500 MG tablet   albuterol  (VENTOLIN  HFA) 108  (90 Base) MCG/ACT inhaler   amLODipine  (NORVASC ) 5 MG tablet   aspirin  EC 81 MG tablet   gabapentin  (NEURONTIN ) 300 MG capsule   loratadine  (CLARITIN ) 10 MG tablet   nitroGLYCERIN  (NITROSTAT ) 0.4 MG SL tablet   rosuvastatin  (CRESTOR ) 10 MG tablet   tamsulosin  (FLOMAX ) 0.4 MG CAPS capsule    Isaiah Ruder, PA-C Surgical Short Stay/Anesthesiology [facility] Phone [phone] [facility] Phone [phone] 12/27/2023 4:08 PM

## 2023-12-29 NOTE — Progress Notes (Signed)
----------  [facility] PROCEDURAL EXPEDITER NOTE---------------  Patient Name: Robert Shaw Patient DOB: 11/11/1950 Today's Date: @TODAY @   Chart reviewed:  Yes  Documentation gaps: N Orders in place: Yes  Communication with surgical team if no orders: N/A Labs, test, and orders reviewed: Y - Anesthesia Order Set placed Requires surgical clearance: No Barriers noted:N/A  Intervention provided by [facility] team: N/A Barrier resolved: N/A    Devere Penner, RN  [facility]

## 2023-12-30 ENCOUNTER — Encounter (HOSPITAL_COMMUNITY): Payer: Self-pay | Admitting: Neuroradiology

## 2023-12-30 ENCOUNTER — Inpatient Hospital Stay (HOSPITAL_COMMUNITY): Payer: Self-pay | Admitting: Vascular Surgery

## 2023-12-30 ENCOUNTER — Encounter (HOSPITAL_COMMUNITY): Admission: RE | Disposition: A | Payer: Self-pay | Source: Home / Self Care | Attending: Neuroradiology

## 2023-12-30 ENCOUNTER — Inpatient Hospital Stay (HOSPITAL_COMMUNITY)
Admission: RE | Admit: 2023-12-30 | Discharge: 2023-12-30 | Disposition: A | Discharge status: Home / Self Care | Source: Ambulatory Visit | Attending: Neuroradiology | Admitting: Neuroradiology

## 2023-12-30 ENCOUNTER — Inpatient Hospital Stay (HOSPITAL_COMMUNITY)
Admission: RE | Admit: 2023-12-30 | Discharge: 2023-12-31 | DRG: 027 | Disposition: A | Discharge status: Home / Self Care | Attending: Neuroradiology | Admitting: Neuroradiology

## 2023-12-30 ENCOUNTER — Other Ambulatory Visit: Payer: Self-pay

## 2023-12-30 DIAGNOSIS — I671 Cerebral aneurysm, nonruptured: Secondary | ICD-10-CM

## 2023-12-30 DIAGNOSIS — Z7982 Long term (current) use of aspirin: Secondary | ICD-10-CM | POA: Diagnosis not present

## 2023-12-30 DIAGNOSIS — J45909 Unspecified asthma, uncomplicated: Secondary | ICD-10-CM | POA: Diagnosis present

## 2023-12-30 DIAGNOSIS — I10 Essential (primary) hypertension: Secondary | ICD-10-CM

## 2023-12-30 DIAGNOSIS — I252 Old myocardial infarction: Secondary | ICD-10-CM

## 2023-12-30 DIAGNOSIS — Z01818 Encounter for other preprocedural examination: Principal | ICD-10-CM

## 2023-12-30 DIAGNOSIS — Z8249 Family history of ischemic heart disease and other diseases of the circulatory system: Secondary | ICD-10-CM | POA: Diagnosis not present

## 2023-12-30 DIAGNOSIS — I6602 Occlusion and stenosis of left middle cerebral artery: Secondary | ICD-10-CM | POA: Diagnosis not present

## 2023-12-30 DIAGNOSIS — E785 Hyperlipidemia, unspecified: Secondary | ICD-10-CM | POA: Diagnosis present

## 2023-12-30 DIAGNOSIS — Z955 Presence of coronary angioplasty implant and graft: Secondary | ICD-10-CM

## 2023-12-30 DIAGNOSIS — Z79899 Other long term (current) drug therapy: Secondary | ICD-10-CM | POA: Diagnosis not present

## 2023-12-30 DIAGNOSIS — F17210 Nicotine dependence, cigarettes, uncomplicated: Secondary | ICD-10-CM | POA: Diagnosis present

## 2023-12-30 DIAGNOSIS — Z823 Family history of stroke: Secondary | ICD-10-CM | POA: Diagnosis not present

## 2023-12-30 DIAGNOSIS — Z8673 Personal history of transient ischemic attack (TIA), and cerebral infarction without residual deficits: Secondary | ICD-10-CM | POA: Diagnosis not present

## 2023-12-30 DIAGNOSIS — I2510 Atherosclerotic heart disease of native coronary artery without angina pectoris: Secondary | ICD-10-CM | POA: Diagnosis present

## 2023-12-30 HISTORY — PX: IR US GUIDE VASC ACCESS RIGHT: IMG2390

## 2023-12-30 HISTORY — DX: Unspecified asthma, uncomplicated: J45.909

## 2023-12-30 HISTORY — PX: RADIOLOGY WITH ANESTHESIA: SHX6223

## 2023-12-30 HISTORY — PX: IR TRANSCATH/EMBOLIZ: IMG695

## 2023-12-30 HISTORY — DX: Myoneural disorder, unspecified: G70.9

## 2023-12-30 HISTORY — PX: IR ANGIO INTRA EXTRACRAN SEL INTERNAL CAROTID UNI L MOD SED: IMG5361

## 2023-12-30 HISTORY — DX: Cerebral infarction, unspecified: I63.9

## 2023-12-30 HISTORY — PX: IR ANGIO EXTERNAL CAROTID SEL EXT CAROTID UNI L MOD SED: IMG5370

## 2023-12-30 HISTORY — PX: IR NEURO EACH ADD'L AFTER BASIC UNI LEFT (MS): IMG5373

## 2023-12-30 LAB — CBC
HCT: 34.2 % — ABNORMAL LOW (ref 39.0–52.0)
Hemoglobin: 11.8 g/dL — ABNORMAL LOW (ref 13.0–17.0)
MCH: 31.3 pg (ref 26.0–34.0)
MCHC: 34.5 g/dL (ref 30.0–36.0)
MCV: 90.7 fL (ref 80.0–100.0)
Platelets: 130 K/uL — ABNORMAL LOW (ref 150–400)
RBC: 3.77 MIL/uL — ABNORMAL LOW (ref 4.22–5.81)
RDW: 13.5 % (ref 11.5–15.5)
WBC: 4.3 K/uL (ref 4.0–10.5)
nRBC: 0 % (ref 0.0–0.2)

## 2023-12-30 LAB — BASIC METABOLIC PANEL WITH GFR
Anion gap: 7 (ref 5–15)
BUN: 15 mg/dL (ref 8–23)
CO2: 24 mmol/L (ref 22–32)
Calcium: 8.5 mg/dL — ABNORMAL LOW (ref 8.9–10.3)
Chloride: 107 mmol/L (ref 98–111)
Creatinine, Ser: 1.35 mg/dL — ABNORMAL HIGH (ref 0.61–1.24)
GFR, Estimated: 56 mL/min — ABNORMAL LOW (ref >60)
Glucose, Bld: 101 mg/dL — ABNORMAL HIGH (ref 70–99)
Potassium: 4.2 mmol/L (ref 3.5–5.1)
Sodium: 138 mmol/L (ref 135–145)

## 2023-12-30 LAB — POCT ACTIVATED CLOTTING TIME: Activated Clotting Time: 245 s

## 2023-12-30 SURGERY — RADIOLOGY WITH ANESTHESIA
Anesthesia: General

## 2023-12-30 MED ORDER — LABETALOL HCL 5 MG/ML IV SOLN
INTRAVENOUS | Status: DC | PRN
  Administered 2023-12-30: 10 mg via INTRAVENOUS

## 2023-12-30 MED ORDER — SODIUM CHLORIDE 0.9 % IV BOLUS: 250.0000 mL | INTRAVENOUS | Status: DC | PRN

## 2023-12-30 MED ORDER — CHLORHEXIDINE GLUCONATE 0.12 % MT SOLN
15.0000 mL | Freq: Once | OROMUCOSAL | Status: AC
  Administered 2023-12-30: 15 mL via OROMUCOSAL
  Filled 2023-12-30: qty 15

## 2023-12-30 MED ORDER — IOHEXOL 300 MG/ML  SOLN
150.0000 mL | Freq: Once | INTRAMUSCULAR | Status: AC | PRN
  Administered 2023-12-30: 60 mL via INTRA_ARTERIAL

## 2023-12-30 MED ORDER — DEXAMETHASONE SOD PHOSPHATE PF 10 MG/ML IJ SOLN
INTRAMUSCULAR | Status: DC | PRN
  Administered 2023-12-30: 4 mg via INTRAVENOUS

## 2023-12-30 MED ORDER — ROCURONIUM BROMIDE 10 MG/ML (PF) SYRINGE
PREFILLED_SYRINGE | INTRAVENOUS | Status: DC | PRN
  Administered 2023-12-30: 20 mg via INTRAVENOUS
  Administered 2023-12-30: 50 mg via INTRAVENOUS
  Administered 2023-12-30: 30 mg via INTRAVENOUS

## 2023-12-30 MED ORDER — ACETAMINOPHEN 500 MG PO TABS: 500.0000 mg | ORAL_TABLET | Freq: Four times a day (QID) | ORAL | Status: DC | PRN

## 2023-12-30 MED ORDER — ASPIRIN 81 MG PO TBEC
81.0000 mg | DELAYED_RELEASE_TABLET | Freq: Every day | ORAL | Status: DC
  Administered 2023-12-30: 81 mg via ORAL
  Filled 2023-12-30 (×2): qty 1

## 2023-12-30 MED ORDER — ONDANSETRON HCL 4 MG/2ML IJ SOLN
INTRAMUSCULAR | Status: DC | PRN
  Administered 2023-12-30: 4 mg via INTRAVENOUS

## 2023-12-30 MED ORDER — ORAL CARE MOUTH RINSE: 15.0000 mL | Freq: Once | OROMUCOSAL | Status: AC

## 2023-12-30 MED ORDER — ALBUTEROL SULFATE (2.5 MG/3ML) 0.083% IN NEBU: 2.5000 mg | INHALATION_SOLUTION | RESPIRATORY_TRACT | Status: DC | PRN

## 2023-12-30 MED ORDER — ACETAMINOPHEN 10 MG/ML IV SOLN: 1000.0000 mg | Freq: Once | INTRAVENOUS | Status: DC | PRN

## 2023-12-30 MED ORDER — HEPARIN SODIUM (PORCINE) 1000 UNIT/ML IJ SOLN
INTRAMUSCULAR | Status: AC
  Filled 2023-12-30: qty 10

## 2023-12-30 MED ORDER — SUGAMMADEX SODIUM 200 MG/2ML IV SOLN
INTRAVENOUS | Status: DC | PRN
  Administered 2023-12-30: 200 mg via INTRAVENOUS

## 2023-12-30 MED ORDER — OXYCODONE HCL 5 MG/5ML PO SOLN: 5.0000 mg | Freq: Once | ORAL | Status: DC | PRN

## 2023-12-30 MED ORDER — ROSUVASTATIN CALCIUM 5 MG PO TABS
10.0000 mg | ORAL_TABLET | Freq: Every day | ORAL | Status: DC
  Administered 2023-12-30: 10 mg via ORAL
  Filled 2023-12-30 (×2): qty 2

## 2023-12-30 MED ORDER — ONDANSETRON HCL 4 MG/2ML IJ SOLN: 4.0000 mg | Freq: Once | INTRAMUSCULAR | Status: DC | PRN

## 2023-12-30 MED ORDER — HEPARIN SODIUM (PORCINE) 1000 UNIT/ML IJ SOLN
INTRAMUSCULAR | Status: DC | PRN
  Administered 2023-12-30: 4000 [IU] via INTRAVENOUS

## 2023-12-30 MED ORDER — LIDOCAINE 2% (20 MG/ML) 5 ML SYRINGE
INTRAMUSCULAR | Status: DC | PRN
  Administered 2023-12-30: 100 mg via INTRAVENOUS

## 2023-12-30 MED ORDER — AMLODIPINE BESYLATE 5 MG PO TABS
5.0000 mg | ORAL_TABLET | Freq: Every day | ORAL | Status: DC
  Filled 2023-12-30: qty 1

## 2023-12-30 MED ORDER — VERAPAMIL HCL 2.5 MG/ML IV SOLN: INTRA_ARTERIAL | Status: AC | PRN

## 2023-12-30 MED ORDER — NITROGLYCERIN 1 MG/10 ML FOR IR/CATH LAB
INTRA_ARTERIAL | Status: AC
  Filled 2023-12-30: qty 10

## 2023-12-30 MED ORDER — NITROGLYCERIN 0.4 MG SL SUBL
0.4000 mg | SUBLINGUAL_TABLET | SUBLINGUAL | Status: DC | PRN
Start: 2023-12-30 — End: 2023-12-31

## 2023-12-30 MED ORDER — VERAPAMIL HCL 2.5 MG/ML IV SOLN
INTRAVENOUS | Status: AC
  Filled 2023-12-30: qty 2

## 2023-12-30 MED ORDER — TAMSULOSIN HCL 0.4 MG PO CAPS
0.4000 mg | ORAL_CAPSULE | Freq: Every day | ORAL | Status: DC
  Filled 2023-12-30: qty 1

## 2023-12-30 MED ORDER — SODIUM CHLORIDE 0.9 % IV SOLN: INTRAVENOUS | Status: DC

## 2023-12-30 MED ORDER — LACTATED RINGERS IV SOLN: INTRAVENOUS | Status: DC | PRN

## 2023-12-30 MED ORDER — PROPOFOL 10 MG/ML IV BOLUS
INTRAVENOUS | Status: DC | PRN
  Administered 2023-12-30: 100 mg via INTRAVENOUS
  Administered 2023-12-30: 30 mg via INTRAVENOUS

## 2023-12-30 MED ORDER — OXYCODONE HCL 5 MG PO TABS: 5.0000 mg | ORAL_TABLET | Freq: Once | ORAL | Status: DC | PRN

## 2023-12-30 MED ORDER — HYDROMORPHONE HCL 1 MG/ML IJ SOLN: 0.2500 mg | INTRAMUSCULAR | Status: DC | PRN

## 2023-12-30 NOTE — Anesthesia Postprocedure Evaluation (Signed)
 Anesthesia Post Note  Patient: Robert Shaw  Procedure(s) Performed: RADIOLOGY WITH ANESTHESIA     Patient location during evaluation: PACU Anesthesia Type: General Level of consciousness: awake and alert Pain management: pain level controlled Vital Signs Assessment: post-procedure vital signs reviewed and stable Respiratory status: spontaneous breathing, nonlabored ventilation, respiratory function stable and patient connected to nasal cannula oxygen Cardiovascular status: blood pressure returned to baseline and stable Postop Assessment: no apparent nausea or vomiting Anesthetic complications: no   No notable events documented.  Last Vitals:  Vitals:   12/30/23 1300 12/30/23 1315  BP: 135/66 (!) 142/65  Pulse: (!) 49 (!) 48  Resp: 17 13  Temp:    SpO2: 98% 99%    Last Pain:  Vitals:   12/30/23 1200  TempSrc:   PainSc: Asleep                 Garnette [name]

## 2023-12-30 NOTE — Sedation Documentation (Signed)
 Pt under care of anesthesia. See CRNA charting.

## 2023-12-30 NOTE — Brief Op Note (Signed)
  NEUROSURGERY BRIEF OP NOTE   PREOP DX: DAVF  POSTOP DX: Same  PROCEDURE: embolization with onyx  SURGEON: Nancyann LULLA Burns   ANESTHESIA: GETA  EBL: 50  COMPLICATIONS: No immediate  CONDITION: Stable  FINDINGS:  High flow DAVF, embolized with onyx from left middle meningeal artery.  Nancyann LULLA Burns  @today @ 10:04 AM

## 2023-12-30 NOTE — Progress Notes (Signed)
 Patient arrived to the floor with personal belongings (clothes) and knee immobilizer from PACU. Patient is alert & oriented x4, and expresses no concerns or pain. Patient is oriented to room with bed in lowest position and call bell nearby. Patient's wife is now at bedside with patient's bedtime medications in a clear bag. This RN notified patient and wife of personal medications protocol and she states, My son will take the medications home.   12/30/23 1749  Vitals  Temp 97.8 F (36.6 C)  Temp Source Oral  BP 135/65  MAP (mmHg) 86  BP Location Right Arm  BP Method Automatic  Patient Position (if appropriate) Lying  Pulse Rate (!) 59  Pulse Rate Source Monitor  ECG Heart Rate 60  Resp 20  Level of Consciousness  Level of Consciousness Alert  MEWS COLOR  MEWS Score Color Green  Oxygen Therapy  SpO2 100 %  O2 Device Room Air  Pain Assessment  Pain Scale 0-10  Pain Score 0  MEWS Score  MEWS Temp 0  MEWS Systolic 0  MEWS Pulse 0  MEWS RR 0  MEWS LOC 0  MEWS Score 0

## 2023-12-30 NOTE — Sedation Documentation (Signed)
 ACT @ 245

## 2023-12-30 NOTE — Sedation Documentation (Addendum)
 Onyx deployed in the left middle meningeal vessel.

## 2023-12-30 NOTE — H&P (Signed)
 Chief Complaint: He is here for elective treatment of by high flow dural arteriovenous fistula   HPI:  Robert Shaw is a 72 y.o. male who initially presented in May 2025 with a small right parietal infarct and was found to have an incidental high flow left dural arteriovenous fistula with pial venous drainage and extensive venous ectasia.  He underwent a diagnostic arteriogram on November 04, 2023 which demonstrated the lesion, the extensive venous dilations and venous aneurysms, and the very high flow nature of the lesion.  I saw him in the office to discuss the lesion, the natural history of this type of abnormality, and the risk of hemorrhage posed by the abnormality.  Given his age, I gave him the option of pursuing treatment in order to lessen the risk of hemorrhage, but also the option of observation as, despite the worrisome nature of the abnormality, it is asymptomatic.  After considering his options he elected to proceed with treatment and is here today for treatment of the fistula.  I have explained to him the nature of the procedure and the risks of the procedure, especially the risk of stroke.  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  Right radial access.   History of Present Illness     Past Medical History:  Diagnosis Date   Asthma    Bradycardia 03/18/2012   Coronary artery disease    Dyslipidemia    Family history of heart disease    History of nuclear stress test 07/13/2008   dipyridamole; normal perfusion, no ischemia, low risk    Hypertension    Neuromuscular disorder (HCC)    Sciatica   NSTEMI (non-ST elevated myocardial infarction) (HCC)    h/o   Stroke (HCC)    Tobacco abuse     Past Surgical History:  Procedure Laterality Date   CARDIAC CATHETERIZATION  04/2001   after high lateral MI, preserved LV function w/mild mid anterolateral hypokinesia, diffuse coronary irregularity with  signficiant obstruction in LAD, OM1, superior branch of ramus (Dr. Lupita Pike - [facility])   CARDIAC CATHETERIZATION  09/23/2001   patent stents from 06/2001 (Dr. DOROTHA Schwalbe)   CARDIAC CATHETERIZATION  09/17/2002   no restenosis at prior intervention sites (Dr. IVAR Sor)   CARDIAC CATHETERIZATION  09/09/2009   no significant CAD, patent stents in OM1 & LAD (Dr. IVAR Sor)    COLONOSCOPY N/A 02/25/2017   Procedure: COLONOSCOPY;  Surgeon: Harvey Margo CROME, MD;  [facility];  Service: Endoscopy;  Laterality: N/A;  10:30 Am   COLONOSCOPY WITH PROPOFOL  N/A 04/15/2020   Procedure: COLONOSCOPY WITH PROPOFOL ;  Surgeon: Cindie Carlin POUR, DO;  [facility];  Service: Endoscopy;  Laterality: N/A;  pm ASA II/   CORONARY ANGIOPLASTY WITH STENT PLACEMENT  06/19/2001   Cfx (3.5x54mm Zeta stent) & diagonal stenting (2.5x71mm pixel stent) w/moderate LAD disease (Dr. IVAR Sor)    CORONARY ANGIOPLASTY WITH STENT PLACEMENT  03/17/2012   Xience DES (2.25x29mm) of mid-distal LAD (Dr. IVAR Sor)   CORONARY STENT INTERVENTION N/A 06/26/2022   Procedure: CORONARY STENT INTERVENTION;  Surgeon: Jordan, Peter M, MD;  Location: [facility] INVASIVE CV LAB;  Service: Cardiovascular;  Laterality: N/A;   IR ANGIO INTRA EXTRACRAN SEL COM CAROTID INNOMINATE BILAT MOD SED  11/04/2023   IR ANGIO VERTEBRAL SEL VERTEBRAL UNI R MOD SED  11/04/2023   IR US  GUIDE VASC ACCESS RIGHT  11/04/2023  LEFT HEART CATH AND CORONARY ANGIOGRAPHY N/A 06/26/2022   Procedure: LEFT HEART CATH AND CORONARY ANGIOGRAPHY;  Surgeon: Jordan, Peter M, MD;  Location: [facility] INVASIVE CV LAB;  Service: Cardiovascular;  Laterality: N/A;   LEFT HEART CATHETERIZATION WITH CORONARY ANGIOGRAM N/A 03/17/2012   Procedure: LEFT HEART CATHETERIZATION WITH CORONARY ANGIOGRAM;  Surgeon: Vinie KYM Maxcy, MD;  Location: [facility] CATH LAB;  Service: Cardiovascular;  Laterality: N/A;   LOOP RECORDER INSERTION N/A 07/11/2023   Procedure: LOOP RECORDER  INSERTION;  Surgeon: Nancey Eulas BRAVO, MD;  Location: [facility] INVASIVE CV LAB;  Service: Cardiovascular;  Laterality: N/A;   PERCUTANEOUS CORONARY STENT INTERVENTION (PCI-S)  03/17/2012   Procedure: PERCUTANEOUS CORONARY STENT INTERVENTION (PCI-S);  Surgeon: Vinie KYM Maxcy, MD;  Location: [facility] CATH LAB;  Service: Cardiovascular;;   POLYPECTOMY  02/25/2017   Procedure: POLYPECTOMY;  Surgeon: Harvey Margo CROME, MD;  [facility];  Service: Endoscopy;;  cecal x2; ascending x4;hepatic flexure;splenic flexure; descending x2;   POLYPECTOMY  04/15/2020   Procedure: POLYPECTOMY;  Surgeon: Cindie Carlin POUR, DO;  [facility];  Service: Endoscopy;;  cecal    TRANSTHORACIC ECHOCARDIOGRAM  03/29/2006   EF normal; mild MR & TR; AV mildly sclerotic    Allergies: Patient has no known allergies.  Medications: Prior to Admission medications   Medication Sig Start Date End Date Taking? Authorizing Provider  albuterol  (VENTOLIN  HFA) 108 (90 Base) MCG/ACT inhaler Inhale 2 puffs into the lungs every 4 (four) hours as needed for wheezing or shortness of breath. 09/16/19  Yes [provider]  amLODipine  (NORVASC ) 5 MG tablet Take 5 mg by mouth daily. 09/27/21  Yes [provider]  aspirin  EC 81 MG tablet Take 81 mg by mouth daily. Swallow whole.   Yes [provider]  loratadine  (CLARITIN ) 10 MG tablet Take 10 mg by mouth daily.   Yes [provider]  nitroGLYCERIN  (NITROSTAT ) 0.4 MG SL tablet Place 1 tablet (0.4 mg total) under the tongue every 5 (five) minutes as needed for chest pain (up to 3 doses). 06/27/22  Yes Dunn, Dayna N, PA-C  rosuvastatin  (CRESTOR ) 10 MG tablet Take 10 mg by mouth daily. 06/07/23  Yes [provider]  tamsulosin  (FLOMAX ) 0.4 MG CAPS capsule Take 1 capsule (0.4 mg total) by mouth in the morning and at bedtime. 10/16/23  Yes McKenzie, Belvie CROME, MD  acetaminophen  (TYLENOL ) 500 MG tablet Take 500 mg by mouth every 6 (six) hours as needed  for moderate pain.    [provider]  gabapentin  (NEURONTIN ) 300 MG capsule Take 300 mg by mouth daily as needed (pain).    [provider]     Family History  Problem Relation Age of Onset   Heart disease Brother        x3   Stroke Father     Social History   Socioeconomic History   Marital status: Married    Spouse name: Not on file   Number of children: 1   Years of education: Not on file   Highest education level: Not on file  Occupational History   Not on file  Tobacco Use   Smoking status: Every Day    Current packs/day: 0.10    Average packs/day: 0.1 packs/day for 40.0 years (4.0 ttl pk-yrs)    Types: Cigarettes   Smokeless tobacco: Never   Tobacco comments:    smokes 3-4 cigarettes daily (it all depends) 10/27/13  Vaping Use   Vaping status: Never Used  Substance  and Sexual Activity   Alcohol use: No   Drug use: No   Sexual activity: Not on file  Other Topics Concern   Not on file  Social History Narrative   Not on file   Social Drivers of Health   Financial Resource Strain: Not on file  Food Insecurity: No Food Insecurity (07/12/2023)   Hunger Vital Sign    Worried About Running Out of Food in the Last Year: Never true    Ran Out of Food in the Last Year: Never true  Transportation Needs: No Transportation Needs (07/12/2023)   PRAPARE - Administrator, Civil Service (Medical): No    Lack of Transportation (Non-Medical): No  Physical Activity: Not on file  Stress: Not on file  Social Connections: Moderately Integrated (07/10/2023)   Social Connection and Isolation Panel    Frequency of Communication with Friends and Family: More than three times a week    Frequency of Social Gatherings with Friends and Family: More than three times a week    Attends Religious Services: More than 4 times per year    Active Member of [employer] or Organizations: No    Attends Banker Meetings: Never    Marital Status: Married     Review of Systems  Respiratory:  Negative for shortness of breath.   Cardiovascular:  Negative for chest pain.    Vital Signs:  BP 134/79   Pulse (!) 45   Temp (!) 97.2 F (36.2 C) (Oral)   Resp 18   Ht 6' (1.829 m)   Wt 63.5 kg   SpO2 100%   BMI 18.99 kg/m   He has occasional wheezes.  Lungs are otherwise clear.  Heart sounds are normal with normal rhythm.  Abdomen is soft and nontender.  Alert and oriented with normal speech expression, fluency and comprehension. Visual fields are full to confrontation.   Face is symmetric. Strength in the arms and legs is symmetric with no drift.  Sensation is normal and symmetric. No ataxia. No inattention.   Physical Exam   Imaging:  See HPI above  Results   Labs:  CBC: Recent Labs    07/10/23 1201 07/10/23 1209 07/11/23 0440 11/04/23 0924 12/30/23 0602  WBC 7.2  --  5.9 4.8 4.3  HGB 13.2 12.9* 12.1* 12.8* 11.8*  HCT 37.8* 38.0* 34.7* 37.8* 34.2*  PLT 173  --  174 148* 130*    COAGS: Recent Labs    07/10/23 1201  INR 1.1  APTT 29    BMP: Recent Labs    04/06/23 0852 07/10/23 1201 07/10/23 1209 07/11/23 0440 12/30/23 0602  NA 138 136 139 138 138  K 4.8 3.3* 3.4* 3.7 4.2  CL 105 103 105 109 107  CO2 25 21*  --  23 24  GLUCOSE 110* 142* 142* 105* 101*  BUN 13 11 9  7* 15  CALCIUM  9.3 9.1  --  8.4* 8.5*  CREATININE 1.37* 1.44* 1.50* 1.40* 1.35*  GFRNONAA 55* 52*  --  54* 56*    LIVER FUNCTION TESTS: Recent Labs    07/10/23 1201  BILITOT 1.3*  AST 23  ALT 13  ALKPHOS 86  PROT 7.3  ALBUMIN 4.2      Electronically Signed: Nancyann LULLA Burns  12/30/2023, 8:00 AM

## 2023-12-30 NOTE — Anesthesia Procedure Notes (Signed)
 Procedure Name: Intubation Date/Time: 12/30/2023 8:22 AM  Performed by: Lockie Flesher, CRNAPre-anesthesia Checklist: Patient identified, Emergency Drugs available, Suction available and Patient being monitored Patient Re-evaluated:Patient Re-evaluated prior to induction Oxygen Delivery Method: Circle System Utilized Preoxygenation: Pre-oxygenation with 100% oxygen Induction Type: IV induction Ventilation: Mask ventilation without difficulty Laryngoscope Size: Mac and 4 Grade View: Grade I Tube type: Oral Tube size: 7.5 mm Number of attempts: 1 Airway Equipment and Method: Stylet and Oral airway Placement Confirmation: ETT inserted through vocal cords under direct vision, positive ETCO2 and breath sounds checked- equal and bilateral Secured at: 23 cm Tube secured with: Tape Dental Injury: Teeth and Oropharynx as per pre-operative assessment

## 2023-12-30 NOTE — Transfer of Care (Signed)
 Immediate Anesthesia Transfer of Care Note  Patient: Robert Shaw Blood  Procedure(s) Performed: RADIOLOGY WITH ANESTHESIA  Patient Location: PACU  Anesthesia Type:General  Level of Consciousness: awake, alert , and oriented  Airway & Oxygen Therapy: Patient Spontanous Breathing and Patient connected to nasal cannula oxygen  Post-op Assessment: Report given to RN and Post -op Vital signs reviewed and stable  Post vital signs: Reviewed and stable  Last Vitals:  Vitals Value Taken Time  BP 117/68 12/30/23 10:30  Temp    Pulse 54 12/30/23 10:33  Resp 17 12/30/23 10:33  SpO2 100 % 12/30/23 10:33  Vitals shown include unfiled device data.  Last Pain:  Vitals:   12/30/23 0648  TempSrc:   PainSc: 0-No pain         Complications: No notable events documented.

## 2023-12-31 ENCOUNTER — Encounter (HOSPITAL_COMMUNITY): Payer: Self-pay | Admitting: Neuroradiology

## 2023-12-31 DIAGNOSIS — I671 Cerebral aneurysm, nonruptured: Principal | ICD-10-CM

## 2023-12-31 NOTE — Discharge Summary (Signed)
 Discharge Summary  Patient ID: Robert Shaw   MRN: [id number]      DOB: 12-30-1950  Date of Admission: 12/30/2023 Date of Discharge: 12/31/2023  Attending Physician:  Lester Golas, MD, Stroke MD Consultant(s):    None  Patient's PCP:  Carlette Benita Area, MD  DISCHARGE DIAGNOSIS: Dural arteriovenous fistula, treated Principal Problem:   Dural arteriovenous fistula   Allergies as of 12/31/2023   No Known Allergies      Medication List     TAKE these medications    acetaminophen  500 MG tablet Commonly known as: TYLENOL  Take 500 mg by mouth every 6 (six) hours as needed for moderate pain.   albuterol  108 (90 Base) MCG/ACT inhaler Commonly known as: VENTOLIN  HFA Inhale 2 puffs into the lungs every 4 (four) hours as needed for wheezing or shortness of breath.   amLODipine  5 MG tablet Commonly known as: NORVASC  Take 5 mg by mouth daily.   aspirin  EC 81 MG tablet Take 81 mg by mouth daily. Swallow whole.   gabapentin  300 MG capsule Commonly known as: NEURONTIN  Take 300 mg by mouth daily as needed (pain).   loratadine  10 MG tablet Commonly known as: CLARITIN  Take 10 mg by mouth daily.   nitroGLYCERIN  0.4 MG SL tablet Commonly known as: NITROSTAT  Place 1 tablet (0.4 mg total) under the tongue every 5 (five) minutes as needed for chest pain (up to 3 doses).   rosuvastatin  10 MG tablet Commonly known as: CRESTOR  Take 10 mg by mouth daily.   tamsulosin  0.4 MG Caps capsule Commonly known as: FLOMAX  Take 1 capsule (0.4 mg total) by mouth in the morning and at bedtime.        LABORATORY STUDIES CBC    Component Value Date/Time   WBC 4.3 12/30/2023 0602   RBC 3.77 (L) 12/30/2023 0602   HGB 11.8 (L) 12/30/2023 0602   HGB 11.8 (L) 09/11/2022 0942   HCT 34.2 (L) 12/30/2023 0602   HCT 35.4 (L) 09/11/2022 0942   PLT 130 (L) 12/30/2023 0602   PLT 219 09/11/2022 0942   MCV 90.7 12/30/2023 0602   MCV 89 09/11/2022 0942   MCH 31.3 12/30/2023 0602    MCHC 34.5 12/30/2023 0602   RDW 13.5 12/30/2023 0602   RDW 14.5 09/11/2022 0942   LYMPHSABS 2.3 07/10/2023 1201   MONOABS 0.3 07/10/2023 1201   EOSABS 0.0 07/10/2023 1201   BASOSABS 0.0 07/10/2023 1201   CMP    Component Value Date/Time   NA 138 12/30/2023 0602   NA 143 01/07/2023 0926   K 4.2 12/30/2023 0602   CL 107 12/30/2023 0602   CO2 24 12/30/2023 0602   GLUCOSE 101 (H) 12/30/2023 0602   BUN 15 12/30/2023 0602   BUN 9 01/07/2023 0926   CREATININE 1.35 (H) 12/30/2023 0602   CREATININE 1.42 (H) 09/14/2014 0823   CALCIUM  8.5 (L) 12/30/2023 0602   PROT 7.3 07/10/2023 1201   PROT 6.6 09/11/2022 0942   ALBUMIN 4.2 07/10/2023 1201   ALBUMIN 4.2 09/11/2022 0942   AST 23 07/10/2023 1201   ALT 13 07/10/2023 1201   ALKPHOS 86 07/10/2023 1201   BILITOT 1.3 (H) 07/10/2023 1201   BILITOT 0.6 09/11/2022 0942   GFRNONAA 56 (L) 12/30/2023 0602   GFRAA 63 10/08/2016 0941   COAGS Lab Results  Component Value Date   INR 1.1 07/10/2023   INR 1.01 03/17/2012   INR 1.14 03/15/2012   Lipid Panel    Component Value  Date/Time   CHOL 86 07/11/2023 0440   CHOL 119 08/03/2022 0816   TRIG 56 07/11/2023 0440   HDL 36 (L) 07/11/2023 0440   HDL 52 08/03/2022 0816   CHOLHDL 2.4 07/11/2023 0440   VLDL 11 07/11/2023 0440   LDLCALC 39 07/11/2023 0440   LDLCALC 51 08/03/2022 0816   HgbA1C  Lab Results  Component Value Date   HGBA1C 5.3 07/11/2023   Urinalysis    Component Value Date/Time   COLORURINE STRAW (A) 04/06/2023 0818   APPEARANCEUR Clear 10/16/2023 1324   LABSPEC 1.008 04/06/2023 0818   PHURINE 6.0 04/06/2023 0818   GLUCOSEU Negative 10/16/2023 1324   HGBUR NEGATIVE 04/06/2023 0818   BILIRUBINUR Negative 10/16/2023 1324   KETONESUR NEGATIVE 04/06/2023 0818   PROTEINUR Negative 10/16/2023 1324   PROTEINUR NEGATIVE 04/06/2023 0818   UROBILINOGEN 0.2 06/17/2007 1054   NITRITE Negative 10/16/2023 1324   NITRITE NEGATIVE 04/06/2023 0818   LEUKOCYTESUR Trace (A)  10/16/2023 1324   LEUKOCYTESUR NEGATIVE 04/06/2023 0818   Urine Drug Screen     Component Value Date/Time   LABOPIA NONE DETECTED 07/10/2023 2215   COCAINSCRNUR NONE DETECTED 07/10/2023 2215   LABBENZ NONE DETECTED 07/10/2023 2215   AMPHETMU NONE DETECTED 07/10/2023 2215   THCU NONE DETECTED 07/10/2023 2215   LABBARB NONE DETECTED 07/10/2023 2215    Alcohol Level    Component Value Date/Time   [facility] <15 07/10/2023 1201     SIGNIFICANT DIAGNOSTIC STUDIES CUP PACEART REMOTE DEVICE CHECK Result Date: 12/16/2023 ILR summary report received. Battery status OK. Normal device function. No new symptom, tachy, brady, or pause episodes. No new AF episodes. 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  HOSPITAL COURSE He was admitted yesterday for elective treatment of the dural fistula.  The surgery was completed without problems and he made an uneventful recovery.  RN Pressure Injury Documentation:     DISCHARGE EXAM Blood pressure 134/78, pulse 63, temperature 97.6 F (36.4 C), temperature source Oral, resp. rate 14, height 6' (1.829 m), weight 63.5 kg, SpO2 100%. Alert and oriented with normal speech expression, fluency and comprehension. Visual fields are full to confrontation.   Face is symmetric. Strength in the arms and legs is symmetric with no drift.  Sensation is normal and symmetric. No ataxia. No inattention.   Discharge Diet       Diet   Diet regular Room service appropriate? Yes; Fluid consistency: Thin   liquids  DISCHARGE PLAN Disposition: Home aspirin  81 mg daily for secondary stroke prevention. Ongoing stroke risk factor control by Primary Care Physician at time of discharge Follow-up with me 1 month 30 minutes were spent preparing  discharge.

## 2023-12-31 NOTE — Plan of Care (Signed)

## 2023-12-31 NOTE — Progress Notes (Signed)
 Discharge education given by discharge nurse. Patient left department. By wheelchair with patient transport.

## 2023-12-31 NOTE — Progress Notes (Signed)
 Discharge Nurse Summary: DC order noted per MD. DC RN at bedside with patient/wife. Patient agreeable with discharge plan. AVS printed/reviewed. PIV removed, skin intact. No DME needs. No home/TOC meds. CP/Edu resolved. Telemonitor returned to charging station. All belongings accounted for. Dressing to radial site, CDI w/o bleeding or drainage. See LDAs. Patient wheeled downstairs for discharge by private auto.   Rosario EMERSON Lund, RN

## 2024-01-01 ENCOUNTER — Telehealth: Payer: Self-pay

## 2024-01-01 NOTE — Transitions of Care (Post Inpatient/ED Visit) (Signed)
   01/01/2024  Name: Robert Shaw MRN: [id number] DOB: [birth year]  Today's TOC FU Call Status: Today's TOC FU Call Status:: Unsuccessful Call (2nd Attempt) Unsuccessful Call (2nd Attempt) Date: 01/01/24  Attempted to reach the patient regarding the most recent Inpatient/ED visit.  Follow Up Plan: Additional outreach attempts will be made to reach the patient to complete the Transitions of Care (Post Inpatient/ED visit) call.   Karron Alvizo J. Desha Bitner RN, MSN [facility], [facility] RN Care Manager Direct Dial: [phone]  Fax: [phone] Website: [email or web address]

## 2024-01-01 NOTE — Transitions of Care (Post Inpatient/ED Visit) (Signed)
   01/01/2024  Name: NAVIN DOGAN MRN: [id number] DOB: [birth year]  Today's TOC FU Call Status: Today's TOC FU Call Status:: Unsuccessful Call (1st Attempt) Unsuccessful Call (1st Attempt) Date: 01/01/24  Attempted to reach the patient regarding the most recent Inpatient/ED visit.  Follow Up Plan: Additional outreach attempts will be made to reach the patient to complete the Transitions of Care (Post Inpatient/ED visit) call.   Sukanya Goldblatt J. Kiko Ripp RN, MSN [facility], [facility] RN Care Manager Direct Dial: [phone]  Fax: [phone] Website: [email or web address]

## 2024-01-01 NOTE — Telephone Encounter (Signed)
 I called the patient and his wife to set up the patient's 4 week post op with Dr. Lester.    We scheduled this appointment.   Patient states that he has some pain around the right eye and down his neck. He is not taking any medication for this at this time. I advised they try OTC medications such as Tylenol  today and let me know tomorrow how he is doing after taking this.   They will call the office tomorrow if this does not help.

## 2024-01-02 ENCOUNTER — Telehealth: Payer: Self-pay

## 2024-01-02 NOTE — Transitions of Care (Post Inpatient/ED Visit) (Signed)
   01/02/2024  Name: Robert Shaw MRN: [id number] DOB: 1950-05-03  Today's TOC FU Call Status: Today's TOC FU Call Status:: Unsuccessful Call (3rd Attempt) Unsuccessful Call (3rd Attempt) Date: 01/02/24  Attempted to reach the patient regarding the most recent Inpatient/ED visit.  Follow Up Plan: No further outreach attempts will be made at this time. We have been unable to contact the patient.  Anquan Azzarello J. Ruel Dimmick RN, MSN [facility], [facility] RN Care Manager Direct Dial: [phone]  Fax: [phone] Website: [email or web address]

## 2024-01-06 ENCOUNTER — Other Ambulatory Visit: Payer: Self-pay | Admitting: Neuroradiology

## 2024-01-06 DIAGNOSIS — I671 Cerebral aneurysm, nonruptured: Secondary | ICD-10-CM

## 2024-01-06 HISTORY — PX: IR ANGIOGRAM FOLLOW UP STUDY: IMG697

## 2024-01-16 ENCOUNTER — Ambulatory Visit: Attending: Cardiovascular Disease

## 2024-01-16 ENCOUNTER — Ambulatory Visit: Payer: Self-pay | Admitting: Cardiovascular Disease

## 2024-01-16 LAB — CUP PACEART REMOTE DEVICE CHECK
Date Time Interrogation Session: 20251204003754
Implantable Pulse Generator Implant Date: 20250528

## 2024-01-22 NOTE — Addendum Note (Signed)
 Encounter addended by: Janice Lynwood BROCKS on: 01/22/2024 2:36 PM  Actions taken: Imaging Exam ended

## 2024-01-22 NOTE — Progress Notes (Signed)
 Remote Loop Recorder Transmission

## 2024-01-22 NOTE — Addendum Note (Signed)
 Encounter addended by: Janice Lynwood BROCKS on: 01/22/2024 2:35 PM  Actions taken: Imaging Exam ended

## 2024-01-28 ENCOUNTER — Encounter: Payer: Self-pay | Admitting: Neuroradiology

## 2024-01-28 NOTE — Addendum Note (Signed)
 Encounter addended by: Janice Lynwood BROCKS on: 01/28/2024 7:22 AM  Actions taken: Imaging Exam ended

## 2024-01-28 NOTE — Addendum Note (Signed)
 Encounter addended by: Janice Lynwood BROCKS on: 01/28/2024 8:00 AM  Actions taken: Imaging Exam ended, Charge Capture section accepted

## 2024-01-30 ENCOUNTER — Ambulatory Visit: Admitting: Neuroradiology

## 2024-01-30 ENCOUNTER — Encounter: Payer: Self-pay | Admitting: Neuroradiology

## 2024-01-30 VITALS — BP 121/70 | HR 67 | Temp 97.7°F | Ht 72.0 in | Wt 142.8 lb

## 2024-01-30 DIAGNOSIS — Z09 Encounter for follow-up examination after completed treatment for conditions other than malignant neoplasm: Secondary | ICD-10-CM

## 2024-01-30 DIAGNOSIS — I671 Cerebral aneurysm, nonruptured: Secondary | ICD-10-CM | POA: Diagnosis not present

## 2024-01-30 DIAGNOSIS — Q283 Other malformations of cerebral vessels: Secondary | ICD-10-CM

## 2024-01-30 NOTE — Progress Notes (Signed)
 I am seeing Robert Shaw today for a follow-up of a left frontoparietal dural arteriovenous fistula which was treated with transarterial embolization 12/30/2023.  He has recovered well from the procedure.  His neurologic exam is intact.  Radial puncture site is healed.  Normal pulse.  Femoral puncture site is healed.  Normal pulse.  We discussed the need for imaging follow-up to make certain there is no recurrence of the lesion.  This will be done with an MRA in 6 months.

## 2024-02-16 ENCOUNTER — Ambulatory Visit: Attending: Cardiovascular Disease

## 2024-02-16 DIAGNOSIS — I43 Cardiomyopathy in diseases classified elsewhere: Secondary | ICD-10-CM | POA: Diagnosis not present

## 2024-02-17 LAB — CUP PACEART REMOTE DEVICE CHECK
Date Time Interrogation Session: 20260103233031
Implantable Pulse Generator Implant Date: 20250528

## 2024-02-20 NOTE — Progress Notes (Signed)
 Remote Loop Recorder Transmission

## 2024-02-22 ENCOUNTER — Ambulatory Visit: Payer: Self-pay | Admitting: Cardiovascular Disease

## 2024-03-18 ENCOUNTER — Ambulatory Visit

## 2024-03-18 LAB — CUP PACEART REMOTE DEVICE CHECK
Date Time Interrogation Session: 20260203235256
Implantable Pulse Generator Implant Date: 20250528

## 2024-04-09 ENCOUNTER — Other Ambulatory Visit

## 2024-04-15 ENCOUNTER — Ambulatory Visit: Admitting: Urology

## 2024-04-18 ENCOUNTER — Ambulatory Visit

## 2024-05-19 ENCOUNTER — Ambulatory Visit

## 2024-06-19 ENCOUNTER — Ambulatory Visit

## 2024-07-20 ENCOUNTER — Ambulatory Visit
